# Patient Record
Sex: Female | Born: 1935 | Race: White | Hispanic: No | Smoking: Never smoker
Health system: Southern US, Community
[De-identification: ages and names within clinical notes are randomized; demographics above are authoritative.]

## PROBLEM LIST (undated history)

## (undated) DIAGNOSIS — K219 Gastro-esophageal reflux disease without esophagitis: Secondary | ICD-10-CM

## (undated) DIAGNOSIS — E785 Hyperlipidemia, unspecified: Secondary | ICD-10-CM

## (undated) DIAGNOSIS — J449 Chronic obstructive pulmonary disease, unspecified: Secondary | ICD-10-CM

## (undated) DIAGNOSIS — I4891 Unspecified atrial fibrillation: Secondary | ICD-10-CM

## (undated) DIAGNOSIS — M1990 Unspecified osteoarthritis, unspecified site: Secondary | ICD-10-CM

## (undated) DIAGNOSIS — T8859XA Other complications of anesthesia, initial encounter: Secondary | ICD-10-CM

## (undated) DIAGNOSIS — E119 Type 2 diabetes mellitus without complications: Secondary | ICD-10-CM

## (undated) DIAGNOSIS — H5440 Blindness, one eye, unspecified eye: Secondary | ICD-10-CM

## (undated) DIAGNOSIS — D649 Anemia, unspecified: Secondary | ICD-10-CM

## (undated) DIAGNOSIS — T4145XA Adverse effect of unspecified anesthetic, initial encounter: Secondary | ICD-10-CM

## (undated) DIAGNOSIS — G459 Transient cerebral ischemic attack, unspecified: Secondary | ICD-10-CM

## (undated) DIAGNOSIS — H9190 Unspecified hearing loss, unspecified ear: Secondary | ICD-10-CM

## (undated) DIAGNOSIS — S329XXA Fracture of unspecified parts of lumbosacral spine and pelvis, initial encounter for closed fracture: Secondary | ICD-10-CM

## (undated) DIAGNOSIS — N189 Chronic kidney disease, unspecified: Secondary | ICD-10-CM

## (undated) DIAGNOSIS — M4800 Spinal stenosis, site unspecified: Secondary | ICD-10-CM

## (undated) DIAGNOSIS — Z9981 Dependence on supplemental oxygen: Secondary | ICD-10-CM

## (undated) DIAGNOSIS — J189 Pneumonia, unspecified organism: Secondary | ICD-10-CM

## (undated) DIAGNOSIS — R1310 Dysphagia, unspecified: Secondary | ICD-10-CM

## (undated) HISTORY — PX: TONSILLECTOMY: SUR1361

## (undated) HISTORY — PX: OTHER SURGICAL HISTORY: SHX169

## (undated) HISTORY — PX: ABDOMINAL HYSTERECTOMY: SHX81

---

## 2013-02-05 DIAGNOSIS — H179 Unspecified corneal scar and opacity: Secondary | ICD-10-CM | POA: Insufficient documentation

## 2013-02-05 DIAGNOSIS — H18459 Nodular corneal degeneration, unspecified eye: Secondary | ICD-10-CM | POA: Insufficient documentation

## 2013-02-05 DIAGNOSIS — M316 Other giant cell arteritis: Secondary | ICD-10-CM | POA: Insufficient documentation

## 2013-05-01 DIAGNOSIS — M47817 Spondylosis without myelopathy or radiculopathy, lumbosacral region: Secondary | ICD-10-CM | POA: Insufficient documentation

## 2013-05-28 DIAGNOSIS — M810 Age-related osteoporosis without current pathological fracture: Secondary | ICD-10-CM | POA: Insufficient documentation

## 2013-09-12 ENCOUNTER — Other Ambulatory Visit (HOSPITAL_COMMUNITY): Payer: Self-pay | Admitting: Family Medicine

## 2013-09-12 DIAGNOSIS — R1310 Dysphagia, unspecified: Secondary | ICD-10-CM

## 2013-09-18 ENCOUNTER — Ambulatory Visit (HOSPITAL_COMMUNITY)
Admission: RE | Admit: 2013-09-18 | Discharge: 2013-09-18 | Disposition: A | Discharge status: Home / Self Care | Payer: Medicare Other | Source: Ambulatory Visit | Attending: Family Medicine | Admitting: Family Medicine

## 2013-09-18 DIAGNOSIS — R1312 Dysphagia, oropharyngeal phase: Secondary | ICD-10-CM | POA: Diagnosis not present

## 2013-09-18 DIAGNOSIS — R1313 Dysphagia, pharyngeal phase: Secondary | ICD-10-CM | POA: Diagnosis not present

## 2013-09-18 DIAGNOSIS — R1311 Dysphagia, oral phase: Secondary | ICD-10-CM | POA: Diagnosis not present

## 2013-09-18 DIAGNOSIS — R1310 Dysphagia, unspecified: Secondary | ICD-10-CM

## 2013-09-18 NOTE — Procedures (Signed)
Objective Swallowing Evaluation: Modified Barium Swallowing Study  Patient Details  Name: John Vasconcelos MRN: [id number] Date of Birth: 31-Mar-1925  Today's Date: 09/18/2013 Time: [id number] SLP Time Calculation (min): 24 min  Past Medical History: No past medical history on file. Past Surgical History: No past surgical history on file. HPI:  80 yo female with PMH significant for TIA, PNA, ventilator dependency (now overnight). She has been working on her oropharyngeal swallow with SLP at her ALF, and comes today hoping that she can upgrade her solids.     Assessment / Plan / Recommendation Clinical Impression  Dysphagia Diagnosis: Mild oral phase dysphagia;Mild pharyngeal phase dysphagia (suspect esophageal component) Clinical impression: Pt presents with a mild sensorimotor based oropharyngeal dysphagia with impaired mastication and reduced posterior transit with more solid PO including barium tablet. Pt demonstrated a mild delay in swallow initiation that primarily triggered at the valleculae except with thin liquids via straw, which spilled to the pyriform sinuses and was silently penetrated. Silent aspiration of thin liquids occurred during mixed consistency containing barium tablet and pill, with cough ineffective at clearing small amount of aspirates. Pt appeared to have slow transit through the esophagus, although MD was not present to confirm. Recommend to continue Dys 3 textures and thin liquids without straws, with meds crushed in puree. Pt and son verbalized their concern re: shredded meats at the ALF being too difficult to masticate; pt will likely require continued f/u with treating SLP to facilitate appropriate textures with the kitchen. Recommendations include diced or chopped meat instead of shredded and utilization of sauces/gravies to soften foods.    Treatment Recommendation       Diet Recommendation Dysphagia 3 (Mechanical Soft);Thin liquid   Liquid Administration via: Cup;No  straw Medication Administration: Crushed with puree Supervision: Patient able to self feed;Intermittent supervision to cue for compensatory strategies Compensations: Slow rate;Small sips/bites;Follow solids with liquid Postural Changes and/or Swallow Maneuvers: Seated upright 90 degrees;Upright 30-60 min after meal    Other  Recommendations Oral Care Recommendations: Oral care BID   Follow Up Recommendations  Other (comment) (continued f/u with current SLP)    Frequency and Duration        Pertinent Vitals/Pain n/a    SLP Swallow Goals     General Date of Onset:  (since end of April, beginning of May) HPI: 80 yo female with PMH significant for TIA, PNA, ventilator dependency (now overnight). She has been working on her oropharyngeal swallow with SLP at her ALF, and comes today hoping that she can upgrade her solids. Type of Study: Modified Barium Swallowing Study Reason for Referral: Objectively evaluate swallowing function Previous Swallow Assessment: Pt/son report FEES at [facility] ~6 weeks ago, and they do report that she had previously been on pureed food there Diet Prior to this Study: Dysphagia 3 (soft);Thin liquids;Other (Comment) (with ground or shredded meats) Respiratory Status: Room air History of Recent Intubation:  (intubated beginning of May with PNA) Behavior/Cognition: Alert;Cooperative;Pleasant mood;Requires cueing Oral Cavity - Dentition: Adequate natural dentition Oral Motor / Sensory Function: Impaired motor Oral impairment: Other (Comment) (difficulty with mastication) Self-Feeding Abilities: Able to feed self Patient Positioning: Upright in chair Baseline Vocal Quality: Clear Volitional Cough: Strong Volitional Swallow: Able to elicit Anatomy: Within functional limits Pharyngeal Secretions: Not observed secondary MBS    Reason for Referral Objectively evaluate swallowing function   Oral Phase Oral Preparation/Oral Phase Oral Phase: Impaired Oral -  Thin Oral - Thin Cup: Within functional limits Oral - Thin Straw: Within functional limits Oral -  Solids Oral - Puree: Within functional limits Oral - Mechanical Soft: Impaired mastication;Reduced posterior propulsion;Delayed oral transit Oral - Pill: Reduced posterior propulsion;Delayed oral transit   Pharyngeal Phase Pharyngeal Phase Pharyngeal Phase: Impaired Pharyngeal - Thin Pharyngeal - Thin Cup: Delayed swallow initiation;Premature spillage to valleculae;Reduced tongue base retraction;Penetration/Aspiration during swallow;Pharyngeal residue - valleculae Penetration/Aspiration details (thin cup): Material enters airway, remains ABOVE vocal cords then ejected out Pharyngeal - Thin Straw: Delayed swallow initiation;Premature spillage to pyriform sinuses;Reduced tongue base retraction;Penetration/Aspiration during swallow;Pharyngeal residue - valleculae Penetration/Aspiration details (thin straw): Material enters airway, passes BELOW cords without attempt by patient to eject out (silent aspiration) Pharyngeal - Solids Pharyngeal - Puree: Delayed swallow initiation;Reduced tongue base retraction;Pharyngeal residue - valleculae;Compensatory strategies attempted (Comment) (chin tuck not effective at reducing residuals) Pharyngeal - Mechanical Soft: Delayed swallow initiation;Reduced tongue base retraction;Pharyngeal residue - valleculae Pharyngeal - Pill: Delayed swallow initiation;Reduced tongue base retraction;Pharyngeal residue - valleculae;Other (Comment) (needed puree to clear pill from valleculae)  Cervical Esophageal Phase    GO    Cervical Esophageal Phase Cervical Esophageal Phase:  (appeared to have slow transit in esophagus (MD not present))    Functional Assessment Tool Used: skilled clinical judgment Functional Limitations: Swallowing Swallow Current Status ([id number]): At least 20 percent but less than 40 percent impaired, limited or restricted Swallow Goal Status [phone]):  At least 20 percent but less than 40 percent impaired, limited or restricted Swallow Discharge Status [phone]([id number]): At least 20 percent but less than 40 percent impaired, limited or restricted      Maxcine HamLaura Paiewonsky, M.A. CCC-SLP [phone]  Maxcine Hamaiewonsky, Yuridia Couts 09/18/2013, 12:32 PM

## 2013-11-21 DIAGNOSIS — J189 Pneumonia, unspecified organism: Secondary | ICD-10-CM

## 2013-11-21 HISTORY — DX: Pneumonia, unspecified organism: J18.9

## 2013-12-26 DIAGNOSIS — J690 Pneumonitis due to inhalation of food and vomit: Secondary | ICD-10-CM | POA: Insufficient documentation

## 2014-04-12 ENCOUNTER — Emergency Department (HOSPITAL_BASED_OUTPATIENT_CLINIC_OR_DEPARTMENT_OTHER)
Admission: EM | Admit: 2014-04-12 | Discharge: 2014-04-13 | Disposition: A | Discharge status: Other Acute Inpatient Hospital | Payer: Medicare Other | Attending: Emergency Medicine | Admitting: Emergency Medicine

## 2014-04-12 ENCOUNTER — Encounter (HOSPITAL_BASED_OUTPATIENT_CLINIC_OR_DEPARTMENT_OTHER): Payer: Self-pay | Admitting: Emergency Medicine

## 2014-04-12 ENCOUNTER — Emergency Department (HOSPITAL_BASED_OUTPATIENT_CLINIC_OR_DEPARTMENT_OTHER): Payer: Medicare Other

## 2014-04-12 DIAGNOSIS — A419 Sepsis, unspecified organism: Secondary | ICD-10-CM

## 2014-04-12 DIAGNOSIS — N390 Urinary tract infection, site not specified: Secondary | ICD-10-CM

## 2014-04-12 DIAGNOSIS — R531 Weakness: Secondary | ICD-10-CM | POA: Diagnosis present

## 2014-04-12 DIAGNOSIS — H9190 Unspecified hearing loss, unspecified ear: Secondary | ICD-10-CM | POA: Diagnosis not present

## 2014-04-12 DIAGNOSIS — Z8701 Personal history of pneumonia (recurrent): Secondary | ICD-10-CM | POA: Insufficient documentation

## 2014-04-12 DIAGNOSIS — A499 Bacterial infection, unspecified: Secondary | ICD-10-CM

## 2014-04-12 HISTORY — DX: Unspecified hearing loss, unspecified ear: H91.90

## 2014-04-12 HISTORY — DX: Pneumonia, unspecified organism: J18.9

## 2014-04-12 LAB — BASIC METABOLIC PANEL
ANION GAP: 6 (ref 5–15)
BUN: 32 mg/dL — ABNORMAL HIGH (ref 6–23)
CO2: 23 mmol/L (ref 19–32)
Calcium: 8.3 mg/dL — ABNORMAL LOW (ref 8.4–10.5)
Chloride: 102 mmol/L (ref 96–112)
Creatinine, Ser: 1.3 mg/dL — ABNORMAL HIGH (ref 0.50–1.10)
GFR calc non Af Amer: 36 mL/min — ABNORMAL LOW (ref >90)
GFR, EST AFRICAN AMERICAN: 42 mL/min — AB (ref >90)
GLUCOSE: 116 mg/dL — AB (ref 70–99)
Potassium: 3.6 mmol/L (ref 3.5–5.1)
Sodium: 131 mmol/L — ABNORMAL LOW (ref 135–145)

## 2014-04-12 LAB — URINALYSIS, ROUTINE W REFLEX MICROSCOPIC
Glucose, UA: NEGATIVE mg/dL
KETONES UR: 15 mg/dL — AB
Nitrite: POSITIVE — AB
Protein, ur: >300 mg/dL — AB
Specific Gravity, Urine: 1.023 (ref 1.005–1.030)
UROBILINOGEN UA: 1 mg/dL (ref 0.0–1.0)
pH: 7 (ref 5.0–8.0)

## 2014-04-12 LAB — CBC WITH DIFFERENTIAL/PLATELET
Band Neutrophils: 3 % (ref 0–10)
Basophils Absolute: 0 10*3/uL (ref 0.0–0.1)
Basophils Relative: 0 % (ref 0–1)
Blasts: 0 %
Eosinophils Absolute: 0 10*3/uL (ref 0.0–0.7)
Eosinophils Relative: 0 % (ref 0–5)
HCT: 36.2 % (ref 36.0–46.0)
Hemoglobin: 11.7 g/dL — ABNORMAL LOW (ref 12.0–15.0)
LYMPHS ABS: 1.9 10*3/uL (ref 0.7–4.0)
Lymphocytes Relative: 11 % — ABNORMAL LOW (ref 12–46)
MCH: 31 pg (ref 26.0–34.0)
MCHC: 32.3 g/dL (ref 30.0–36.0)
MCV: 96 fL (ref 78.0–100.0)
MONOS PCT: 12 % (ref 3–12)
Metamyelocytes Relative: 0 %
Monocytes Absolute: 2.1 10*3/uL — ABNORMAL HIGH (ref 0.1–1.0)
Myelocytes: 0 %
Neutro Abs: 13.4 10*3/uL — ABNORMAL HIGH (ref 1.7–7.7)
Neutrophils Relative %: 74 % (ref 43–77)
PLATELETS: 107 10*3/uL — AB (ref 150–400)
Promyelocytes Absolute: 0 %
RBC: 3.77 MIL/uL — ABNORMAL LOW (ref 3.87–5.11)
RDW: 16.8 % — AB (ref 11.5–15.5)
WBC: 17.4 10*3/uL — AB (ref 4.0–10.5)
nRBC: 0 /100 WBC

## 2014-04-12 LAB — URINE MICROSCOPIC-ADD ON

## 2014-04-12 MED ORDER — DEXTROSE 5 % IV SOLN
1.0000 g | Freq: Once | INTRAVENOUS | Status: AC
  Administered 2014-04-13: 1 g via INTRAVENOUS

## 2014-04-12 MED ORDER — ACETAMINOPHEN 160 MG/5ML PO SOLN
650.0000 mg | Freq: Once | ORAL | Status: AC
  Administered 2014-04-12: 650 mg via ORAL
  Filled 2014-04-12: qty 20.3

## 2014-04-12 MED ORDER — SODIUM CHLORIDE 0.9 % IV SOLN
Freq: Once | INTRAVENOUS | Status: AC
  Administered 2014-04-12: via INTRAVENOUS

## 2014-04-12 NOTE — ED Notes (Signed)
Patient's son states she is on o2 at night on 2 ltrs., and as needed throughout day.

## 2014-04-12 NOTE — ED Notes (Signed)
Back from xray

## 2014-04-12 NOTE — ED Notes (Signed)
I placed cannula on patient and tape on O2 pulse ox. Probe. I get best reading of 91% O2 so I raised to 3 ltrs. Oxygen by cannula. Patient's son stated they facility where patient lives took temp and hour ago, and was getting 104.0 axillary.

## 2014-04-12 NOTE — ED Notes (Signed)
D/c'd from hospital on 2/9 after pneumonia. Seen by PCP this week and was informed Tuesday that "blood work was OK". Has appointment with pulmonologist on Monday. "Here tonight for general weakness. Concern for UTI". Pt is in [facility] and has been working with rehab and has been improving daily until today. Was using her walker to go to the b/r, became weak and fell back onto bottom. Hit R ankle on her her walker. R medial ankle bruise noted. C/o "usual chronic tailbone pain".  Alert, NAD, calm, interactive, resps e/u, shallow. (denies: cough, fever, nvd or dizziness).

## 2014-04-12 NOTE — ED Notes (Signed)
Son reports mother had fallen about 45 min ago pt denies pain from fall but reports chronic pain in buttocks unchanged. [facility] is [facility]

## 2014-04-12 NOTE — ED Provider Notes (Addendum)
CSN: [id number]     Arrival date & time 04/12/14  2106 History  This chart was scribed for Hanley Seamen, MD by Jarvis Morgan, ED Scribe. This patient was seen in room MH03/MH03 and the patient's care was started at 11:58 PM.     Chief Complaint  Patient presents with  . Weakness    The history is provided by the patient and a relative. No language interpreter was used.    HPI Comments: Julie Richardson is a 81 y.o. female who presents to the Emergency Department complaining of generalized weakness which causes a fall earlier today. Son state she was at her assisted living facility and got up to go the bathroom and became weak fell. Son has a concern UTI as she has been having urinary urgency, voiding small amounts of urine, and low grade fever. Pt was hospitalized on 04/01/14 for pneumonia and it currently taking azithromycin per son. She has also been having a cough and occasional vomiting. She has appt with pulmonologist on Monday. She denies any lightheadeness   Past Medical History  Diagnosis Date  . Pneumonia   . HOH (hard of hearing)    History reviewed. No pertinent past surgical history. History reviewed. No pertinent family history. History  Substance Use Topics  . Smoking status: Never Smoker   . Smokeless tobacco: Never Used  . Alcohol Use: No   OB History    No data available     Review of Systems  All other systems reviewed and are negative.     Allergies  Coumadin and Sulfa antibiotics  Home Medications   Prior to Admission medications   Not on File   Triage Vitals: BP 107/68 mmHg  Pulse 106  Temp(Src) 98.7 F (37.1 C) (Oral)  Resp 20  Ht  (1.499 m)  Wt 97 lb (43.999 kg)  BMI 19.58 kg/m2  SpO2 97%   Physical Exam  Nursing note and vitals reviewed. General: Well-developed, well-nourished female in no acute distress; appearance consistent with age of record HENT: normocephalic; resolving hermatoma or left supraorbital region. Right pupil PERRL.  Left pupil fixed. Right lens inplant. Extraocular muscles appear intact Eyes: pupils equal, round and reactive to light; extraocular muscles intact Neck: supple Heart: regular rate and rhythm Lungs: clear to auscultation bilaterally. Basilar rales Abdomen: soft; nondistended; nontender; no masses or hepatosplenomegaly; bowel sounds present Extremities: No deformity; full range of motion; pulses weak Neurologic: Awake, alert and oriented; motor function intact in all extremities and symmetric; no facial droop Skin: Warm and dry. Chronic appearing hyperpigmentation and hyperkeratosis of lower legs Psychiatric: Normal mood and affect  ED Course  Procedures (including critical care time)  DIAGNOSTIC STUDIES: Oxygen Saturation is 97% on RA, normal by my interpretation.    COORDINATION OF CARE: 12:07 AM- Will order CXR and diagnostic lab work. Pt advised of plan for treatment and pt agrees.   CRITICAL CARE Performed by: Paula Libra L Total critical care time: 40 minutes Critical care time was exclusive of separately billable procedures and treating other patients. Critical care was necessary to treat or prevent imminent or life-threatening deterioration. Critical care was time spent personally by me on the following activities: development of treatment plan with patient and/or surrogate as well as nursing, discussions with consultants, evaluation of patient's response to treatment, examination of patient, obtaining history from patient or surrogate, ordering and performing treatments and interventions, ordering and review of laboratory studies, ordering and review of radiographic studies, pulse oximetry and re-evaluation of patient's  condition.    MDM   Nursing notes and vitals signs, including pulse oximetry, reviewed.  Summary of this visit's results, reviewed by myself:  Labs:  Results for orders placed or performed during the hospital encounter of 04/12/14 (from the past 24  hour(s))  CBC with Differential     Status: Abnormal   Collection Time: 04/12/14 10:39 PM  Result Value Ref Range   WBC 17.4 (H) 4.0 - 10.5 K/uL   RBC 3.77 (L) 3.87 - 5.11 MIL/uL   Hemoglobin 11.7 (L) 12.0 - 15.0 g/dL   HCT 82.936.2 56.236.0 - 13.046.0 %   MCV 96.0 78.0 - 100.0 fL   MCH 31.0 26.0 - 34.0 pg   MCHC 32.3 30.0 - 36.0 g/dL   RDW 86.516.8 (H) 78.411.5 - 69.615.5 %   Platelets 107 (L) 150 - 400 K/uL   Neutrophils Relative % 74 43 - 77 %   Lymphocytes Relative 11 (L) 12 - 46 %   Monocytes Relative 12 3 - 12 %   Eosinophils Relative 0 0 - 5 %   Basophils Relative 0 0 - 1 %   Band Neutrophils 3 0 - 10 %   Metamyelocytes Relative 0 %   Myelocytes 0 %   Promyelocytes Absolute 0 %   Blasts 0 %   nRBC 0 0 /100 WBC   Neutro Abs 13.4 (H) 1.7 - 7.7 K/uL   Lymphs Abs 1.9 0.7 - 4.0 K/uL   Monocytes Absolute 2.1 (H) 0.1 - 1.0 K/uL   Eosinophils Absolute 0.0 0.0 - 0.7 K/uL   Basophils Absolute 0.0 0.0 - 0.1 K/uL   WBC Morphology TOXIC GRANULATION    Smear Review LARGE PLATELETS PRESENT   Basic metabolic panel     Status: Abnormal   Collection Time: 04/12/14 10:39 PM  Result Value Ref Range   Sodium 131 (L) 135 - 145 mmol/L   Potassium 3.6 3.5 - 5.1 mmol/L   Chloride 102 96 - 112 mmol/L   CO2 23 19 - 32 mmol/L   Glucose, Bld 116 (H) 70 - 99 mg/dL   BUN 32 (H) 6 - 23 mg/dL   Creatinine, Ser 2.951.30 (H) 0.50 - 1.10 mg/dL   Calcium 8.3 (L) 8.4 - 10.5 mg/dL   GFR calc non Af Amer 36 (L) >90 mL/min   GFR calc Af Amer 42 (L) >90 mL/min   Anion gap 6 5 - 15  Urinalysis, Routine w reflex microscopic     Status: Abnormal   Collection Time: 04/12/14 10:40 PM  Result Value Ref Range   Color, Urine RED (A) YELLOW   APPearance TURBID (A) CLEAR   Specific Gravity, Urine 1.023 1.005 - 1.030   pH 7.0 5.0 - 8.0   Glucose, UA NEGATIVE NEGATIVE mg/dL   Hgb urine dipstick LARGE (A) NEGATIVE   Bilirubin Urine LARGE (A) NEGATIVE   Ketones, ur 15 (A) NEGATIVE mg/dL   Protein, ur >284>300 (A) NEGATIVE mg/dL    Urobilinogen, UA 1.0 0.0 - 1.0 mg/dL   Nitrite POSITIVE (A) NEGATIVE   Leukocytes, UA LARGE (A) NEGATIVE  Urine microscopic-add on     Status: Abnormal   Collection Time: 04/12/14 10:40 PM  Result Value Ref Range   WBC, UA TOO NUMEROUS TO COUNT <3 WBC/hpf   RBC / HPF TOO NUMEROUS TO COUNT <3 RBC/hpf   Bacteria, UA MANY (A) RARE  I-Stat CG4 Lactic Acid, ED     Status: Abnormal   Collection Time: 04/13/14 12:35 AM  Result Value  Ref Range   Lactic Acid, Venous 3.23 (HH) 0.5 - 2.0 mmol/L   Comment NOTIFIED PHYSICIAN   I-Stat CG4 Lactic Acid, ED     Status: None   Collection Time: 04/13/14  3:19 AM  Result Value Ref Range   Lactic Acid, Venous 1.32 0.5 - 2.0 mmol/L    Imaging Studies: Dg Chest 2 View  04/12/2014   CLINICAL DATA:  D/c'd on 2/9 from hospital after pneumonia. General weakness. Fell back onto bottom today due to weakness. Hx: Pneumonia  EXAM: CHEST  2 VIEW  COMPARISON:  03/28/2014  FINDINGS: Areas of opacity in bronchial wall thickening in the medial lung bases are improved from the prior exam.  There are no areas of lung consolidation. Mild bilateral interstitial thickening and areas of mild reticular scarring are stable. No pulmonary edema.  Cardiac silhouette is mildly enlarged. The aorta is uncoiled. No mediastinal or hilar masses.  No pleural effusion or pneumothorax.  Mild compression fractures noted of a mid and a lower thoracic vertebrae stable from the recent prior chest CT.  IMPRESSION: No acute findings. Lung base infiltrates noted previously appear improved. Residual opacity is likely combination of scarring, atelectasis and chronic bronchitic change. No new abnormalities.   Electronically Signed   By: Amie Portland M.D.   On: 04/12/2014 23:48   3:31 AM Pressure improved and lactate has normalized after IV fluid hydration. Rocephin given for urinary tract infection. Dr. Lucretia Roers accepts for admission to [facility]. Suspect early urosepsis.  I personally  performed the services described in this documentation, which was scribed in my presence. The recorded information has been reviewed and is accurate.   Hanley Seamen, MD 04/13/14 1610  Hanley Seamen, MD 04/13/14 9604  Hanley Seamen, MD 04/13/14 [phone]

## 2014-04-13 DIAGNOSIS — R531 Weakness: Secondary | ICD-10-CM | POA: Diagnosis present

## 2014-04-13 DIAGNOSIS — H9190 Unspecified hearing loss, unspecified ear: Secondary | ICD-10-CM | POA: Diagnosis not present

## 2014-04-13 DIAGNOSIS — A419 Sepsis, unspecified organism: Secondary | ICD-10-CM | POA: Diagnosis not present

## 2014-04-13 DIAGNOSIS — N390 Urinary tract infection, site not specified: Secondary | ICD-10-CM | POA: Diagnosis not present

## 2014-04-13 DIAGNOSIS — Z8701 Personal history of pneumonia (recurrent): Secondary | ICD-10-CM | POA: Diagnosis not present

## 2014-04-13 LAB — I-STAT CG4 LACTIC ACID, ED
LACTIC ACID, VENOUS: 3.23 mmol/L — AB (ref 0.5–2.0)
Lactic Acid, Venous: 1.32 mmol/L (ref 0.5–2.0)

## 2014-04-13 MED ORDER — SODIUM CHLORIDE 0.9 % IV BOLUS (SEPSIS)
1000.0000 mL | Freq: Once | INTRAVENOUS | Status: AC
  Administered 2014-04-13: 500 mL via INTRAVENOUS

## 2014-04-13 MED ORDER — CEFTRIAXONE SODIUM 1 G IJ SOLR
INTRAMUSCULAR | Status: AC
  Filled 2014-04-13: qty 10

## 2014-04-13 NOTE — ED Notes (Signed)
No changes, [facility] here for transport, son present.

## 2014-04-15 LAB — URINE CULTURE

## 2014-04-16 ENCOUNTER — Telehealth (HOSPITAL_BASED_OUTPATIENT_CLINIC_OR_DEPARTMENT_OTHER): Payer: Self-pay | Admitting: Emergency Medicine

## 2014-04-16 NOTE — Telephone Encounter (Addendum)
Post ED Visit - Positive Culture Follow-up  Culture report reviewed by antimicrobial stewardship pharmacist: []  Wes Dulaney, Pharm.D., BCPS [x]  Celedonio MiyamotoJeremy Frens, Pharm.D., BCPS []  Georgina PillionElizabeth Martin, Pharm.D., BCPS []  KyleMinh Pham, Vermont[place]arm.D., BCPS, AAHIVP []  Estella HuskMichelle Turner, Pharm.D., BCPS, AAHIVP []  Elder CyphersLorie Poole, [address]Ph[address]D., BCPS  Positive urine culture E. coli Treated with none,  Pt [facility] [place] pharmacist aware, no further followup  Berle MullMiller, Seamus Warehime 04/16/2014, 10:14 AM

## 2014-04-26 ENCOUNTER — Encounter (HOSPITAL_BASED_OUTPATIENT_CLINIC_OR_DEPARTMENT_OTHER): Payer: Self-pay | Admitting: *Deleted

## 2014-04-26 ENCOUNTER — Emergency Department (HOSPITAL_BASED_OUTPATIENT_CLINIC_OR_DEPARTMENT_OTHER)
Admission: EM | Admit: 2014-04-26 | Discharge: 2014-04-26 | Disposition: A | Discharge status: Other Acute Inpatient Hospital | Payer: Medicare Other | Attending: Emergency Medicine | Admitting: Emergency Medicine

## 2014-04-26 ENCOUNTER — Emergency Department (HOSPITAL_BASED_OUTPATIENT_CLINIC_OR_DEPARTMENT_OTHER): Payer: Medicare Other

## 2014-04-26 DIAGNOSIS — N10 Acute tubulo-interstitial nephritis: Secondary | ICD-10-CM | POA: Diagnosis not present

## 2014-04-26 DIAGNOSIS — R4182 Altered mental status, unspecified: Secondary | ICD-10-CM | POA: Diagnosis not present

## 2014-04-26 DIAGNOSIS — Z792 Long term (current) use of antibiotics: Secondary | ICD-10-CM | POA: Insufficient documentation

## 2014-04-26 DIAGNOSIS — H9190 Unspecified hearing loss, unspecified ear: Secondary | ICD-10-CM | POA: Insufficient documentation

## 2014-04-26 DIAGNOSIS — W1830XA Fall on same level, unspecified, initial encounter: Secondary | ICD-10-CM | POA: Insufficient documentation

## 2014-04-26 DIAGNOSIS — R000 Tachycardia, unspecified: Secondary | ICD-10-CM | POA: Diagnosis not present

## 2014-04-26 DIAGNOSIS — Y9289 Other specified places as the place of occurrence of the external cause: Secondary | ICD-10-CM | POA: Diagnosis not present

## 2014-04-26 DIAGNOSIS — Z79899 Other long term (current) drug therapy: Secondary | ICD-10-CM | POA: Diagnosis not present

## 2014-04-26 DIAGNOSIS — Y9389 Activity, other specified: Secondary | ICD-10-CM | POA: Diagnosis not present

## 2014-04-26 DIAGNOSIS — Z8701 Personal history of pneumonia (recurrent): Secondary | ICD-10-CM | POA: Diagnosis not present

## 2014-04-26 DIAGNOSIS — A419 Sepsis, unspecified organism: Secondary | ICD-10-CM | POA: Diagnosis not present

## 2014-04-26 DIAGNOSIS — Y998 Other external cause status: Secondary | ICD-10-CM | POA: Diagnosis not present

## 2014-04-26 DIAGNOSIS — Z7952 Long term (current) use of systemic steroids: Secondary | ICD-10-CM | POA: Insufficient documentation

## 2014-04-26 DIAGNOSIS — Z043 Encounter for examination and observation following other accident: Secondary | ICD-10-CM | POA: Diagnosis present

## 2014-04-26 LAB — CBC WITH DIFFERENTIAL/PLATELET
BASOS ABS: 0.1 10*3/uL (ref 0.0–0.1)
BLASTS: 0 %
Band Neutrophils: 0 % (ref 0–10)
Basophils Relative: 1 % (ref 0–1)
Eosinophils Absolute: 0.1 10*3/uL (ref 0.0–0.7)
Eosinophils Relative: 1 % (ref 0–5)
HCT: 32.4 % — ABNORMAL LOW (ref 36.0–46.0)
HEMOGLOBIN: 9.7 g/dL — AB (ref 12.0–15.0)
LYMPHS ABS: 1 10*3/uL (ref 0.7–4.0)
LYMPHS PCT: 12 % (ref 12–46)
MCH: 29.1 pg (ref 26.0–34.0)
MCHC: 29.9 g/dL — AB (ref 30.0–36.0)
MCV: 97.3 fL (ref 78.0–100.0)
MONOS PCT: 15 % — AB (ref 3–12)
Metamyelocytes Relative: 0 %
Monocytes Absolute: 1.3 10*3/uL — ABNORMAL HIGH (ref 0.1–1.0)
Myelocytes: 0 %
Neutro Abs: 5.9 10*3/uL (ref 1.7–7.7)
Neutrophils Relative %: 71 % (ref 43–77)
PLATELETS: 193 10*3/uL (ref 150–400)
PROMYELOCYTES ABS: 0 %
RBC: 3.33 MIL/uL — ABNORMAL LOW (ref 3.87–5.11)
RDW: 16.4 % — AB (ref 11.5–15.5)
WBC: 8.4 10*3/uL (ref 4.0–10.5)
nRBC: 0 /100 WBC

## 2014-04-26 LAB — URINALYSIS, ROUTINE W REFLEX MICROSCOPIC
Bilirubin Urine: NEGATIVE
Glucose, UA: NEGATIVE mg/dL
Ketones, ur: 15 mg/dL — AB
Nitrite: POSITIVE — AB
Protein, ur: >300 mg/dL — AB
Specific Gravity, Urine: 1.021 (ref 1.005–1.030)
Urobilinogen, UA: 0.2 mg/dL (ref 0.0–1.0)
pH: 5.5 (ref 5.0–8.0)

## 2014-04-26 LAB — COMPREHENSIVE METABOLIC PANEL
ALBUMIN: 3.4 g/dL — AB (ref 3.5–5.2)
ALT: 27 U/L (ref 0–35)
AST: 41 U/L — ABNORMAL HIGH (ref 0–37)
Alkaline Phosphatase: 117 U/L (ref 39–117)
Anion gap: 6 (ref 5–15)
BUN: 28 mg/dL — ABNORMAL HIGH (ref 6–23)
CALCIUM: 8.6 mg/dL (ref 8.4–10.5)
CHLORIDE: 104 mmol/L (ref 96–112)
CO2: 25 mmol/L (ref 19–32)
Creatinine, Ser: 0.75 mg/dL (ref 0.50–1.10)
GFR calc non Af Amer: 75 mL/min — ABNORMAL LOW (ref >90)
GFR, EST AFRICAN AMERICAN: 86 mL/min — AB (ref >90)
GLUCOSE: 133 mg/dL — AB (ref 70–99)
POTASSIUM: 4.2 mmol/L (ref 3.5–5.1)
Sodium: 135 mmol/L (ref 135–145)
TOTAL PROTEIN: 7.9 g/dL (ref 6.0–8.3)
Total Bilirubin: 1.2 mg/dL (ref 0.3–1.2)

## 2014-04-26 LAB — URINE MICROSCOPIC-ADD ON

## 2014-04-26 LAB — TROPONIN I: TROPONIN I: 0.04 ng/mL — AB (ref <0.031)

## 2014-04-26 LAB — AMMONIA: Ammonia: 23 umol/L (ref 11–32)

## 2014-04-26 LAB — I-STAT CG4 LACTIC ACID, ED: LACTIC ACID, VENOUS: 2.32 mmol/L — AB (ref 0.5–2.0)

## 2014-04-26 LAB — CBG MONITORING, ED: GLUCOSE-CAPILLARY: 124 mg/dL — AB (ref 70–99)

## 2014-04-26 MED ORDER — VANCOMYCIN HCL IN DEXTROSE 1-5 GM/200ML-% IV SOLN
1000.0000 mg | Freq: Once | INTRAVENOUS | Status: AC
  Administered 2014-04-26: 1000 mg via INTRAVENOUS
  Filled 2014-04-26: qty 200

## 2014-04-26 MED ORDER — PIPERACILLIN-TAZOBACTAM 3.375 G IVPB 30 MIN
3.3750 g | Freq: Once | INTRAVENOUS | Status: AC
  Administered 2014-04-26: 3.375 g via INTRAVENOUS
  Filled 2014-04-26 (×2): qty 50

## 2014-04-26 MED ORDER — SODIUM CHLORIDE 0.9 % IV BOLUS (SEPSIS)
1000.0000 mL | Freq: Once | INTRAVENOUS | Status: AC
  Administered 2014-04-26: 1000 mL via INTRAVENOUS

## 2014-04-26 MED ORDER — ACETAMINOPHEN 325 MG PO TABS
650.0000 mg | ORAL_TABLET | Freq: Once | ORAL | Status: AC
  Administered 2014-04-26: 650 mg via ORAL
  Filled 2014-04-26: qty 2

## 2014-04-26 NOTE — ED Notes (Signed)
Pt had unwitness fall this am  Pt denies inj or pain ( Hematoma over left eye is old)  No loc,  Also has had n/v/d x 2 days per pt   Per nursing home staff pt is at baseline

## 2014-04-26 NOTE — ED Notes (Signed)
Arrived ems w c/o unwitnessed fall,  Denies loc, denies pain  Has had n/v/d x 2 days

## 2014-04-26 NOTE — ED Provider Notes (Signed)
CSN: [id number]     Arrival date & time 04/26/14  5784 History   First MD Initiated Contact with Patient 04/26/14 [phone]     Chief Complaint  Patient presents with  . Fall     (Consider location/radiation/quality/duration/timing/severity/associated sxs/prior Treatment) Patient is a 81 y.o. female presenting with fall.  Fall This is a new problem. The current episode started 1 to 2 hours ago. The problem occurs constantly. The problem has not changed since onset.Pertinent negatives include no chest pain, no abdominal pain, no headaches and no shortness of breath. Nothing aggravates the symptoms. Nothing relieves the symptoms. She has tried nothing for the symptoms.    Past Medical History  Diagnosis Date  . Pneumonia   . HOH (hard of hearing)    History reviewed. No pertinent past surgical history. No family history on file. History  Substance Use Topics  . Smoking status: Never Smoker   . Smokeless tobacco: Never Used  . Alcohol Use: No   OB History    No data available     Review of Systems  Respiratory: Negative for shortness of breath.   Cardiovascular: Negative for chest pain.  Gastrointestinal: Negative for abdominal pain.  Neurological: Negative for headaches.  All other systems reviewed and are negative.     Allergies  Coumadin and Sulfa antibiotics  Home Medications   Prior to Admission medications   Medication Sig Start Date End Date Taking? Authorizing Provider  acetaminophen (TYLENOL) 325 MG tablet Take 650 mg by mouth every 6 (six) hours as needed.   Yes Historical Provider, MD  azithromycin (ZITHROMAX) 250 MG tablet Take by mouth daily.   Yes Historical Provider, MD  erythromycin ophthalmic ointment 1 application at bedtime.   Yes Historical Provider, MD  HYDROcodone-acetaminophen (NORCO/VICODIN) 5-325 MG per tablet Take 1 tablet by mouth every 6 (six) hours as needed for moderate pain.   Yes Historical Provider, MD  hydrocortisone (ANUSOL-HC) 2.5 %  rectal cream Place 1 application rectally 2 (two) times daily.   Yes Historical Provider, MD  iron polysaccharides (NIFEREX) 150 MG capsule Take 150 mg by mouth daily.   Yes Historical Provider, MD  pantoprazole (PROTONIX) 40 MG tablet Take 40 mg by mouth daily.   Yes Historical Provider, MD  predniSONE (DELTASONE) 5 MG tablet Take 5 mg by mouth daily with breakfast.   Yes Historical Provider, MD   BP 89/46 mmHg  Pulse 94  Temp(Src) 101.7 F (38.7 C) (Rectal)  Resp 18  Ht 5' (1.524 m)  Wt 100 lb (45.36 kg)  BMI 19.53 kg/m2  SpO2 94% Physical Exam  Constitutional: She appears well-developed and well-nourished.  HENT:  Head: Normocephalic and atraumatic.  Right Ear: External ear normal.  Left Ear: External ear normal.  Eyes: Conjunctivae and EOM are normal. Pupils are equal, round, and reactive to light.  Neck: Normal range of motion. Neck supple.  Cardiovascular: Regular rhythm, normal heart sounds and intact distal pulses.  Tachycardia present.   Pulmonary/Chest: Effort normal and breath sounds normal.  Abdominal: Soft. Bowel sounds are normal. There is no tenderness.  Musculoskeletal: Normal range of motion.  Neurological: She is alert.  No deficits in context of limited exam  Skin: Skin is warm and dry.  Vitals reviewed.   ED Course  Procedures (including critical care time) Labs Review Labs Reviewed  COMPREHENSIVE METABOLIC PANEL - Abnormal; Notable for the following:    Glucose, Bld 133 (*)    BUN 28 (*)    Albumin 3.4 (*)  AST 41 (*)    GFR calc non Af Amer 75 (*)    GFR calc Af Amer 86 (*)    All other components within normal limits  URINALYSIS, ROUTINE W REFLEX MICROSCOPIC - Abnormal; Notable for the following:    Color, Urine AMBER (*)    APPearance CLOUDY (*)    Hgb urine dipstick LARGE (*)    Ketones, ur 15 (*)    Protein, ur >300 (*)    Nitrite POSITIVE (*)    Leukocytes, UA MODERATE (*)    All other components within normal limits  TROPONIN I -  Abnormal; Notable for the following:    Troponin I 0.04 (*)    All other components within normal limits  URINE MICROSCOPIC-ADD ON - Abnormal; Notable for the following:    Bacteria, UA MANY (*)    All other components within normal limits  CBC WITH DIFFERENTIAL/PLATELET - Abnormal; Notable for the following:    RBC 3.33 (*)    Hemoglobin 9.7 (*)    HCT 32.4 (*)    MCHC 29.9 (*)    RDW 16.4 (*)    Monocytes Relative 15 (*)    Monocytes Absolute 1.3 (*)    All other components within normal limits  CBG MONITORING, ED - Abnormal; Notable for the following:    Glucose-Capillary 124 (*)    All other components within normal limits  I-STAT CG4 LACTIC ACID, ED - Abnormal; Notable for the following:    Lactic Acid, Venous 2.32 (*)    All other components within normal limits  URINE CULTURE  CULTURE, BLOOD (ROUTINE X 2)  CULTURE, BLOOD (ROUTINE X 2)  AMMONIA  CBC WITH DIFFERENTIAL/PLATELET  GI PATHOGEN PANEL BY PCR, STOOL    Imaging Review Dg Chest 2 View  04/26/2014   CLINICAL DATA:  Patient status post fall.  No known injury.  EXAM: CHEST  2 VIEW  COMPARISON:  Chest radiograph 04/12/2014  FINDINGS: Stable cardiac and mediastinal contours with tortuosity of the thoracic aorta. Unchanged right mid lung opacity. Persistent blunting of the right costophrenic angle. No definite pleural effusion or pneumothorax. Unchanged wedge compression deformity of multiple mid thoracic vertebral bodies.  IMPRESSION: Persistent right midlung opacity may represent resolving infiltrate or potentially scarring.  Re- demonstrated compression deformities of multiple mid and lower thoracic vertebral bodies.  Probable small right pleural effusion.   Electronically Signed   By: Annia Belt M.D.   On: 04/26/2014 09:39   Ct Head Wo Contrast  04/26/2014   CLINICAL DATA:  Fall today. Head injury. Confusion. Initial encounter.  EXAM: CT HEAD WITHOUT CONTRAST  TECHNIQUE: Contiguous axial images were obtained from the base  of the skull through the vertex without intravenous contrast.  COMPARISON:  03/28/2014.  11/08/2013.  FINDINGS: No mass lesion, mass effect, midline shift, hydrocephalus, hemorrhage. No acute territorial cortical ischemia/infarct. Atrophy and chronic ischemic white matter disease is present. Old lacunar infarct in the RIGHT caudate head. Dense benign basal ganglia calcifications. Dense atherosclerosis. Chronic paranasal sinus disease with complete opacification of the maxillary sinuses. Calcifications are present along with high density material, suggesting allergic fungal sinusitis.  Soft tissue swelling is present over the LEFT supraorbital region, possibly representing contusion. This could also represent scar tissue from prior injury.  IMPRESSION: 1. Atrophy and chronic ischemic white matter disease without acute intracranial abnormality. 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 2. Chronic paranasal sinus disease.   Electronically Signed   By: Andreas Newport M.D.   On: 04/26/2014 09:28  EKG Interpretation   Date/Time:  Saturday April 26 2014 07:46:01 EST Ventricular Rate:  120 PR Interval:  128 QRS Duration: 100 QT Interval:  354 QTC Calculation: 500 R Axis:   70 Text Interpretation:  Sinus tachycardia Incomplete right bundle branch  block ST \\T \ T wave abnormality, consider anterior ischemia Abnormal ECG  No old tracing to compare Confirmed by Mirian MoGentry, Matthew [phone]([id number]) on 04/26/2014  7:49:42 AM     CRITICAL CARE Performed by: Mirian MoGentry, Matthew   Total critical care time: 78 min  Critical care time was exclusive of separately billable procedures and treating other patients.  Critical care was necessary to treat or prevent imminent or life-threatening deterioration.  Critical care was time spent personally by me on the following activities: development of treatment plan with patient and/or surrogate as well as nursing, discussions with consultants, evaluation of patient's response to treatment,  examination of patient, obtaining history from patient or surrogate, ordering and performing treatments and interventions, ordering and review of laboratory studies, ordering and review of radiographic studies, pulse oximetry and re-evaluation of patient's condition.  MDM   Final diagnoses:  Sepsis, due to unspecified organism  Acute pyelonephritis    81 y.o. female with pertinent PMH of temporal arteritis, prior urosepsis admission presents with altered mental status from [facility].  She reportedly told staff she fell.  She has a baseline ho some altered mental status.  On arrival today pt is only oriented to name and year, cannot give a reason for being here.  No trauma on exam.   Wu with urosepsis, vanc/cefepime given.  Pt febrile, NS bolus given, HR improved.  Called for transfer to [facility]. I have reviewed all laboratory and imaging studies if ordered as above  1. Sepsis, due to unspecified organism   2. Acute pyelonephritis         Mirian MoMatthew Gentry, MD 04/27/14 [phone]

## 2014-04-26 NOTE — ED Notes (Signed)
Unable to obtain second blood culture, started antibiotice

## 2014-04-29 LAB — GI PATHOGEN PANEL BY PCR, STOOL
CAMPYLOBACTER BY PCR: NOT DETECTED
Cryptosporidium by PCR: NOT DETECTED
E COLI (STEC): NOT DETECTED
E COLI 0157 BY PCR: NOT DETECTED
E coli (ETEC) LT/ST: NOT DETECTED
G lamblia by PCR: NOT DETECTED
Rotavirus A by PCR: NOT DETECTED
Salmonella by PCR: NOT DETECTED
Shigella by PCR: NOT DETECTED

## 2014-04-29 LAB — URINE CULTURE: Colony Count: >=100000

## 2014-04-30 ENCOUNTER — Telehealth (HOSPITAL_BASED_OUTPATIENT_CLINIC_OR_DEPARTMENT_OTHER): Payer: Self-pay | Admitting: Emergency Medicine

## 2014-04-30 NOTE — Telephone Encounter (Signed)
Urine culture report 04/26/14 faxed to [facility] per ID pharmacist, pt is inpatient there, faxed to [phone] atten Irving BurtonEmily

## 2014-05-02 LAB — CULTURE, BLOOD (ROUTINE X 2): CULTURE: NO GROWTH

## 2014-08-27 ENCOUNTER — Other Ambulatory Visit: Payer: Self-pay | Admitting: Urology

## 2014-08-27 DIAGNOSIS — N824 Other female intestinal-genital tract fistulae: Secondary | ICD-10-CM

## 2014-09-15 ENCOUNTER — Encounter (HOSPITAL_COMMUNITY): Payer: Self-pay

## 2014-09-15 ENCOUNTER — Ambulatory Visit (HOSPITAL_COMMUNITY)
Admission: RE | Admit: 2014-09-15 | Discharge: 2014-09-15 | Disposition: A | Discharge status: Home / Self Care | Payer: Medicare Other | Source: Ambulatory Visit | Attending: Urology | Admitting: Urology

## 2014-09-15 DIAGNOSIS — N823 Fistula of vagina to large intestine: Secondary | ICD-10-CM | POA: Diagnosis not present

## 2014-09-15 DIAGNOSIS — K8020 Calculus of gallbladder without cholecystitis without obstruction: Secondary | ICD-10-CM | POA: Diagnosis not present

## 2014-09-15 DIAGNOSIS — J479 Bronchiectasis, uncomplicated: Secondary | ICD-10-CM | POA: Diagnosis not present

## 2014-09-15 DIAGNOSIS — N824 Other female intestinal-genital tract fistulae: Secondary | ICD-10-CM

## 2014-09-15 MED ORDER — IOHEXOL 300 MG/ML  SOLN
100.0000 mL | Freq: Once | INTRAMUSCULAR | Status: AC | PRN
  Administered 2014-09-15: 75 mL via INTRAVENOUS

## 2014-10-01 ENCOUNTER — Encounter: Payer: Self-pay | Admitting: Gastroenterology

## 2014-10-01 ENCOUNTER — Other Ambulatory Visit: Payer: Self-pay | Admitting: Surgery

## 2014-10-01 ENCOUNTER — Encounter: Payer: Self-pay | Admitting: Surgery

## 2014-10-01 NOTE — H&P (Signed)
Julie Richardson 10/01/2014 10:40 AM Location: [facility] Patient #: [id number] DOB: Apr 02, 1925 Married / Language: Undefined / Race: Undefined Female History of Present Illness Ardeth Sportsman MD; 10/01/2014 1:16 PM) Patient words: vag. fistula.  The patient is a 81 year old female who presents with colovaginal fistula. Patient sent for a surgical consultation requested by Dr. Lorin Picket McDiarmid with [facility] for concern of colovaginal fistula. Pleasant elderly woman. Uses a walker for balance but otherwise active. History of urinary tract infections. At least 3 times this year. First time ever. Patient developed feculent drainage from her vagina earlier this year. Workup concerning for a colovaginal fistula. Discussed with urology. He recommended considering surgical repair the patient interested. Because it is annoying and frustrating for her, she wishes to be aggressive at least consider surgery. She comes today with her son. They're usually from [facility] area. Patient normally has a bowel movement a few times a day. No severe bouts of constipation and diarrhea. However since her fistula development output has been moderate. Changing dressing a few times a day. No history of heart attacks or strokes. She is nonicteric blood thinners. She cannot see out of her left eye but otherwise is rather functional and active. History of fall trauma. No history of Crohn's or colitis or inflammatory bowel disease. No strong family history of rectal cancer that she knows of. She's never had a colonoscopy. Other Problems Fay Records, CMA; 10/01/2014 10:41 AM) Atrial Fibrillation Back Pain Cerebrovascular Accident Diabetes Mellitus Gastric Ulcer Home Oxygen Use Hypercholesterolemia Oophorectomy Bilateral. Transfusion history  Past Surgical History Fay Records, CMA; 10/01/2014 10:41 AM) Cataract Surgery Right. Hysterectomy (not due to cancer) -  Complete Shoulder Surgery Right. Tonsillectomy  Diagnostic Studies History Fay Records, [place]; 10/01/2014 10:41 AM) Colonoscopy never Mammogram >3 years ago Pap Smear 1-5 years ago  Allergies Fay Records, CMA; 10/01/2014 10:42 AM) Coumarin *CHEMICALS* Sulfa 10 *OPHTHALMIC AGENTS*  Medication History Fay Records, CMA; 10/01/2014 10:43 AM) Sotalol HCl (  Tablet, Oral) Active. PredniSONE (  Tablet, Oral) Active. Omeprazole (  Capsule DR, Oral) Active. Mapap (  Tablet, Oral) Active. Ipratropium-Albuterol (0.5-2.5 (3)MG/3ML Solution, Inhalation) Active. Erythromycin ( /GM Ointment, Ophthalmic) Active. Cephalexin (  Capsule, Oral) Active. Atorvastatin Calcium (  Tablet, Oral) Active. Azithromycin (  Tablet, Oral) Active. Pantoprazole Sodium (  Tablet DR, Oral) Active. Medications Reconciled  Social History Fay Records, [place]; 10/01/2014 10:41 AM) Alcohol use Remotely quit alcohol use. Caffeine use Carbonated beverages, Tea. No drug use Tobacco use Never smoker.  Family History Fay Records, [place]; 10/01/2014 10:41 AM) Cancer Brother. Heart Disease Brother, Father, Mother, Son. Heart disease in female family member before age 29 Respiratory Condition Brother. Thyroid problems Mother.  Pregnancy / Birth History Fay Records, CMA; 10/01/2014 10:41 AM) Age at menarche 14 years. Age of menopause <47 Contraceptive History Oral contraceptives. Gravida 1 Maternal age 71-20 Para 1     Review of Systems Fay Records CMA; 10/01/2014 10:41 AM) General Not Present- Appetite Loss, Chills, Fatigue, Fever, Night Sweats, Weight Gain and Weight Loss. Skin Present- Dryness, Non-Healing Wounds and Ulcer. Not Present- Change in Wart/Mole, Hives, Jaundice, New Lesions and Rash. HEENT Present- Hearing Loss, Hoarseness and Wears glasses/contact lenses. Not Present- Earache, Nose Bleed, Oral Ulcers, Ringing in the Ears, Seasonal Allergies,  Sinus Pain, Sore Throat, Visual Disturbances and Yellow Eyes. Respiratory Present- Chronic Cough. Not Present- Bloody sputum, Difficulty Breathing, Snoring and Wheezing. Breast Not Present- Breast Mass, Breast Pain, Nipple Discharge and Skin Changes. Cardiovascular Not Present- Chest Pain, Difficulty Breathing Lying Down, Leg Cramps,  Palpitations, Rapid Heart Rate, Shortness of Breath and Swelling of Extremities. Gastrointestinal Present- Constipation and Difficulty Swallowing. Not Present- Abdominal Pain, Bloating, Bloody Stool, Change in Bowel Habits, Chronic diarrhea, Excessive gas, Gets full quickly at meals, Hemorrhoids, Indigestion, Nausea, Rectal Pain and Vomiting. Female Genitourinary Not Present- Frequency, Nocturia, Painful Urination, Pelvic Pain and Urgency. Musculoskeletal Present- Back Pain. Not Present- Joint Pain, Joint Stiffness, Muscle Pain, Muscle Weakness and Swelling of Extremities. Neurological Not Present- Decreased Memory, Fainting, Headaches, Numbness, Seizures, Tingling, Tremor, Trouble walking and Weakness. Psychiatric Not Present- Anxiety, Bipolar, Change in Sleep Pattern, Depression, Fearful and Frequent crying. Endocrine Not Present- Cold Intolerance, Excessive Hunger, Hair Changes, Heat Intolerance, Hot flashes and New Diabetes. Hematology Present- Easy Bruising, Excessive bleeding and Persistent Infections. Not Present- Gland problems and HIV.  Vitals Fay Records CMA; 10/01/2014 10:43 AM) 10/01/2014 10:43 AM Weight: 92.8 lb Height: 59in Body Surface Area: 1.32 m Body Mass Index: 18.74 kg/m Temp.: 98.56F(Oral)  Pulse: 68 (Regular)  Resp.: 16 (Unlabored)  BP: 124/60 (Sitting, Left Arm, Standard)     Physical Exam Ardeth Sportsman MD; 10/01/2014 11:30 AM)  General Mental Status-Alert. General Appearance-Not in acute distress, Not Sickly. Orientation-Oriented X3. Hydration-Well hydrated. Voice-Normal.  Integumentary Global  Assessment Upon inspection and palpation of skin surfaces of the - Axillae: non-tender, no inflammation or ulceration, no drainage. and Distribution of scalp and body hair is normal. General Characteristics Temperature - normal warmth is noted.  Head and Neck Head-normocephalic, atraumatic with no lesions or palpable masses. Face Global Assessment - atraumatic, no absence of expression. Neck Global Assessment - no abnormal movements, no bruit auscultated on the right, no bruit auscultated on the left, no decreased range of motion, non-tender. Trachea-midline. Thyroid Gland Characteristics - non-tender.  Eye Eyeball - Left-Extraocular movements intact, No Nystagmus. Eyeball - Right-Extraocular movements intact, No Nystagmus. Cornea - Left-No Hazy. Cornea - Right-No Hazy. Sclera/Conjunctiva - Left-No scleral icterus, No Discharge. Sclera/Conjunctiva - Right-No scleral icterus, No Discharge. Pupil - Left-Direct reaction to light normal. Pupil - Right-Direct reaction to light normal. Note: Cannot see out of her left eye.   ENMT Ears Pinna - Left - no drainage observed, no generalized tenderness observed. Right - no drainage observed, no generalized tenderness observed. Nose and Sinuses External Inspection of the Nose - no destructive lesion observed. Inspection of the nares - Left - quiet respiration. Right - quiet respiration. Mouth and Throat Lips - Upper Lip - no fissures observed, no pallor noted. Lower Lip - no fissures observed, no pallor noted. Nasopharynx - no discharge present. Oral Cavity/Oropharynx - Tongue - no dryness observed. Oral Mucosa - no cyanosis observed. Hypopharynx - no evidence of airway distress observed.  Chest and Lung Exam Inspection Movements - Normal and Symmetrical. Accessory muscles - No use of accessory muscles in breathing. Palpation Palpation of the chest reveals - Non-tender. Auscultation Breath sounds - Normal and  Clear.  Cardiovascular Auscultation Rhythm - Regular. Murmurs & Other Heart Sounds - Auscultation of the heart reveals - No Murmurs and No Systolic Clicks.  Abdomen Inspection Inspection of the abdomen reveals - No Visible peristalsis and No Abnormal pulsations. Umbilicus - No Bleeding, No Urine drainage. Palpation/Percussion Palpation and Percussion of the abdomen reveal - Soft, Non Tender, No Rebound tenderness, No Rigidity (guarding) and No Cutaneous hyperesthesia. Note: Thin and atrophic but soft. No diastases. No umbilical hernias.   Female Genitourinary Sexual Maturity Tanner 5 - Adult hair pattern. Note: No vaginal bleeding nor discharge. 5mm defect palpated at apex of vaginal  cuff consistent with fistulous opening. No hard mass or tumor felt.   Rectal Note: Perianal skin clean. A few small anterior anal tags. Mildly weakened but overall normal sphincter tone without defects. Boggy redundant rectosigmoid colon was large volume of soft stool in vault. No prolapse. No mass or tumor felt to 8 cm.   Peripheral Vascular Upper Extremity Inspection - Left - No Cyanotic nailbeds, Not Ischemic. Right - No Cyanotic nailbeds, Not Ischemic.  Neurologic Neurologic evaluation reveals -normal attention span and ability to concentrate, able to name objects and repeat phrases. Appropriate fund of knowledge , normal sensation and normal coordination. Mental Status Affect - not angry, not paranoid. Cranial Nerves-Normal Bilaterally. Gait-Normal.  Neuropsychiatric Mental status exam performed with findings of-able to articulate well with normal speech/language, rate, volume and coherence, thought content normal with ability to perform basic computations and apply abstract reasoning and no evidence of hallucinations, delusions, obsessions or homicidal/suicidal ideation.  Musculoskeletal Global Assessment Spine, Ribs and Pelvis - no instability, subluxation or laxity. Right Upper  Extremity - no instability, subluxation or laxity.  Lymphatic Head & Neck  General Head & Neck Lymphatics: Bilateral - Description - No Localized lymphadenopathy. Axillary  General Axillary Region: Bilateral - Description - No Localized lymphadenopathy. Femoral & Inguinal  Generalized Femoral & Inguinal Lymphatics: Left - Description - No Localized lymphadenopathy. Right - Description - No Localized lymphadenopathy.    Assessment & Plan Ardeth Sportsman MD; 10/01/2014 1:18 PM)  COLOVAGINAL FISTULA (619.1  N82.4) Impression: Obvious colovaginal fistula by physical exam history and CT scan.  Standard of care would be sigmoid colectomy and primary repair of fistula. Obviously given her advanced age her risks of surgery are a little higher. No easy option. She is quite functional and independent. While she needs a walker for balance, she actually can walk quite far & does not have any history of dyspnea on exertion chest pain or any cardiopulmonary issues. The fistula is quite bothersome and affects her quality of life. she seems to have less tolerance for it now. The urinary tract infections are most likely related to this as well as a reflection of inflammation and irritation. That is also irritating for her as well. Hopefully those will go away after surgery since she's never had them before. Possible small colovesical fistula as well. Will evaluate intraop. Low threshold to place an omental patch over the bladder & vagina to avoid recurrence.  I strongly recommended a colonoscopy to make sure that there is no cancer / tumor or other etiology. We will try and coordinate in a way where she can have the bowel prep and colonoscopy with surgery the next day, avoiding repeated preps. Her son is interested that as she sometimes as issues with coughing ?aspiration to some liquids.  Current Plans Schedule for Surgery Written instructions provided Pt Education - Pamphlet Given - Colorectal Surgery:  discussed with patient and provided information. The anatomy & physiology of the digestive tract was discussed. The pathophysiology of colorectal fistula to the vagina was discussed. Natural history risks without surgery was discussed. I worked to give an overview of the disease and the frequent need to have multispecialty involvement.  I feel the risks of no intervention will lead to serious problems that outweigh the operative risks; therefore, I recommended surgery to treat the pathology. Minimally Invasive (robotic/laparoscopic) & open techniques for probable colectomy were discussed. Repair of the vaginal opening with possible need for intervening material to prevent recurrence such as omentum, muscle, mesh, etc  was discussed. Possible fecal diversion by ostomy was discussed. We will work to preserve anal & pelvic floor function without sacrificing cure.  Risks such as bleeding, infection, abscess, leak, recurrence with reoperation, possible ostomy, hernia, heart attack, death, and other risks were discussed. I noted a good likelihood this will help address the problem. Goals of post-operative recovery were discussed as well. We will work to minimize complications. Educational information was given as well. Questions were answered.  The patient expresses understanding & wishes to proceed with surgery. Pt Education - CCS Pelvic Floor Exercises (Kegels) and Dysfunction HCI (Danyella Mcginty) Pt Education - CCS Good Bowel Health (Lovette Merta) Pt Education - CCS Abdominal Surgery (Darolyn Double) Started Neomycin Sulfate 500MG , 2 (two) Tablet SEE NOTE, #6, 10/01/2014, No Refill. Local Order: TAKE TWO TABLETS AT 2 PM, 3 PM, AND 10 PM THE DAY PRIOR TO SURGERY Started Flagyl 500MG , 2 (two) Tablet SEE NOTE, #6, 10/01/2014, No Refill. Local Order: Take at 2pm, 3pm, and 10pm the day prior to your colon operation Pt Education - CCS Colon Bowel Prep 2015 Miralax/Antibiotics  Ardeth Sportsman, M.D., F.A.C.S. Gastrointestinal and  Minimally Invasive Surgery [facility] [address], [address] [place], [place] [address] [phone] Main / Paging

## 2014-11-20 ENCOUNTER — Other Ambulatory Visit (HOSPITAL_COMMUNITY): Payer: Self-pay | Admitting: *Deleted

## 2014-11-24 ENCOUNTER — Encounter (HOSPITAL_COMMUNITY): Payer: Self-pay

## 2014-11-24 ENCOUNTER — Encounter (HOSPITAL_COMMUNITY)
Admission: RE | Admit: 2014-11-24 | Discharge: 2014-11-24 | Disposition: A | Discharge status: Home / Self Care | Payer: Medicare Other | Source: Ambulatory Visit | Attending: Surgery | Admitting: Surgery

## 2014-11-24 ENCOUNTER — Telehealth: Payer: Self-pay | Admitting: General Surgery

## 2014-11-24 DIAGNOSIS — N824 Other female intestinal-genital tract fistulae: Secondary | ICD-10-CM | POA: Diagnosis not present

## 2014-11-24 DIAGNOSIS — Z01818 Encounter for other preprocedural examination: Secondary | ICD-10-CM | POA: Insufficient documentation

## 2014-11-24 HISTORY — DX: Blindness, one eye, unspecified eye: H54.40

## 2014-11-24 HISTORY — DX: Transient cerebral ischemic attack, unspecified: G45.9

## 2014-11-24 HISTORY — DX: Dysphagia, unspecified: R13.10

## 2014-11-24 HISTORY — DX: Chronic kidney disease, unspecified: N18.9

## 2014-11-24 HISTORY — DX: Unspecified osteoarthritis, unspecified site: M19.90

## 2014-11-24 HISTORY — DX: Fracture of unspecified parts of lumbosacral spine and pelvis, initial encounter for closed fracture: S32.9XXA

## 2014-11-24 HISTORY — DX: Other complications of anesthesia, initial encounter: T88.59XA

## 2014-11-24 HISTORY — DX: Type 2 diabetes mellitus without complications: E11.9

## 2014-11-24 HISTORY — DX: Unspecified atrial fibrillation: I48.91

## 2014-11-24 HISTORY — DX: Spinal stenosis, site unspecified: M48.00

## 2014-11-24 HISTORY — DX: Chronic obstructive pulmonary disease, unspecified: J44.9

## 2014-11-24 HISTORY — DX: Gastro-esophageal reflux disease without esophagitis: K21.9

## 2014-11-24 HISTORY — DX: Adverse effect of unspecified anesthetic, initial encounter: T41.45XA

## 2014-11-24 HISTORY — DX: Dependence on supplemental oxygen: Z99.81

## 2014-11-24 HISTORY — DX: Anemia, unspecified: D64.9

## 2014-11-24 HISTORY — DX: Hyperlipidemia, unspecified: E78.5

## 2014-11-24 LAB — BASIC METABOLIC PANEL
Anion gap: 7 (ref 5–15)
BUN: 30 mg/dL — AB (ref 6–20)
CHLORIDE: 102 mmol/L (ref 101–111)
CO2: 32 mmol/L (ref 22–32)
Calcium: 10.4 mg/dL — ABNORMAL HIGH (ref 8.9–10.3)
Creatinine, Ser: 0.9 mg/dL (ref 0.44–1.00)
GFR calc Af Amer: >60 mL/min (ref >60)
GFR calc non Af Amer: 56 mL/min — ABNORMAL LOW (ref >60)
GLUCOSE: 110 mg/dL — AB (ref 65–99)
POTASSIUM: 6.3 mmol/L — AB (ref 3.5–5.1)
Sodium: 141 mmol/L (ref 135–145)

## 2014-11-24 LAB — CBC
HEMATOCRIT: 37.4 % (ref 36.0–46.0)
Hemoglobin: 11.8 g/dL — ABNORMAL LOW (ref 12.0–15.0)
MCH: 30 pg (ref 26.0–34.0)
MCHC: 31.6 g/dL (ref 30.0–36.0)
MCV: 95.2 fL (ref 78.0–100.0)
Platelets: 257 10*3/uL (ref 150–400)
RBC: 3.93 MIL/uL (ref 3.87–5.11)
RDW: 14.1 % (ref 11.5–15.5)
WBC: 7.8 10*3/uL (ref 4.0–10.5)

## 2014-11-24 LAB — ABO/RH: ABO/RH(D): O POS

## 2014-11-24 NOTE — Progress Notes (Signed)
CRITICAL VALUE ALERT  Critical value received potassium  6.3  Date of notification:  11-24-2014  Time of notification:  1244 pm  Critical value read back:yes  Nurse who received alert:  Ozias Dicenzo rn  MD notified (1st page): Reinaldo Meeker RN at [facility] called and made aware   Time of first page:  155 pm  MD notified (2nd page):  Time of second page:  Responding MD:    Time MD responded:

## 2014-11-24 NOTE — Consult Note (Signed)
WOC ostomy consult note Preop stoma site selection visit per Dr. Michaell Cowing' request.  Patient understands that an ostomy is not planned and that in the event of an emergent need for diversion, one may be created.  The patient's abdomen is assessed in the standing and sitting positions. The abdominal rectus muscle is easily palpated and a mark is made in the  RMQ, 5cm to the right of the umbilicus and in line with the same.  There are numerous wrinkles and creases in the entire lower quadrant and the patient beds over slightly when ambulating using her walker making placement above the umbilicus undesirable.  The area selected has the fewest creases and is the best option for a stoma should one be needed intraoperatively. Loraine Leriche is made with a surgical site marking pen and covered with a thin film dressing. PAtient and the gentleman accompanying her decline the provision of ostomy educational materials as at this time all are hopeful that a stoma can be avoided.  Should one be created, educational materials as well as management support and teaching will be provided. WOC nursing team will not follow, but will remain available to this patient, the nursing, surgical and medical teams.  Please re-consult if an ostomy is created intraoperatively on Friday, 11/28/14. Thanks, Ladona Mow, MSN, RN, GNP, [place], CWON-AP [phone])

## 2014-11-24 NOTE — Progress Notes (Signed)
Spoke with dr Acey Lav anesthesia and reviewed medical history ok to use 04-26-14 chest xray epic and 04-26-14 ekg epic for 11-28-14 surgery, pt ok for surgery per dr Acey Lav Echo 07-05-13 dr Learta Codding on chart lov dr Bary Castilla cardiology 09-11-13 on chart Loc dr Su Monks pulmonary 10-20-14 on chart

## 2014-11-24 NOTE — Patient Instructions (Addendum)
Julie Richardson  11/24/2014   Your procedure is scheduled on: November 28, 2015  Report to [facility]  Entrance take [facility]  elevators to 3rd floor to  Short Stay Center at 1000 AM.  Call this number if you have problems the morning of surgery [phone]   Remember: ONLY 1 PERSON MAY GO WITH YOU TO SHORT STAY TO GET  READY MORNING OF YOUR SURGERY.  Do not eat food or drink liquids :After Midnight. FOLLOW UP WITH DR GROSS OFFICE CONCERNING CLEAR LIQUIDS ALL DAY DAY BEFORE SURGERY 11-27-14 PHONE NUMBER [phone]  Please provide copy of daye and time last medications given from [facility]   Take these medicines the morning of surgery with A SIP OF WATER: NO MEDS TO TAKE, RESTASIS EYE DROP AND SYSTANE EYE DROP IF NEEDED DO NOT TAKE ANY DIABETIC MEDICATIONS DAY OF YOUR SURGERY                               You may not have any metal on your body including hair pins and              piercings  Do not wear jewelry, make-up, lotions, powders or perfumes, deodorant             Do not wear nail polish.  Do not shave  48 hours prior to surgery.              Men may shave face and neck.   Do not bring valuables to the hospital. [facility] IS NOT             RESPONSIBLE   FOR VALUABLES.  Contacts, dentures or bridgework may not be worn into surgery.  Leave suitcase in the car. After surgery it may be brought to your room.     Patients discharged the day of surgery will not be allowed to drive home.  Name and phone number of your driver:  Special Instructions: N/A              Please read over the following fact sheets you were given: _____________________________________________________________________             [facility] - Preparing for Surgery Before surgery, you can play an important role.  Because skin is not sterile, your skin needs to be as free of germs as possible.  You can reduce the number of germs on your skin by washing with CHG (chlorahexidine  gluconate) soap before surgery.  CHG is an antiseptic cleaner which kills germs and bonds with the skin to continue killing germs even after washing. Please DO NOT use if you have an allergy to CHG or antibacterial soaps.  If your skin becomes reddened/irritated stop using the CHG and inform your nurse when you arrive at Short Stay. Do not shave (including legs and underarms) for at least 48 hours prior to the first CHG shower.  You may shave your face/neck. Please follow these instructions carefully:  1.  Shower with CHG Soap the night before surgery and the  morning of Surgery.  2.  If you choose to wash your hair, wash your hair first as usual with your  normal  shampoo.  3.  After you shampoo, rinse your hair and body thoroughly to remove the  shampoo.  4.  Use CHG as you would any other liquid soap.  You can apply chg directly  to the skin and wash                       Gently with a scrungie or clean washcloth.  5.  Apply the CHG Soap to your body ONLY FROM THE NECK DOWN.   Do not use on face/ open                           Wound or open sores. Avoid contact with eyes, ears mouth and genitals (private parts).                       Wash face,  Genitals (private parts) with your normal soap.             6.  Wash thoroughly, paying special attention to the area where your surgery  will be performed.  7.  Thoroughly rinse your body with warm water from the neck down.  8.  DO NOT shower/wash with your normal soap after using and rinsing off  the CHG Soap.                9.  Pat yourself dry with a clean towel.            10.  Wear clean pajamas.            11.  Place clean sheets on your bed the night of your first shower and do not  sleep with pets. Day of Surgery : Do not apply any lotions/deodorants the morning of surgery.  Please wear clean clothes to the hospital/surgery center.  FAILURE TO FOLLOW THESE INSTRUCTIONS MAY RESULT IN THE CANCELLATION OF YOUR  SURGERY PATIENT SIGNATURE_________________________________  NURSE SIGNATURE__________________________________  ________________________________________________________________________

## 2014-11-25 ENCOUNTER — Other Ambulatory Visit (HOSPITAL_COMMUNITY): Payer: Self-pay | Admitting: *Deleted

## 2014-11-25 LAB — HEMOGLOBIN A1C
Hgb A1c MFr Bld: 5.7 % — ABNORMAL HIGH (ref 4.8–5.6)
MEAN PLASMA GLUCOSE: 117 mg/dL

## 2014-11-25 NOTE — Progress Notes (Signed)
Spoke with alicia spillers at dr gross office, dr gross aware of potassium 6.3 on 11-24-14 and pre op labs were faxed to [facility] on 11-25-14 for dr Lowell Guitar or nurse practictioner at [facility]  to assess patient prior to surgery. Dr gross wants repeat bmet day of surgery. Helmut Muster spoke with nocole nurse at [facility] and will fax information from [facility] to [facility] when available.

## 2014-11-26 NOTE — Progress Notes (Signed)
Spoke with Julie Richardson at dr gross office, pt received   2 doses kayexelate and potassium recheck done 11-26-14, potassium was 4.0, [facility]ed to [facility] and bmet 11-26-14 [facility] placed on chart. Dr gross wants bmet rechecked am of surgery 11-28-14.

## 2014-11-27 ENCOUNTER — Ambulatory Visit: Payer: Medicare Other | Admitting: Gastroenterology

## 2014-11-27 MED ORDER — GENTAMICIN SULFATE 40 MG/ML IJ SOLN
INTRAMUSCULAR | Status: DC
  Filled 2014-11-27: qty 6

## 2014-11-28 ENCOUNTER — Encounter (HOSPITAL_COMMUNITY): Payer: Self-pay | Admitting: *Deleted

## 2014-11-28 ENCOUNTER — Inpatient Hospital Stay (HOSPITAL_COMMUNITY): Payer: Medicare Other | Admitting: Anesthesiology

## 2014-11-28 ENCOUNTER — Encounter (HOSPITAL_COMMUNITY)
Admission: RE | Disposition: A | Discharge status: Skilled Nursing Facility | Payer: Self-pay | Source: Ambulatory Visit | Attending: Surgery

## 2014-11-28 ENCOUNTER — Inpatient Hospital Stay (HOSPITAL_COMMUNITY)
Admission: RE | Admit: 2014-11-28 | Discharge: 2014-12-10 | DRG: 330 | Disposition: A | Discharge status: Skilled Nursing Facility | Payer: Medicare Other | Source: Ambulatory Visit | Attending: Surgery | Admitting: Surgery

## 2014-11-28 DIAGNOSIS — Z66 Do not resuscitate: Secondary | ICD-10-CM | POA: Diagnosis present

## 2014-11-28 DIAGNOSIS — Z792 Long term (current) use of antibiotics: Secondary | ICD-10-CM

## 2014-11-28 DIAGNOSIS — Z8673 Personal history of transient ischemic attack (TIA), and cerebral infarction without residual deficits: Secondary | ICD-10-CM | POA: Diagnosis not present

## 2014-11-28 DIAGNOSIS — K567 Ileus, unspecified: Secondary | ICD-10-CM | POA: Diagnosis not present

## 2014-11-28 DIAGNOSIS — K117 Disturbances of salivary secretion: Secondary | ICD-10-CM | POA: Diagnosis present

## 2014-11-28 DIAGNOSIS — I480 Paroxysmal atrial fibrillation: Secondary | ICD-10-CM | POA: Diagnosis not present

## 2014-11-28 DIAGNOSIS — K219 Gastro-esophageal reflux disease without esophagitis: Secondary | ICD-10-CM | POA: Diagnosis present

## 2014-11-28 DIAGNOSIS — N183 Chronic kidney disease, stage 3 (moderate): Secondary | ICD-10-CM | POA: Diagnosis present

## 2014-11-28 DIAGNOSIS — N823 Fistula of vagina to large intestine: Secondary | ICD-10-CM | POA: Diagnosis present

## 2014-11-28 DIAGNOSIS — I129 Hypertensive chronic kidney disease with stage 1 through stage 4 chronic kidney disease, or unspecified chronic kidney disease: Secondary | ICD-10-CM | POA: Diagnosis not present

## 2014-11-28 DIAGNOSIS — I481 Persistent atrial fibrillation: Secondary | ICD-10-CM | POA: Diagnosis not present

## 2014-11-28 DIAGNOSIS — Z7952 Long term (current) use of systemic steroids: Secondary | ICD-10-CM

## 2014-11-28 DIAGNOSIS — I4891 Unspecified atrial fibrillation: Secondary | ICD-10-CM | POA: Diagnosis not present

## 2014-11-28 DIAGNOSIS — R1312 Dysphagia, oropharyngeal phase: Secondary | ICD-10-CM | POA: Diagnosis not present

## 2014-11-28 DIAGNOSIS — D62 Acute posthemorrhagic anemia: Secondary | ICD-10-CM | POA: Diagnosis not present

## 2014-11-28 DIAGNOSIS — J479 Bronchiectasis, uncomplicated: Secondary | ICD-10-CM

## 2014-11-28 DIAGNOSIS — R059 Cough, unspecified: Secondary | ICD-10-CM

## 2014-11-28 DIAGNOSIS — Z87440 Personal history of urinary (tract) infections: Secondary | ICD-10-CM

## 2014-11-28 DIAGNOSIS — N824 Other female intestinal-genital tract fistulae: Secondary | ICD-10-CM | POA: Diagnosis present

## 2014-11-28 DIAGNOSIS — E1122 Type 2 diabetes mellitus with diabetic chronic kidney disease: Secondary | ICD-10-CM | POA: Diagnosis present

## 2014-11-28 DIAGNOSIS — E440 Moderate protein-calorie malnutrition: Secondary | ICD-10-CM

## 2014-11-28 DIAGNOSIS — R05 Cough: Secondary | ICD-10-CM

## 2014-11-28 DIAGNOSIS — Z8249 Family history of ischemic heart disease and other diseases of the circulatory system: Secondary | ICD-10-CM

## 2014-11-28 DIAGNOSIS — E7800 Pure hypercholesterolemia, unspecified: Secondary | ICD-10-CM | POA: Diagnosis not present

## 2014-11-28 DIAGNOSIS — Z9981 Dependence on supplemental oxygen: Secondary | ICD-10-CM | POA: Diagnosis not present

## 2014-11-28 DIAGNOSIS — Z681 Body mass index (BMI) 19 or less, adult: Secondary | ICD-10-CM

## 2014-11-28 DIAGNOSIS — H9190 Unspecified hearing loss, unspecified ear: Secondary | ICD-10-CM | POA: Diagnosis not present

## 2014-11-28 DIAGNOSIS — H5442 Blindness, left eye, normal vision right eye: Secondary | ICD-10-CM | POA: Diagnosis present

## 2014-11-28 DIAGNOSIS — J438 Other emphysema: Secondary | ICD-10-CM | POA: Diagnosis not present

## 2014-11-28 DIAGNOSIS — J449 Chronic obstructive pulmonary disease, unspecified: Secondary | ICD-10-CM | POA: Diagnosis not present

## 2014-11-28 DIAGNOSIS — I639 Cerebral infarction, unspecified: Secondary | ICD-10-CM

## 2014-11-28 DIAGNOSIS — E785 Hyperlipidemia, unspecified: Secondary | ICD-10-CM | POA: Diagnosis not present

## 2014-11-28 DIAGNOSIS — R4781 Slurred speech: Secondary | ICD-10-CM | POA: Diagnosis present

## 2014-11-28 DIAGNOSIS — I471 Supraventricular tachycardia: Secondary | ICD-10-CM | POA: Diagnosis not present

## 2014-11-28 DIAGNOSIS — K5730 Diverticulosis of large intestine without perforation or abscess without bleeding: Secondary | ICD-10-CM | POA: Diagnosis not present

## 2014-11-28 DIAGNOSIS — R1310 Dysphagia, unspecified: Secondary | ICD-10-CM | POA: Insufficient documentation

## 2014-11-28 DIAGNOSIS — E876 Hypokalemia: Secondary | ICD-10-CM | POA: Diagnosis not present

## 2014-11-28 DIAGNOSIS — H9193 Unspecified hearing loss, bilateral: Secondary | ICD-10-CM | POA: Diagnosis not present

## 2014-11-28 LAB — GLUCOSE, CAPILLARY: GLUCOSE-CAPILLARY: 67 mg/dL (ref 65–99)

## 2014-11-28 LAB — BASIC METABOLIC PANEL
ANION GAP: 11 (ref 5–15)
BUN: 33 mg/dL — ABNORMAL HIGH (ref 6–20)
CALCIUM: 9.5 mg/dL (ref 8.9–10.3)
CO2: 21 mmol/L — ABNORMAL LOW (ref 22–32)
Chloride: 104 mmol/L (ref 101–111)
Creatinine, Ser: 1.18 mg/dL — ABNORMAL HIGH (ref 0.44–1.00)
GFR calc Af Amer: 47 mL/min — ABNORMAL LOW (ref >60)
GFR, EST NON AFRICAN AMERICAN: 41 mL/min — AB (ref >60)
Glucose, Bld: 69 mg/dL (ref 65–99)
POTASSIUM: 3.7 mmol/L (ref 3.5–5.1)
SODIUM: 136 mmol/L (ref 135–145)

## 2014-11-28 LAB — TYPE AND SCREEN
ABO/RH(D): O POS
Antibody Screen: NEGATIVE

## 2014-11-28 SURGERY — COLECTOMY, PARTIAL, ROBOT-ASSISTED, LAPAROSCOPIC
Anesthesia: General | Site: Abdomen

## 2014-11-28 MED ORDER — PROPOFOL 10 MG/ML IV BOLUS
INTRAVENOUS | Status: DC | PRN
  Administered 2014-11-28: 80 mg via INTRAVENOUS

## 2014-11-28 MED ORDER — PHENYLEPHRINE HCL 10 MG/ML IJ SOLN
INTRAMUSCULAR | Status: AC
  Filled 2014-11-28: qty 1

## 2014-11-28 MED ORDER — SODIUM CHLORIDE 0.9 % IJ SOLN
INTRAMUSCULAR | Status: AC
  Filled 2014-11-28: qty 50

## 2014-11-28 MED ORDER — LACTATED RINGERS IV BOLUS (SEPSIS)
1000.0000 mL | Freq: Three times a day (TID) | INTRAVENOUS | Status: AC | PRN
Start: 2014-11-28 — End: 2014-11-30

## 2014-11-28 MED ORDER — PHENOL 1.4 % MT LIQD: 2.0000 | OROMUCOSAL | Status: DC | PRN

## 2014-11-28 MED ORDER — SOTALOL HCL 80 MG PO TABS
80.0000 mg | ORAL_TABLET | Freq: Two times a day (BID) | ORAL | Status: DC
  Administered 2014-11-28 – 2014-11-29 (×2): 80 mg via ORAL
  Filled 2014-11-28 (×3): qty 1

## 2014-11-28 MED ORDER — FENTANYL CITRATE (PF) 100 MCG/2ML IJ SOLN
INTRAMUSCULAR | Status: DC | PRN
Start: 2014-11-28 — End: 2014-11-28
  Administered 2014-11-28: 50 ug via INTRAVENOUS
  Administered 2014-11-28 (×2): 100 ug via INTRAVENOUS

## 2014-11-28 MED ORDER — ATORVASTATIN CALCIUM 20 MG PO TABS
20.0000 mg | ORAL_TABLET | Freq: Every day | ORAL | Status: DC
  Administered 2014-11-28: 20 mg via ORAL
  Filled 2014-11-28 (×2): qty 1

## 2014-11-28 MED ORDER — PHENYLEPHRINE HCL 10 MG/ML IJ SOLN
INTRAMUSCULAR | Status: DC | PRN
  Administered 2014-11-28 (×3): 80 ug via INTRAVENOUS

## 2014-11-28 MED ORDER — ERYTHROMYCIN 5 MG/GM OP OINT
1.0000 "application " | TOPICAL_OINTMENT | Freq: Every day | OPHTHALMIC | Status: DC
  Administered 2014-11-28 – 2014-12-09 (×12): 1 via OPHTHALMIC
  Filled 2014-11-28: qty 3.5

## 2014-11-28 MED ORDER — HYDROMORPHONE HCL 1 MG/ML IJ SOLN
0.5000 mg | INTRAMUSCULAR | Status: DC | PRN
  Administered 2014-11-30 (×2): 0.5 mg via INTRAVENOUS
  Filled 2014-11-28 (×2): qty 1

## 2014-11-28 MED ORDER — ALVIMOPAN 12 MG PO CAPS
12.0000 mg | ORAL_CAPSULE | Freq: Once | ORAL | Status: DC
  Filled 2014-11-28: qty 1

## 2014-11-28 MED ORDER — ONDANSETRON HCL 4 MG/2ML IJ SOLN
INTRAMUSCULAR | Status: DC | PRN
  Administered 2014-11-28: 4 mg via INTRAVENOUS

## 2014-11-28 MED ORDER — ACETAMINOPHEN 500 MG PO TABS
1000.0000 mg | ORAL_TABLET | Freq: Three times a day (TID) | ORAL | Status: DC
  Administered 2014-11-28 – 2014-11-29 (×2): 1000 mg via ORAL
  Filled 2014-11-28 (×4): qty 2

## 2014-11-28 MED ORDER — SUCCINYLCHOLINE CHLORIDE 20 MG/ML IJ SOLN
INTRAMUSCULAR | Status: DC | PRN
  Administered 2014-11-28: 80 mg via INTRAVENOUS

## 2014-11-28 MED ORDER — BISACODYL 5 MG PO TBEC
5.0000 mg | DELAYED_RELEASE_TABLET | Freq: Two times a day (BID) | ORAL | Status: DC
  Administered 2014-11-28 – 2014-11-29 (×2): 5 mg via ORAL
  Filled 2014-11-28 (×2): qty 1

## 2014-11-28 MED ORDER — DIPHENHYDRAMINE HCL 12.5 MG/5ML PO ELIX: 12.5000 mg | ORAL_SOLUTION | Freq: Four times a day (QID) | ORAL | Status: DC | PRN

## 2014-11-28 MED ORDER — PHENYLEPHRINE 40 MCG/ML (10ML) SYRINGE FOR IV PUSH (FOR BLOOD PRESSURE SUPPORT)
PREFILLED_SYRINGE | INTRAVENOUS | Status: AC
  Filled 2014-11-28: qty 10

## 2014-11-28 MED ORDER — LIDOCAINE HCL (PF) 2 % IJ SOLN
INTRAMUSCULAR | Status: DC | PRN
  Administered 2014-11-28: 30 mg via INTRADERMAL

## 2014-11-28 MED ORDER — RESOURCE THICKENUP CLEAR PO POWD
Freq: Three times a day (TID) | ORAL | Status: DC
Start: 2014-11-29 — End: 2014-12-09
  Administered 2014-11-29: 08:00:00 via ORAL
  Filled 2014-11-28: qty 125

## 2014-11-28 MED ORDER — DEXTROSE 5 % IV SOLN
2.0000 g | INTRAVENOUS | Status: AC
  Administered 2014-11-28: 2 g via INTRAVENOUS
  Filled 2014-11-28: qty 2

## 2014-11-28 MED ORDER — BUPIVACAINE-EPINEPHRINE 0.25% -1:200000 IJ SOLN
INTRAMUSCULAR | Status: DC | PRN
  Administered 2014-11-28: 30 mL

## 2014-11-28 MED ORDER — SODIUM CHLORIDE 0.9 % IV SOLN: 250.0000 mL | INTRAVENOUS | Status: DC | PRN

## 2014-11-28 MED ORDER — POLYETHYL GLYCOL-PROPYL GLYCOL 0.4-0.3 % OP SOLN: 1.0000 [drp] | OPHTHALMIC | Status: DC | PRN

## 2014-11-28 MED ORDER — LIP MEDEX EX OINT
1.0000 "application " | TOPICAL_OINTMENT | Freq: Two times a day (BID) | CUTANEOUS | Status: DC
  Administered 2014-11-30 – 2014-12-10 (×20): 1 via TOPICAL
  Filled 2014-11-28: qty 7

## 2014-11-28 MED ORDER — METOPROLOL TARTRATE 1 MG/ML IV SOLN
5.0000 mg | Freq: Four times a day (QID) | INTRAVENOUS | Status: DC | PRN
  Filled 2014-11-28: qty 5

## 2014-11-28 MED ORDER — ENSURE ENLIVE PO LIQD
237.0000 mL | Freq: Two times a day (BID) | ORAL | Status: DC
  Administered 2014-11-29: 237 mL via ORAL

## 2014-11-28 MED ORDER — BUPIVACAINE LIPOSOME 1.3 % IJ SUSP
20.0000 mL | Freq: Once | INTRAMUSCULAR | Status: AC
  Administered 2014-11-28: 20 mL
  Filled 2014-11-28: qty 20

## 2014-11-28 MED ORDER — METOPROLOL TARTRATE 1 MG/ML IV SOLN
INTRAVENOUS | Status: DC | PRN
  Administered 2014-11-28 (×2): 1 mg via INTRAVENOUS

## 2014-11-28 MED ORDER — IPRATROPIUM-ALBUTEROL 0.5-2.5 (3) MG/3ML IN SOLN
3.0000 mL | Freq: Two times a day (BID) | RESPIRATORY_TRACT | Status: DC
  Administered 2014-11-29 – 2014-12-10 (×22): 3 mL via RESPIRATORY_TRACT
  Filled 2014-11-28 (×23): qty 3

## 2014-11-28 MED ORDER — ONDANSETRON HCL 4 MG PO TABS: 4.0000 mg | ORAL_TABLET | Freq: Four times a day (QID) | ORAL | Status: DC | PRN

## 2014-11-28 MED ORDER — BUPIVACAINE-EPINEPHRINE 0.25% -1:200000 IJ SOLN
INTRAMUSCULAR | Status: AC
  Filled 2014-11-28: qty 1

## 2014-11-28 MED ORDER — ONDANSETRON HCL 4 MG/2ML IJ SOLN
INTRAMUSCULAR | Status: AC
  Filled 2014-11-28: qty 2

## 2014-11-28 MED ORDER — 0.9 % SODIUM CHLORIDE (POUR BTL) OPTIME
TOPICAL | Status: DC | PRN
  Administered 2014-11-28: 2000 mL

## 2014-11-28 MED ORDER — ONDANSETRON HCL 4 MG/2ML IJ SOLN: 4.0000 mg | Freq: Four times a day (QID) | INTRAMUSCULAR | Status: DC | PRN

## 2014-11-28 MED ORDER — PANTOPRAZOLE SODIUM 40 MG PO TBEC
80.0000 mg | DELAYED_RELEASE_TABLET | Freq: Every day | ORAL | Status: DC
  Administered 2014-11-28 – 2014-11-29 (×2): 80 mg via ORAL
  Filled 2014-11-28 (×3): qty 2

## 2014-11-28 MED ORDER — DEXAMETHASONE SODIUM PHOSPHATE 10 MG/ML IJ SOLN
INTRAMUSCULAR | Status: AC
  Filled 2014-11-28: qty 1

## 2014-11-28 MED ORDER — CYCLOSPORINE 0.05 % OP EMUL
1.0000 [drp] | Freq: Two times a day (BID) | OPHTHALMIC | Status: DC
  Administered 2014-11-28 – 2014-12-10 (×24): 1 [drp] via OPHTHALMIC
  Filled 2014-11-28 (×29): qty 1

## 2014-11-28 MED ORDER — PROPOFOL 10 MG/ML IV BOLUS
INTRAVENOUS | Status: AC
  Filled 2014-11-28: qty 20

## 2014-11-28 MED ORDER — CEFOTETAN DISODIUM-DEXTROSE 2-2.08 GM-% IV SOLR
INTRAVENOUS | Status: AC
  Filled 2014-11-28: qty 50

## 2014-11-28 MED ORDER — ROCURONIUM BROMIDE 100 MG/10ML IV SOLN
INTRAVENOUS | Status: AC
  Filled 2014-11-28: qty 1

## 2014-11-28 MED ORDER — SODIUM CHLORIDE 0.9 % IV SOLN
INTRAVENOUS | Status: DC | PRN
  Administered 2014-11-28: 16:00:00 via INTRAPERITONEAL

## 2014-11-28 MED ORDER — CHLORHEXIDINE GLUCONATE 4 % EX LIQD: 60.0000 mL | Freq: Once | CUTANEOUS | Status: DC

## 2014-11-28 MED ORDER — ONDANSETRON HCL 4 MG/2ML IJ SOLN: 4.0000 mg | Freq: Once | INTRAMUSCULAR | Status: DC | PRN

## 2014-11-28 MED ORDER — MENTHOL 3 MG MT LOZG
1.0000 | LOZENGE | OROMUCOSAL | Status: DC | PRN
Start: 2014-11-28 — End: 2014-12-10

## 2014-11-28 MED ORDER — IPRATROPIUM-ALBUTEROL 0.5-2.5 (3) MG/3ML IN SOLN
3.0000 mL | Freq: Three times a day (TID) | RESPIRATORY_TRACT | Status: DC
  Administered 2014-11-28: 3 mL via RESPIRATORY_TRACT
  Filled 2014-11-28: qty 3

## 2014-11-28 MED ORDER — SODIUM CHLORIDE 0.9 % IV SOLN
INTRAVENOUS | Status: DC
Start: 2014-11-28 — End: 2014-12-01
  Administered 2014-11-28 – 2014-11-30 (×4): via INTRAVENOUS

## 2014-11-28 MED ORDER — ALUM & MAG HYDROXIDE-SIMETH 200-200-20 MG/5ML PO SUSP: 30.0000 mL | Freq: Four times a day (QID) | ORAL | Status: DC | PRN

## 2014-11-28 MED ORDER — PROMETHAZINE HCL 25 MG/ML IJ SOLN: 6.2500 mg | INTRAMUSCULAR | Status: DC | PRN

## 2014-11-28 MED ORDER — HYDROCORTISONE 2.5 % RE CREA
1.0000 "application " | TOPICAL_CREAM | RECTAL | Status: DC | PRN
  Filled 2014-11-28: qty 28.35

## 2014-11-28 MED ORDER — POLYETHYLENE GLYCOL 3350 17 G PO PACK
17.0000 g | PACK | Freq: Every day | ORAL | Status: DC
  Administered 2014-11-29: 17 g via ORAL
  Filled 2014-11-28: qty 1

## 2014-11-28 MED ORDER — FENTANYL CITRATE (PF) 100 MCG/2ML IJ SOLN: 25.0000 ug | INTRAMUSCULAR | Status: DC | PRN

## 2014-11-28 MED ORDER — POLYVINYL ALCOHOL 1.4 % OP SOLN
1.0000 [drp] | OPHTHALMIC | Status: DC | PRN
  Filled 2014-11-28: qty 15

## 2014-11-28 MED ORDER — VITAMIN D3 25 MCG (1000 UNIT) PO TABS
2000.0000 [IU] | ORAL_TABLET | Freq: Every day | ORAL | Status: DC
  Administered 2014-11-29: 2000 [IU] via ORAL
  Filled 2014-11-28: qty 2

## 2014-11-28 MED ORDER — FENTANYL CITRATE (PF) 250 MCG/5ML IJ SOLN
INTRAMUSCULAR | Status: AC
  Filled 2014-11-28: qty 25

## 2014-11-28 MED ORDER — PREDNISONE 5 MG PO TABS
5.0000 mg | ORAL_TABLET | Freq: Every day | ORAL | Status: DC
  Administered 2014-11-29: 5 mg via ORAL
  Filled 2014-11-28 (×2): qty 1

## 2014-11-28 MED ORDER — VITAMIN C 500 MG PO TABS
1000.0000 mg | ORAL_TABLET | Freq: Two times a day (BID) | ORAL | Status: DC
  Administered 2014-11-28 – 2014-11-29 (×2): 1000 mg via ORAL
  Filled 2014-11-28 (×3): qty 2

## 2014-11-28 MED ORDER — METOPROLOL TARTRATE 1 MG/ML IV SOLN
INTRAVENOUS | Status: AC
  Filled 2014-11-28: qty 5

## 2014-11-28 MED ORDER — ENOXAPARIN SODIUM 30 MG/0.3ML ~~LOC~~ SOLN
30.0000 mg | SUBCUTANEOUS | Status: DC
  Administered 2014-11-29 – 2014-12-05 (×7): 30 mg via SUBCUTANEOUS
  Filled 2014-11-28 (×8): qty 0.3

## 2014-11-28 MED ORDER — SODIUM CHLORIDE 0.9 % IJ SOLN: 3.0000 mL | INTRAMUSCULAR | Status: DC | PRN

## 2014-11-28 MED ORDER — LIDOCAINE HCL (CARDIAC) 20 MG/ML IV SOLN
INTRAVENOUS | Status: AC
  Filled 2014-11-28: qty 5

## 2014-11-28 MED ORDER — ROCURONIUM BROMIDE 100 MG/10ML IV SOLN
INTRAVENOUS | Status: DC | PRN
  Administered 2014-11-28: 30 mg via INTRAVENOUS
  Administered 2014-11-28: 10 mg via INTRAVENOUS

## 2014-11-28 MED ORDER — SACCHAROMYCES BOULARDII 250 MG PO CAPS
250.0000 mg | ORAL_CAPSULE | Freq: Two times a day (BID) | ORAL | Status: DC
  Administered 2014-11-28 – 2014-12-10 (×14): 250 mg via ORAL
  Filled 2014-11-28 (×20): qty 1

## 2014-11-28 MED ORDER — SUGAMMADEX SODIUM 200 MG/2ML IV SOLN
INTRAVENOUS | Status: DC | PRN
  Administered 2014-11-28: 170 mg via INTRAVENOUS

## 2014-11-28 MED ORDER — SODIUM CHLORIDE 0.9 % IJ SOLN
3.0000 mL | Freq: Two times a day (BID) | INTRAMUSCULAR | Status: DC
  Administered 2014-12-02 – 2014-12-09 (×7): 3 mL via INTRAVENOUS

## 2014-11-28 MED ORDER — LACTATED RINGERS IV SOLN
INTRAVENOUS | Status: DC | PRN
  Administered 2014-11-28: 12:00:00 via INTRAVENOUS

## 2014-11-28 MED ORDER — TRAMADOL HCL 50 MG PO TABS: 50.0000 mg | ORAL_TABLET | Freq: Four times a day (QID) | ORAL | Status: AC | PRN

## 2014-11-28 MED ORDER — DEXAMETHASONE SODIUM PHOSPHATE 10 MG/ML IJ SOLN
INTRAMUSCULAR | Status: DC | PRN
  Administered 2014-11-28: 4 mg via INTRAVENOUS

## 2014-11-28 MED ORDER — SUGAMMADEX SODIUM 200 MG/2ML IV SOLN
INTRAVENOUS | Status: AC
  Filled 2014-11-28: qty 2

## 2014-11-28 MED ORDER — ALVIMOPAN 12 MG PO CAPS
12.0000 mg | ORAL_CAPSULE | Freq: Two times a day (BID) | ORAL | Status: DC
  Administered 2014-11-29: 12 mg via ORAL
  Filled 2014-11-28 (×2): qty 1

## 2014-11-28 MED ORDER — DIPHENHYDRAMINE HCL 50 MG/ML IJ SOLN
12.5000 mg | Freq: Four times a day (QID) | INTRAMUSCULAR | Status: DC | PRN
  Administered 2014-12-08: 12.5 mg via INTRAVENOUS
  Filled 2014-11-28: qty 1

## 2014-11-28 MED ORDER — DEXTROSE 5 % IV SOLN
2.0000 g | Freq: Two times a day (BID) | INTRAVENOUS | Status: AC
  Administered 2014-11-28: 2 g via INTRAVENOUS
  Filled 2014-11-28: qty 2

## 2014-11-28 MED ORDER — ZOLPIDEM TARTRATE 5 MG PO TABS: 5.0000 mg | ORAL_TABLET | Freq: Every evening | ORAL | Status: DC | PRN

## 2014-11-28 MED ORDER — SODIUM CHLORIDE 0.9 % IJ SOLN
INTRAMUSCULAR | Status: DC | PRN
  Administered 2014-11-28: 40 mL

## 2014-11-28 SURGICAL SUPPLY — 107 items
APPLIER CLIP 5 13 M/L LIGAMAX5 (MISCELLANEOUS)
APPLIER CLIP ROT 10 11.4 M/L (STAPLE)
BLADE EXTENDED COATED 6.5IN (ELECTRODE) IMPLANT
BLADE SURG SZ11 CARB STEEL (BLADE) ×2 IMPLANT
CABLE HIGH FREQUENCY MONO STRZ (ELECTRODE) IMPLANT
CANNULA REDUC XI 12-8 STAPL (CANNULA) ×1
CANNULA REDUCER 12-8 DVNC XI (CANNULA) ×1 IMPLANT
CATH KIT ON-Q SILVERSOAK 7.5IN (CATHETERS) IMPLANT
CELLS DAT CNTRL 66122 CELL SVR (MISCELLANEOUS) IMPLANT
CHLORAPREP W/TINT 26ML (MISCELLANEOUS) ×2 IMPLANT
CLIP APPLIE 5 13 M/L LIGAMAX5 (MISCELLANEOUS) IMPLANT
CLIP APPLIE ROT 10 11.4 M/L (STAPLE) IMPLANT
CLIP LIGATING HEM O LOK PURPLE (MISCELLANEOUS) IMPLANT
CLIP LIGATING HEMO O LOK GREEN (MISCELLANEOUS) IMPLANT
COUNTER NEEDLE 20 DBL MAG RED (NEEDLE) ×2 IMPLANT
COVER MAYO STAND STRL (DRAPES) ×4 IMPLANT
COVER SURGICAL LIGHT HANDLE (MISCELLANEOUS) ×2 IMPLANT
COVER TIP SHEARS 8 DVNC (MISCELLANEOUS) ×1 IMPLANT
COVER TIP SHEARS 8MM DA VINCI (MISCELLANEOUS) ×1
DECANTER SPIKE VIAL GLASS SM (MISCELLANEOUS) ×4 IMPLANT
DEVICE TROCAR PUNCTURE CLOSURE (ENDOMECHANICALS) IMPLANT
DRAIN CHANNEL 19F RND (DRAIN) IMPLANT
DRAPE ARM DVNC X/XI (DISPOSABLE) ×4 IMPLANT
DRAPE COLUMN DVNC XI (DISPOSABLE) ×1 IMPLANT
DRAPE DA VINCI XI ARM (DISPOSABLE) ×4
DRAPE DA VINCI XI COLUMN (DISPOSABLE) ×1
DRAPE SURG IRRIG POUCH 19X23 (DRAPES) ×2 IMPLANT
DRAPE WARM FLUID 44X44 (DRAPE) IMPLANT
DRSG OPSITE POSTOP 4X10 (GAUZE/BANDAGES/DRESSINGS) IMPLANT
DRSG OPSITE POSTOP 4X6 (GAUZE/BANDAGES/DRESSINGS) IMPLANT
DRSG OPSITE POSTOP 4X8 (GAUZE/BANDAGES/DRESSINGS) IMPLANT
DRSG TEGADERM 2-3/8X2-3/4 SM (GAUZE/BANDAGES/DRESSINGS) ×2 IMPLANT
DRSG TEGADERM 4X4.75 (GAUZE/BANDAGES/DRESSINGS) IMPLANT
ELECT PENCIL ROCKER SW 15FT (MISCELLANEOUS) ×4 IMPLANT
ELECT REM PT RETURN 9FT ADLT (ELECTROSURGICAL) ×2
ELECTRODE REM PT RTRN 9FT ADLT (ELECTROSURGICAL) ×1 IMPLANT
ENDOLOOP SUT PDS II  0 18 (SUTURE)
ENDOLOOP SUT PDS II 0 18 (SUTURE) IMPLANT
EVACUATOR SILICONE 100CC (DRAIN) IMPLANT
GAUZE SPONGE 2X2 8PLY STRL LF (GAUZE/BANDAGES/DRESSINGS) ×1 IMPLANT
GAUZE SPONGE 4X4 12PLY STRL (GAUZE/BANDAGES/DRESSINGS) IMPLANT
GLOVE ECLIPSE 8.0 STRL XLNG CF (GLOVE) ×6 IMPLANT
GLOVE INDICATOR 8.0 STRL GRN (GLOVE) ×6 IMPLANT
GOWN STRL REUS W/TWL XL LVL3 (GOWN DISPOSABLE) ×8 IMPLANT
KIT PROCEDURE DA VINCI SI (MISCELLANEOUS) ×1
KIT PROCEDURE DVNC SI (MISCELLANEOUS) ×1 IMPLANT
LEGGING LITHOTOMY PAIR STRL (DRAPES) ×2 IMPLANT
LUBRICANT JELLY K Y 4OZ (MISCELLANEOUS) IMPLANT
NEEDLE INSUFFLATION 14GA 120MM (NEEDLE) ×2 IMPLANT
PACK CARDIOVASCULAR III (CUSTOM PROCEDURE TRAY) ×2 IMPLANT
PACK COLON (CUSTOM PROCEDURE TRAY) ×2 IMPLANT
PEN SKIN MARKING BROAD (MISCELLANEOUS) ×2 IMPLANT
PORT LAP GEL ALEXIS MED 5-9CM (MISCELLANEOUS) ×2 IMPLANT
RTRCTR WOUND ALEXIS 18CM MED (MISCELLANEOUS)
SCISSORS LAP 5X35 DISP (ENDOMECHANICALS) IMPLANT
SCRUB PCMX 4 OZ (MISCELLANEOUS) ×2 IMPLANT
SEAL CANN UNIV 5-8 DVNC XI (MISCELLANEOUS) ×3 IMPLANT
SEAL XI 5MM-8MM UNIVERSAL (MISCELLANEOUS) ×3
SEALER VESSEL DA VINCI XI (MISCELLANEOUS)
SEALER VESSEL EXT DVNC XI (MISCELLANEOUS) IMPLANT
SET IRRIG TUBING LAPAROSCOPIC (IRRIGATION / IRRIGATOR) ×2 IMPLANT
SLEEVE XCEL OPT CAN 5 100 (ENDOMECHANICALS) IMPLANT
SOLUTION ELECTROLUBE (MISCELLANEOUS) ×2 IMPLANT
SPONGE GAUZE 2X2 STER 10/PKG (GAUZE/BANDAGES/DRESSINGS) ×1
STAPLER 45 BLU RELOAD XI (STAPLE) ×1 IMPLANT
STAPLER 45 BLUE RELOAD XI (STAPLE) ×1
STAPLER 45 GREEN RELOAD XI (STAPLE)
STAPLER 45 GRN RELOAD XI (STAPLE) IMPLANT
STAPLER CANNULA SEAL DVNC XI (STAPLE) ×1 IMPLANT
STAPLER CANNULA SEAL XI (STAPLE) ×1
STAPLER CIRC CVD 29MM 37CM (STAPLE) ×2 IMPLANT
STAPLER CUT CVD 40MM BLUE (STAPLE) ×2 IMPLANT
STAPLER SHEATH (SHEATH)
STAPLER SHEATH ENDOWRIST DVNC (SHEATH) IMPLANT
SUT MNCRL AB 4-0 PS2 18 (SUTURE) ×2 IMPLANT
SUT PDS AB 1 CTX 36 (SUTURE) ×4 IMPLANT
SUT PDS AB 1 TP1 96 (SUTURE) IMPLANT
SUT PDS AB 2-0 CT2 27 (SUTURE) IMPLANT
SUT PROLENE 0 CT 2 (SUTURE) ×2 IMPLANT
SUT PROLENE 2 0 SH DA (SUTURE) IMPLANT
SUT SILK 2 0 (SUTURE) ×1
SUT SILK 2 0 SH CR/8 (SUTURE) ×2 IMPLANT
SUT SILK 2-0 18XBRD TIE 12 (SUTURE) ×1 IMPLANT
SUT SILK 3 0 (SUTURE) ×1
SUT SILK 3 0 SH CR/8 (SUTURE) ×2 IMPLANT
SUT SILK 3-0 18XBRD TIE 12 (SUTURE) ×1 IMPLANT
SUT V-LOC BARB 180 2/0GR6 GS22 (SUTURE)
SUT VIC AB 2-0 SH 27 (SUTURE) ×1
SUT VIC AB 2-0 SH 27X BRD (SUTURE) ×1 IMPLANT
SUT VIC AB 3-0 SH 18 (SUTURE) IMPLANT
SUT VIC AB 3-0 SH 27 (SUTURE)
SUT VIC AB 3-0 SH 27XBRD (SUTURE) IMPLANT
SUT VICRYL 0 UR6 27IN ABS (SUTURE) ×4 IMPLANT
SUT VLOC 180 2-0 9IN GS21 (SUTURE) IMPLANT
SUTURE V-LC BRB 180 2/0GR6GS22 (SUTURE) IMPLANT
SYRINGE 10CC LL (SYRINGE) ×2 IMPLANT
SYS LAPSCP GELPORT 120MM (MISCELLANEOUS)
SYSTEM LAPSCP GELPORT 120MM (MISCELLANEOUS) IMPLANT
TAPE UMBILICAL COTTON 1/8X30 (MISCELLANEOUS) ×2 IMPLANT
TOWEL OR 17X26 10 PK STRL BLUE (TOWEL DISPOSABLE) ×2 IMPLANT
TOWEL OR NON WOVEN STRL DISP B (DISPOSABLE) IMPLANT
TRAY FOLEY W/METER SILVER 14FR (SET/KITS/TRAYS/PACK) ×2 IMPLANT
TRAY FOLEY W/METER SILVER 16FR (SET/KITS/TRAYS/PACK) ×2 IMPLANT
TROCAR BLADELESS OPT 5 100 (ENDOMECHANICALS) ×2 IMPLANT
TUBING CONNECTING 10 (TUBING) IMPLANT
TUBING FILTER THERMOFLATOR (ELECTROSURGICAL) ×2 IMPLANT
TUNNELER SHEATH ON-Q 16GX12 DP (PAIN MANAGEMENT) IMPLANT

## 2014-11-28 NOTE — Anesthesia Postprocedure Evaluation (Signed)
  Anesthesia Post-op Note  Patient: Julie Richardson  Procedure(s) Performed: Procedure(s) (LRB): XI ROBOT ASSISTED SIGMOID COLECTOMY, REPAIR OF COLOVAGINAL FISTULA,RIGID PROCTOSCOPY (N/A)  Patient Location: PACU  Anesthesia Type: General  Level of Consciousness: awake and alert   Airway and Oxygen Therapy: Patient Spontanous Breathing  Post-op Pain: mild  Post-op Assessment: Post-op Vital signs reviewed, Patient's Cardiovascular Status Stable, Respiratory Function Stable, Patent Airway and No signs of Nausea or vomiting  Last Vitals:  Filed Vitals:   11/28/14 1758  BP: 142/70  Pulse: 74  Temp: 36.1 C  Resp: 12    Post-op Vital Signs: stable   Complications: No apparent anesthesia complications

## 2014-11-28 NOTE — Progress Notes (Addendum)
Informed Dr. Gentry Roch patient is unable to tolerate PO medications. Usually crushes and takes with applesauce. Betapace given last night at 2000. AM dose held per nurse at [facility]. Dr. Gentry Roch states they will give in OR. Entereg not given. Dr. Michaell Cowing notified patient is unable to take Entereg due to intolerance of PO meds. Potassium result called to OR.

## 2014-11-28 NOTE — Anesthesia Preprocedure Evaluation (Addendum)
Anesthesia Evaluation  Patient identified by MRN, date of birth, ID band Patient awake    Reviewed: Allergy & Precautions, NPO status , Patient's Chart, lab work & pertinent test results  History of Anesthesia Complications (+) history of anesthetic complications ("slow to emerge")  Airway Mallampati: III  TM Distance: >3 FB Neck ROM: Full  Mouth opening: Limited Mouth Opening  Dental no notable dental hx. (+) Dental Advisory Given   Pulmonary COPD (oxygen at home at night 2L),    Pulmonary exam normal breath sounds clear to auscultation       Cardiovascular + Peripheral Vascular Disease  Normal cardiovascular exam+ dysrhythmias Atrial Fibrillation  Rhythm:Regular Rate:Normal     Neuro/Psych TIAnegative psych ROS   GI/Hepatic Neg liver ROS, GERD  Medicated and Controlled,  Endo/Other  diabetes  Renal/GU negative Renal ROS  negative genitourinary   Musculoskeletal  (+) Arthritis , Osteoarthritis,    Abdominal   Peds negative pediatric ROS (+)  Hematology negative hematology ROS (+)   Anesthesia Other Findings   Reproductive/Obstetrics negative OB ROS                            Anesthesia Physical Anesthesia Plan  ASA: III  Anesthesia Plan: General   Post-op Pain Management:    Induction: Intravenous  Airway Management Planned: Oral ETT and Video Laryngoscope Planned  Additional Equipment:   Intra-op Plan:   Post-operative Plan:   Informed Consent: I have reviewed the patients History and Physical, chart, labs and discussed the procedure including the risks, benefits and alternatives for the proposed anesthesia with the patient or authorized representative who has indicated his/her understanding and acceptance.   Dental advisory given  Plan Discussed with:   Anesthesia Plan Comments:        Anesthesia Quick Evaluation

## 2014-11-28 NOTE — Transfer of Care (Signed)
Immediate Anesthesia Transfer of Care Note  Patient: Julie Richardson  Procedure(s) Performed: Procedure(s): XI ROBOT ASSISTED SIGMOID COLECTOMY, REPAIR OF COLOVAGINAL FISTULA,RIGID PROCTOSCOPY (N/A)  Patient Location: PACU  Anesthesia Type:General  Level of Consciousness:  sedated, patient cooperative and responds to stimulation  Airway & Oxygen Therapy:Patient Spontanous Breathing and Patient connected to face mask oxgen  Post-op Assessment:  Report given to PACU RN and Post -op Vital signs reviewed and stable  Post vital signs:  Reviewed and stable  Last Vitals:  Filed Vitals:   11/28/14 1008  BP: 114/67  Pulse: 88  Temp: 36.6 C  Resp: 15    Complications: No apparent anesthesia complications

## 2014-11-28 NOTE — Anesthesia Procedure Notes (Signed)
Procedure Name: Intubation Date/Time: 11/28/2014 12:39 PM Performed by: Early Osmond E Pre-anesthesia Checklist: Patient identified, Emergency Drugs available, Suction available and Patient being monitored Patient Re-evaluated:Patient Re-evaluated prior to inductionOxygen Delivery Method: Circle System Utilized Preoxygenation: Pre-oxygenation with 100% oxygen Intubation Type: IV induction Ventilation: Mask ventilation without difficulty Laryngoscope Size: Miller, 2 and Glidescope (First look with Miller, anterior a/w, changed to Glidescope with great view) Grade View: Grade I Tube type: Oral Tube size: 6.5 mm Number of attempts: 2 (First look DL with miller with no view, grade 1 view with Glidescope, slight difficulty passing ETT thru cords, Dr. Gentry Roch passed ETT thru cords w/o difficulty or trauma) Airway Equipment and Method: Stylet and Video-laryngoscopy Placement Confirmation: ETT inserted through vocal cords under direct vision,  positive ETCO2 and breath sounds checked- equal and bilateral Secured at: 19 cm Tube secured with: Tape Dental Injury: Teeth and Oropharynx as per pre-operative assessment  Difficulty Due To: Difficulty was anticipated, Difficult Airway- due to anterior larynx and Difficult Airway- due to limited oral opening

## 2014-11-28 NOTE — H&P (Signed)
Julie Richardson 10/01/2014 10:40 AM Location: [facility] Patient #: [id number] DOB: Sep 23, 1925 Married / Language: Undefined / Race: Undefined Female  History of Present Illness Ardeth Sportsman MD; 10/01/2014 1:16 PM) Patient words: vag. fistula.  The patient is a 81 year old female who presents with colovaginal fistula. Patient sent for a surgical consultation requested by Dr. Lorin Picket McDiarmid with [facility] for concern of colovaginal fistula. Pleasant elderly woman. Uses a walker for balance but otherwise active. History of urinary tract infections. At least 3 times this year. First time ever. Patient developed feculent drainage from her vagina earlier this year. Workup concerning for a colovaginal fistula. Discussed with urology. He recommended considering surgical repair the patient interested. Because it is annoying and frustrating for her, she wishes to be aggressive at least consider surgery. She comes today with her son. They're usually from [facility] area. Patient normally has a bowel movement a few times a day. No severe bouts of constipation and diarrhea. However since her fistula development output has been moderate. Changing dressing a few times a day. No history of heart attacks or strokes. She is nonicteric blood thinners. She cannot see out of her left eye but otherwise is rather functional and active. History of fall trauma. No history of Crohn's or colitis or inflammatory bowel disease. No strong family history of rectal cancer that she knows of. She's never had a colonoscopy.   Other Problems Fay Records, CMA; 10/01/2014 10:41 AM) Atrial Fibrillation Back Pain Cerebrovascular Accident Diabetes Mellitus Gastric Ulcer Home Oxygen Use Hypercholesterolemia Oophorectomy Bilateral. Transfusion history  Past Surgical History Fay Records, CMA; 10/01/2014 10:41 AM) Cataract Surgery Right. Hysterectomy (not due to cancer) -  Complete Shoulder Surgery Right. Tonsillectomy  Diagnostic Studies History Fay Records, [place]; 10/01/2014 10:41 AM) Colonoscopy never Mammogram >3 years ago Pap Smear 1-5 years ago  Allergies Fay Records, CMA; 10/01/2014 10:42 AM) Coumarin *CHEMICALS* Sulfa 10 *OPHTHALMIC AGENTS*  Medication History Fay Records, CMA; 10/01/2014 10:43 AM) Sotalol HCl (  Tablet, Oral) Active. PredniSONE (  Tablet, Oral) Active. Omeprazole (  Capsule DR, Oral) Active. Mapap (  Tablet, Oral) Active. Ipratropium-Albuterol (0.5-2.5 (3)MG/3ML Solution, Inhalation) Active. Erythromycin ( /GM Ointment, Ophthalmic) Active. Cephalexin (  Capsule, Oral) Active. Atorvastatin Calcium (  Tablet, Oral) Active. Azithromycin (  Tablet, Oral) Active. Pantoprazole Sodium (  Tablet DR, Oral) Active. Medications Reconciled  Social History Fay Records, [place]; 10/01/2014 10:41 AM) Alcohol use Remotely quit alcohol use. Caffeine use Carbonated beverages, Tea. No drug use Tobacco use Never smoker.  Family History Fay Records, [place]; 10/01/2014 10:41 AM) Cancer Brother. Heart Disease Brother, Father, Mother, Son. Heart disease in female family member before age 74 Respiratory Condition Brother. Thyroid problems Mother.  Pregnancy / Birth History Fay Records, CMA; 10/01/2014 10:41 AM) Age at menarche 14 years. Age of menopause <47 Contraceptive History Oral contraceptives. Gravida 1 Maternal age 84-20 Para 1  Review of Systems Fay Records CMA; 10/01/2014 10:41 AM) General Not Present- Appetite Loss, Chills, Fatigue, Fever, Night Sweats, Weight Gain and Weight Loss. Skin Present- Dryness, Non-Healing Wounds and Ulcer. Not Present- Change in Wart/Mole, Hives, Jaundice, New Lesions and Rash. HEENT Present- Hearing Loss, Hoarseness and Wears glasses/contact lenses. Not Present- Earache, Nose Bleed, Oral Ulcers, Ringing in the Ears, Seasonal Allergies, Sinus  Pain, Sore Throat, Visual Disturbances and Yellow Eyes. Respiratory Present- Chronic Cough. Not Present- Bloody sputum, Difficulty Breathing, Snoring and Wheezing. Breast Not Present- Breast Mass, Breast Pain, Nipple Discharge and Skin Changes. Cardiovascular Not Present- Chest Pain, Difficulty Breathing Lying Down, Leg Cramps,  Palpitations, Rapid Heart Rate, Shortness of Breath and Swelling of Extremities. Gastrointestinal Present- Constipation and Difficulty Swallowing. Not Present- Abdominal Pain, Bloating, Bloody Stool, Change in Bowel Habits, Chronic diarrhea, Excessive gas, Gets full quickly at meals, Hemorrhoids, Indigestion, Nausea, Rectal Pain and Vomiting. Female Genitourinary Not Present- Frequency, Nocturia, Painful Urination, Pelvic Pain and Urgency. Musculoskeletal Present- Back Pain. Not Present- Joint Pain, Joint Stiffness, Muscle Pain, Muscle Weakness and Swelling of Extremities. Neurological Not Present- Decreased Memory, Fainting, Headaches, Numbness, Seizures, Tingling, Tremor, Trouble walking and Weakness. Psychiatric Not Present- Anxiety, Bipolar, Change in Sleep Pattern, Depression, Fearful and Frequent crying. Endocrine Not Present- Cold Intolerance, Excessive Hunger, Hair Changes, Heat Intolerance, Hot flashes and New Diabetes. Hematology Present- Easy Bruising, Excessive bleeding and Persistent Infections. Not Present- Gland problems and HIV.   Vitals Fay Records CMA; 10/01/2014 10:43 AM) 10/01/2014 10:43 AM Weight: 92.8 lb Height: 59in Body Surface Area: 1.32 m Body Mass Index: 18.74 kg/m Temp.: 98.62F(Oral)  Pulse: 68 (Regular)  Resp.: 16 (Unlabored)  BP: 124/60 (Sitting, Left Arm, Standard)    Physical Exam Ardeth Sportsman MD; 10/01/2014 11:30 AM) General Mental Status-Alert. General Appearance-Not in acute distress, Not Sickly. Orientation-Oriented X3. Hydration-Well hydrated. Voice-Normal.  Integumentary Global Assessment Upon  inspection and palpation of skin surfaces of the - Axillae: non-tender, no inflammation or ulceration, no drainage. and Distribution of scalp and body hair is normal. General Characteristics Temperature - normal warmth is noted.  Head and Neck Head-normocephalic, atraumatic with no lesions or palpable masses. Face Global Assessment - atraumatic, no absence of expression. Neck Global Assessment - no abnormal movements, no bruit auscultated on the right, no bruit auscultated on the left, no decreased range of motion, non-tender. Trachea-midline. Thyroid Gland Characteristics - non-tender.  Eye Eyeball - Left-Extraocular movements intact, No Nystagmus. Eyeball - Right-Extraocular movements intact, No Nystagmus. Cornea - Left-No Hazy. Cornea - Right-No Hazy. Sclera/Conjunctiva - Left-No scleral icterus, No Discharge. Sclera/Conjunctiva - Right-No scleral icterus, No Discharge. Pupil - Left-Direct reaction to light normal. Pupil - Right-Direct reaction to light normal. Note: Cannot see out of her left eye.   ENMT Ears Pinna - Left - no drainage observed, no generalized tenderness observed. Right - no drainage observed, no generalized tenderness observed. Nose and Sinuses External Inspection of the Nose - no destructive lesion observed. Inspection of the nares - Left - quiet respiration. Right - quiet respiration. Mouth and Throat Lips - Upper Lip - no fissures observed, no pallor noted. Lower Lip - no fissures observed, no pallor noted. Nasopharynx - no discharge present. Oral Cavity/Oropharynx - Tongue - no dryness observed. Oral Mucosa - no cyanosis observed. Hypopharynx - no evidence of airway distress observed.  Chest and Lung Exam Inspection Movements - Normal and Symmetrical. Accessory muscles - No use of accessory muscles in breathing. Palpation Palpation of the chest reveals - Non-tender. Auscultation Breath sounds - Normal and  Clear.  Cardiovascular Auscultation Rhythm - Regular. Murmurs & Other Heart Sounds - Auscultation of the heart reveals - No Murmurs and No Systolic Clicks.  Abdomen Inspection Inspection of the abdomen reveals - No Visible peristalsis and No Abnormal pulsations. Umbilicus - No Bleeding, No Urine drainage. Palpation/Percussion Palpation and Percussion of the abdomen reveal - Soft, Non Tender, No Rebound tenderness, No Rigidity (guarding) and No Cutaneous hyperesthesia. Note: Thin and atrophic but soft. No diastases. No umbilical hernias.   Female Genitourinary Sexual Maturity Tanner 5 - Adult hair pattern. Note: No vaginal bleeding nor discharge. 5mm defect palpated at apex of vaginal cuff  consistent with fistulous opening. No hard mass or tumor felt.   Rectal Note: Perianal skin clean. A few small anterior anal tags. Mildly weakened but overall normal sphincter tone without defects. Boggy redundant rectosigmoid colon was large volume of soft stool in vault. No prolapse. No mass or tumor felt to 8 cm.   Peripheral Vascular Upper Extremity Inspection - Left - No Cyanotic nailbeds, Not Ischemic. Right - No Cyanotic nailbeds, Not Ischemic.  Neurologic Neurologic evaluation reveals -normal attention span and ability to concentrate, able to name objects and repeat phrases. Appropriate fund of knowledge , normal sensation and normal coordination. Mental Status Affect - not angry, not paranoid. Cranial Nerves-Normal Bilaterally. Gait-Normal.  Neuropsychiatric Mental status exam performed with findings of-able to articulate well with normal speech/language, rate, volume and coherence, thought content normal with ability to perform basic computations and apply abstract reasoning and no evidence of hallucinations, delusions, obsessions or homicidal/suicidal ideation.  Musculoskeletal Global Assessment Spine, Ribs and Pelvis - no instability, subluxation or laxity. Right Upper  Extremity - no instability, subluxation or laxity.  Lymphatic Head & Neck  General Head & Neck Lymphatics: Bilateral - Description - No Localized lymphadenopathy. Axillary  General Axillary Region: Bilateral - Description - No Localized lymphadenopathy. Femoral & Inguinal  Generalized Femoral & Inguinal Lymphatics: Left - Description - No Localized lymphadenopathy. Right - Description - No Localized lymphadenopathy.    Assessment & Plan  COLOVAGINAL FISTULA (619.1  N82.4) Impression: Obvious colovaginal fistula by physical exam history and CT scan.  Standard of care would be sigmoid colectomy and primary repair of fistula. Obviously given her advanced age her risks of surgery are a little higher. No easy option. She is quite functional and independent. While she needs a walker for balance, she actually can walk quite far & does not have any history of dyspnea on exertion chest pain or any cardiopulmonary issues. The fistula is quite bothersome and affects her quality of life. she seems to have less tolerance for it now. The urinary tract infections are most likely related to this as well as a reflection of inflammation and irritation. That is also irritating for her as well. Hopefully those will go away after surgery since she's never had them before. Possible small colovesical fistula as well. Will evaluate intraop. Low threshold to place an omental patch over the bladder & vagina to avoid recurrence.  I strongly recommended a colonoscopy to make sure that there is no cancer / tumor or other etiology. We will try and coordinate in a way where she can have the bowel prep and colonoscopy with surgery the next day, avoiding repeated preps. Her son is interested that as she sometimes as issues with coughing ?aspiration to some liquids. Current Plans  Schedule for Surgery Written instructions provided Pt Education - Pamphlet Given - Colorectal Surgery: discussed with patient and provided  information. The anatomy & physiology of the digestive tract was discussed. The pathophysiology of colorectal fistula to the vagina was discussed. Natural history risks without surgery was discussed. I worked to give an overview of the disease and the frequent need to have multispecialty involvement.  I feel the risks of no intervention will lead to serious problems that outweigh the operative risks; therefore, I recommended surgery to treat the pathology. Minimally Invasive (robotic/laparoscopic) & open techniques for probable colectomy were discussed. Repair of the vaginal opening with possible need for intervening material to prevent recurrence such as omentum, muscle, mesh, etc was discussed. Possible fecal diversion by ostomy was  discussed. We will work to preserve anal & pelvic floor function without sacrificing cure.  Risks such as bleeding, infection, abscess, leak, recurrence with reoperation, possible ostomy, hernia, heart attack, death, and other risks were discussed. I noted a good likelihood this will help address the problem. Goals of post-operative recovery were discussed as well. We will work to minimize complications. Educational information was given as well. Questions were answered.  The patient expresses understanding & wishes to proceed with surgery. Pt Education - [facility] Pelvic Floor Exercises (Kegels) and Dysfunction HCI (Roxann Vierra) Pt Education - [facility] Good Bowel Health (Quavion Boule) Pt Education - [facility] Abdominal Surgery (Abygail Galeno) Started Neomycin Sulfate 500MG , 2 (two) Tablet SEE NOTE, #6, 10/01/2014, No Refill. Local Order: TAKE TWO TABLETS AT 2 PM, 3 PM, AND 10 PM THE DAY PRIOR TO SURGERY Started Flagyl 500MG , 2 (two) Tablet SEE NOTE, #6, 10/01/2014, No Refill. Local Order: Take at 2pm, 3pm, and 10pm the day prior to your colon operation Pt Education - [facility] Colon Bowel Prep 2015 Miralax/Antibiotics  Ardeth Sportsman, M.D., F.A.C.S. [facility]astrointestinal [facility]ally [facility] [address], [address] [place], [place] [address] [phone] Main / Paging

## 2014-11-28 NOTE — Interval H&P Note (Signed)
History and Physical Interval Note:  11/28/2014 11:52 AM  Julie Richardson  has presented today for surgery, with the diagnosis of Colovaginal Fistula  The various methods of treatment have been discussed with the patient and family. K WNL now.  D/w Dr Gentry Roch w anesthesia.  After consideration of risks, benefits and other options for treatment, the patient has consented to  Procedure(s): XI ROBOT ASSISTED SIGMOID COLECTOMY, REPAIR OF COLOVAGINAL FISTULA,RIGID PROCTOSCOPY (N/A) as a surgical intervention .  The patient's history has been reviewed, patient examined, no change in status, stable for surgery.  I have reviewed the patient's chart and labs.  Questions were answered to the patient's satisfaction.     [name] C.

## 2014-11-28 NOTE — Op Note (Signed)
11/28/2014  3:21 PM  PATIENT:  Julie Richardson  81 y.o. female  Patient Care Team: Darlina Guys, MD as PCP - General (Internal Medicine) Alfredo Martinez, MD as Consulting Physician (Urology) Karie Soda, MD as Consulting Physician (General Surgery) Dorena Cookey, MD as Consulting Physician (Gastroenterology)  PRE-OPERATIVE DIAGNOSIS:  Colovaginal Fistula  POST-OPERATIVE DIAGNOSIS:  Colovaginal Fistula most likely due to diverticulitis  PROCEDURE:  Procedure(s): XI ROBOT ASSISTED SIGMOID COLECTOMY REPAIR OF COLOVAGINAL FISTULA RIGID PROCTOSCOPY EXAMINATION UNDER ANESTHESIA  SURGEON:  Surgeon(s): Karie Soda, MD Romie Levee, MD - Assist  ANESTHESIA:   local and general  EBL:  Total I/O In: -  Out: 100 [Urine:100]  Delay start of Pharmacological VTE agent (>24hrs) due to surgical blood loss or risk of bleeding:  no  DRAINS: none   SPECIMEN:    Sigmoid colon.  Open end is proximal. Anastomotic rings.  Blue stitch is in proximal ring.  DISPOSITION OF SPECIMEN:  PATHOLOGY  COUNTS:  YES  PLAN OF CARE: Admit to inpatient   PATIENT DISPOSITION:  PACU - hemodynamically stable.  INDICATION:    Pleasant elderly woman that has had feculent drainage per vagina and felt to have a colovaginal fistula.  Colonoscopy shows no obvious cancer.  Therefore diverticulitis most likely etiology.  I recommended segmental resection:  The anatomy & physiology of the digestive tract was discussed.  The pathophysiology was discussed.  Natural history risks without surgery was discussed.   I worked to give an overview of the disease and the frequent need to have multispecialty involvement.  I feel the risks of no intervention will lead to serious problems that outweigh the operative risks; therefore, I recommended a partial colectomy to remove the pathology.  Laparoscopic & open techniques were discussed.   Risks such as bleeding, infection, abscess, leak, reoperation, possible ostomy, hernia,  heart attack, death, and other risks were discussed.  I noted a good likelihood this will help address the problem.   Goals of post-operative recovery were discussed as well.  We will work to minimize complications.  Educational materials on the pathology had been given in the office.  Questions were answered.    The patient and her son expressed understanding & wished to proceed with surgery.  OR FINDINGS:   Patient had obvious inflammation of the proximal sigmoid colon densely adherent to the vaginal cuff.  Redundant rectosigmoid colon.  There was a 1 cm fistula there.  This correlated with a 1 cm defect at the apex of the vagina on perineal examination  No obvious metastatic disease on visceral parietal peritoneum or liver.  The anastomosis rests 10 cm from the anal verge by rigid proctoscopy.  DESCRIPTION:   Informed consent was confirmed.  The patient underwent general anaesthesia without difficulty.  The patient was positioned appropriately.  VTE prevention in place.  The patient's abdomen was clipped, prepped, & draped in a sterile fashion.  Surgical timeout confirmed our plan.  Did examination of the rectum and peritoneum.  I could feel a 1 cm defect at the apex of the vaginal cuff but I cannot do my finger to pass across it.  Is smooth and fibrotic.  No hard nodular mass associated with it.  On digital exam could not feel any adherent up to 12 cm.   The patient was positioned in reverse Trendelenburg.  Abdominal entry was gained using Varess technique with a trach hook on the anterior abdominal wall fascia in the left upper abdomen.  Entry was clean.  I induced  carbon dioxide insufflation.  Camera inspection revealed no injury.  Extra ports were carefully placed under direct laparoscopic visualization.  I reflected the greater omentum and the upper abdomen the small bowel in the upper abdomen.  The patient was carefully positioned.  The Intuitive daVinci robot was carefully docked with  camera & instruments carefully placed.  Did laboratory lie seizures to free small bowel and colon off the lower abdomen especially the right lower quadrant.  The patient had thickened segment of rectosigmoid colon from the pelvic reflection to the bladder and vaginal cuff.  Did robotic lyse adhesions to mobilize this region a lateral to medial fashion.  The ascending colon and followed more distally on the sigmoid colon.  Reflect the left colon off the retroperitoneum.  Identify the gonadal and left ureter and kept those in position.  Patient had dense inflammation to the anterior vaginal cuff.  I mobilized distally down towards the peritoneal reflection on the left and right sides.  Eventually freed off until the obvious area was here to the vaginal cuff.  Did sharp scissor dissection to free the rectosigmoid colon off the vaginal cuff to reveal a 1 cm opening in the vaginal cuff apex consistent with a colovaginal fistula.  I scored the base of peritoneum of the medial side of the mesentery of the left colon from the ligament of Treitz to the peritoneal reflection of the mid rectum.   I elevated the sigmoid mesentery and entered into the retro-mesenteric plane. We were able to identify the left ureter and gonadal vessels.  We kept those posterior within the retroperitoneum and elevated the left colon mesentery off that.   I did isolate the inferior mesenteric artery (IMA) pedicle but did not ligate it yet.  I continued distally and got into the avascular plane posterior to the mesorectum. This allowed me to help mobilize the rectum as well by freeing the mesorectum off the sacrum.  I mobilized the peritoneal coverings towards the peritoneal reflection on both the right and left sides of the rectum.  I stayed away from the right and left ureters.  I kept the lateral vascular pedicles to the distal rectum intact.  Saw the wishbone nervi ergentes and kept them posterior, transecting adhesions off the mesorectum.   The rectum was quite torturous upon itself especially with a hairpin turn due to adhesions in the left pelvis.  Eventually confirming model structures all the way I transected part of the mesorectum on the left side out straighten out the rectum better.  I skeletonized the lymph nodes off the inferior mesenteric artery pedicle.  I went down to its takeoff from the aorta.  I isolated the inferior mesenteric vein off of the ligament of Treitz just cephalad to that as well.  After confirming the left ureter was out of the way, I went ahead and ligated the inferior mesenteric artery pedicle just near its takeoff from the aorta.  I did isolate the inferior mesenteric vein, but I did not ligate it.  I did not want the colon to be too redundant and floppy in this elderly woman to avoid any prolapse. We ensured hemostasis.  I mobilized the left colon in a lateral to medial fashion off the line of Toldt up towards the splenic flexure to ensure good mobilization of the remaining left colon to reach into the pelvis.  I skeletonized the mesorectum at the junction at the mid rectum distal to the prior kinking and inflammation and fistula. This was distal point  of resection.  I transected at the mid mesorectum and transected at the proximal rectum using a robotic 45 mm stapler.  I chose a region at the descending/sigmoid junction that was soft and easily reached down to the rectal stump.  I transected the mesentery of the colon radially to preserve remaining proximal left colon blood supply.  I opened up the right lower quadrant 12 mm robotic port site to 3.5 cm in a Pfannenstiel fashion.  Placed a wound protector.  I was able to eviscerate the rectosigmoid and descending colon out the wound.   I clamped the colon proximal to this area using a soft bowel clamp. I transected at the descending/sigmoid junction with a scalpel. I got healthy bleeding mucosa.  We sent the rectosigmoid colon specimen off to go to pathology.  We  sized the colon orifice.  I chose a 29 EEA anvil stapler system. I placed the anvil to the open end of the proximal remaining colon and closed around it using a 0 Prolene pursestring.  We did copious irrigation with crystalloid solution.  Hemostasis was good.  The distal end of the remaining colon easily reached down to the rectal stump, therefore, splenic flexure mobilization was not needed.      Dr Maisie Fus scrubbed down and did gentle anal dilation and advanced the EEA stapler up the rectal stump. The spike was brought out at the provimal end of the rectal stump under direct visualization.  I attached the anvil of the proximal colon the spike of the stapler. Anvil was tightened down and held clamped for 60 seconds. The EEA stapler was fired and held clamped for 30 seconds. The stapler was released & removed. We noted 2 excellent anastomotic rings. Blue stitch is in the proximal ring.  Dr Maisie Fus did rigid proctoscopy noted the anastomosis was at 10 cm from the anal verge consistent with the proximal rectum.  We did a final irrigation of antibiotic solution (900 mg clindamycin/240 mg gentamicin in a liter of crystalloid) & held that for the pelvic air leak test .  The rectum was insufflated the rectum while clamping the colon proximal to that anastomosis.  There was a negative air leak test. There was no tension of mesentery or bowel at the anastomosis.   Tissues looked viable.  Ureters & bowel uninjured.  The anastomosis looked healthy.  Endoluminal gas was evacuated.  Ports & wound protector removed.  We changed gloves.  We aspirated the antibiotic irrigation.  Hemostasis was good.  Sterile unused instruments were used from this point out per colon SSI prevention protocol.  I closed the 5mm port sites using Monocryl stitch and sterile dressing.  I closed the Pfannenstiel wound using a 0 Vicryl vertical peritoneal closure and a #1 PDS transverse anterior rectal fascial closure. I closed the skin with some  interrupted Monocryl stitches. I placed antibiotic-soaked wicks into the closure at the corners x2. I placed a sterile dressing.    Patient is being extubated go to recovery room. I had discussed postop care with the patient in detail the office & in the holding area. Instructions are written.  I attempted to discuss intraoperative findings and postoperative recommendations with the patient's son, Aurther Loft.  He was not physically here.  I tried his cell phone at [phone].  Left a voice mail.  We will try again later.   Ardeth Sportsman, M.D., F.A.C.S. Gastrointestinal and Minimally Invasive Surgery [facility] [address] [place], [place] [address] [phone] Main / Paging

## 2014-11-29 LAB — MAGNESIUM: Magnesium: 1.5 mg/dL — ABNORMAL LOW (ref 1.7–2.4)

## 2014-11-29 LAB — BASIC METABOLIC PANEL
Anion gap: 8 (ref 5–15)
BUN: 34 mg/dL — AB (ref 6–20)
CALCIUM: 8.3 mg/dL — AB (ref 8.9–10.3)
CO2: 23 mmol/L (ref 22–32)
CREATININE: 1.17 mg/dL — AB (ref 0.44–1.00)
Chloride: 107 mmol/L (ref 101–111)
GFR calc Af Amer: 47 mL/min — ABNORMAL LOW (ref >60)
GFR, EST NON AFRICAN AMERICAN: 41 mL/min — AB (ref >60)
Glucose, Bld: 100 mg/dL — ABNORMAL HIGH (ref 65–99)
Potassium: 4 mmol/L (ref 3.5–5.1)
SODIUM: 138 mmol/L (ref 135–145)

## 2014-11-29 LAB — CBC
HCT: 27.3 % — ABNORMAL LOW (ref 36.0–46.0)
Hemoglobin: 8.8 g/dL — ABNORMAL LOW (ref 12.0–15.0)
MCH: 29.8 pg (ref 26.0–34.0)
MCHC: 32.2 g/dL (ref 30.0–36.0)
MCV: 92.5 fL (ref 78.0–100.0)
PLATELETS: 229 10*3/uL (ref 150–400)
RBC: 2.95 MIL/uL — ABNORMAL LOW (ref 3.87–5.11)
RDW: 14 % (ref 11.5–15.5)
WBC: 9.4 10*3/uL (ref 4.0–10.5)

## 2014-11-29 MED ORDER — HYDROCORTISONE NA SUCCINATE PF 100 MG IJ SOLR
20.0000 mg | Freq: Every day | INTRAMUSCULAR | Status: DC
  Administered 2014-11-30 – 2014-12-04 (×5): 20 mg via INTRAVENOUS
  Filled 2014-11-29 (×2): qty 0.4
  Filled 2014-11-29 (×4): qty 2

## 2014-11-29 MED ORDER — METOPROLOL TARTRATE 1 MG/ML IV SOLN
5.0000 mg | Freq: Four times a day (QID) | INTRAVENOUS | Status: DC
Start: 2014-11-29 — End: 2014-12-01
  Administered 2014-11-29 – 2014-12-01 (×6): 5 mg via INTRAVENOUS
  Filled 2014-11-29 (×12): qty 5

## 2014-11-29 MED ORDER — PANTOPRAZOLE SODIUM 40 MG IV SOLR
40.0000 mg | Freq: Every day | INTRAVENOUS | Status: DC
  Administered 2014-11-29 – 2014-12-04 (×6): 40 mg via INTRAVENOUS
  Filled 2014-11-29 (×8): qty 40

## 2014-11-29 NOTE — Progress Notes (Signed)
Patient ID: Julie Richardson, female   DOB: Nov 02, 1925, 81 y.o.   MRN: [id number] 1 Day Post-Op  Subjective: Up in chair. Denies any pain or nausea or other complaints. Has had some difficulty swallowing pills even when crushed  Objective: Vital signs in last 24 hours: Temp:  [94.8 F (34.9 C)-98.2 F (36.8 C)] 97.7 F (36.5 C) (10/08 0715) Pulse Rate:  [68-91] 77 (10/08 0715) Resp:  [12-22] 14 (10/08 0715) BP: (94-157)/(50-93) 123/62 mmHg (10/08 0715) SpO2:  [98 %-100 %] 100 % (10/08 0715) Weight:  [43.455 kg (95 lb 12.8 oz)] 43.455 kg (95 lb 12.8 oz) (10/07 1032) Last BM Date: 11/26/14  Intake/Output from previous day: 10/07 0701 - 10/08 0700 In: 1928.8 [P.O.:60; I.V.:1818.8; IV Piggyback:50] Out: 825 [Urine:825] Intake/Output this shift:    General appearance: alert, cooperative and no distress Resp: no wheezing or increased work of breathing GI: ssoft, nondistended, with very mild left lower quadrant tenderness and no guarding Incision/Wound:Dressings clean and dry  Lab Results:   Recent Labs  11/29/14 0505  WBC 9.4  HGB 8.8*  HCT 27.3*  PLT 229   BMET  Recent Labs  11/28/14 1030 11/29/14 0505  NA 136 138  K 3.7 4.0  CL 104 107  CO2 21* 23  GLUCOSE 69 100*  BUN 33* 34*  CREATININE 1.18* 1.17*  CALCIUM 9.5 8.3*     Studies/Results: No results found.  Anti-infectives: Anti-infectives    Start     Dose/Rate Route Frequency Ordered Stop   11/28/14 2300  cefoTEtan (CEFOTAN) 2 g in dextrose 5 % 50 mL IVPB     2 g 100 mL/hr over 30 Minutes Intravenous Every 12 hours 11/28/14 1736 11/28/14 2330   11/28/14 1532  clindamycin (CLEOCIN) 900 mg, gentamicin (GARAMYCIN) 240 mg in sodium chloride 0.9 % 1,000 mL for intraperitoneal lavage  Status:  Discontinued       As needed 11/28/14 1532 11/28/14 1535   11/28/14 1006  cefoTEtan (CEFOTAN) 2 g in dextrose 5 % 50 mL IVPB     2 g 100 mL/hr over 30 Minutes Intravenous On call to O.R. 11/28/14 1006 11/28/14 1305   11/28/14 0600  clindamycin (CLEOCIN) 900 mg, gentamicin (GARAMYCIN) 240 mg in sodium chloride 0.9 % 1,000 mL for intraperitoneal lavage  Status:  Discontinued    Comments:  Pharmacy may adjust dosing strength, schedule, rate of infusion, etc as needed to optimize therapy    Intraperitoneal To Surgery 11/27/14 1323 11/28/14 1643      Assessment/Plan: s/p Procedure(s): XI ROBOT ASSISTED SIGMOID COLECTOMY, REPAIR OF COLOVAGINAL FISTULA,RIGID PROCTOSCOPY Doing remarkably well. Activity and deep breathing encouraged. To start full liquids today. OT/PT and speech pathology consults in place  LOS: 1 day    Julie Richardson T 11/29/2014

## 2014-11-29 NOTE — Evaluation (Signed)
Clinical/Bedside Swallow Evaluation Patient Details  Name: Julie Richardson MRN: [id number] Date of Birth: Jan 10, 1926  Today's Date: 11/29/2014 Time: SLP Start Time (ACUTE ONLY): 1240 SLP Stop Time (ACUTE ONLY): 1315 SLP Time Calculation (min) (ACUTE ONLY): 35 min  Past Medical History:  Past Medical History  Diagnosis Date  . HOH (hard of hearing)   . Anemia   . Diabetes mellitus without complication (HCC)   . History of home oxygen therapy     uses 2 liters at hs and prn during day  . Atrial fibrillation (HCC)     hx of  . Spinal stenosis   . GERD (gastroesophageal reflux disease)   . Chronic kidney disease     stage 3  . Arthritis   . Dysphagia     uses thickened liquids  . Hyperlipidemia   . Pelvis fracture (HCC) 6 years ago  . Complication of anesthesia     slow to wake up  . Blind left eye   . HOH (hard of hearing)   . TIA (transient ischemic attack)     7 YEARS AGO, NONE SINCE  . Pneumonia oct 2015    ASPIRATION PNEUMONIA X 3- 4 TIMES IN PAST  . COPD (chronic obstructive pulmonary disease) (HCC)     pseodonomonus bronchiectasis   Past Surgical History:  Past Surgical History  Procedure Laterality Date  . Abdominal hysterectomy    . Right shoulder surgery  years ago    for fracture  . Tonsillectomy  as child   HPI:  Pt is s/p XI robot assisted SIGMOID COLECTOMY, REPAIR OF COLOVAGINAL FISTULA,RIGID PROCTOSCOPY.   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 (overnight in 08/2013).  Pt reports using nectar thick liquids over the last year or so.  Swallow eval ordered as pt reports difficulty swallowing medicine - even when crushed.     Assessment / Plan / Recommendation Clinical Impression  Pt found with emesis basin full of pink colored Ensure and trash can full of tissues pt has used for expectoration.  She herself reports concerns for getting medications swallowed adequately.  Pt presents  with symptoms of severe pharyngeal (? esophageal component) dysphagia characterized by baseline gurgly voice due to secretion aspiration.  Multiple swallows noted with all intake followed by throat clearing/cough and expectoration.   Pt admits to baseline dysphagia but admits this is currently much worse than before admission.  Aspiration occuring even with secretions at this time.  Do not recommend MBS as will only diagnose severe deficits at this time.  Recommend NPO except ice chips and reevaluation at bedside for readiness for MBS.   Skilled intervention included educating pt to findings, reviewing prior MBS and clinical reasoning for potential plan.  Pt agreeable.      Aspiration Risk  Severe    Diet Recommendation Ice chips PRN after oral care        Other  Recommendations     Follow Up Recommendations       Frequency and Duration min 1 x/week  1 week   Pertinent Vitals/Pain Afebrile, decreased      Swallow Study Prior Functional Status   see HHX     General Date of Onset: 11/29/14 Other Pertinent Information: Pt is s/p XI robot assisted SIGMOID COLECTOMY, REPAIR OF COLOVAGINAL FISTULA,RIGID PROCTOSCOPY.   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 (  overnight in 08/2013).  Pt reports using nectar thick liquids over the last year or so.  Swallow eval ordered as pt reports difficulty swallowing medicine - even when crushed.   Type of Study: Bedside swallow evaluation Previous Swallow Assessment: MBS 08/2013 : + silent aspiration of thin when consuming mixed consistency (tablet with thin) - cough not effective,  mild delayed in swallow reflex to pyriform sinus with thin via straw, vallecular space with others, rec dys3/thin, son reported shredded meat to hard for pt to masticate  Diet Prior to this Study: IV;Nectar-thick liquids (nectar thick) Temperature Spikes Noted: No Respiratory Status: Room  air History of Recent Intubation: No Behavior/Cognition: Alert;Cooperative;Pleasant mood Oral Cavity - Dentition: Adequate natural dentition/normal for age Self-Feeding Abilities: Able to feed self Patient Positioning: Upright in chair/Tumbleform Baseline Vocal Quality: Low vocal intensity;Wet (pt reports phlegm in throat is causing her difficulties) Volitional Cough: Strong Volitional Swallow: Able to elicit    Oral/Motor/Sensory Function Overall Oral Motor/Sensory Function: Appears within functional limits for tasks assessed   Ice Chips Ice chips: Impaired Presentation: Self Fed;Spoon Pharyngeal Phase Impairments: Decreased hyoid-laryngeal movement;Throat Clearing - Immediate (multiple swallows)   Thin Liquid Thin Liquid: Not tested    Nectar Thick Nectar Thick Liquid: Impaired Presentation: Self Fed;Cup;Spoon Pharyngeal Phase Impairments: Decreased hyoid-laryngeal movement;Wet Vocal Quality;Multiple swallows;Throat Clearing - Immediate;Cough - Delayed;Cough - Immediate Other Comments: water that was slightly thinner than nectar thick resulted in immediate coughing and multiple swallows, pt with expectoration of portion of all boluses attempting to consume    Honey Thick Honey Thick Liquid: Not tested   Puree Puree: Not tested   Solid   GO    Solid: Not tested       Donavan Burnet, MS [facility] SLP [phone]

## 2014-11-29 NOTE — Evaluation (Signed)
Physical Therapy Evaluation Patient Details Name: Julie Richardson MRN: [id number] DOB: Dec 26, 1925 Today's Date: 11/29/2014   History of Present Illness  XI ROBOT ASSISTED SIGMOID COLECTOMY, REPAIR OF COLOVAGINAL FISTULA,RIGID PROCTOSCOPY on 11/28/14  Clinical Impression  Patient tolerated ambulating x 250' with RW. Patient will benefit from PT top address problems listed in note below. Patient should be able to return to ALF at DC. Recommend HHPT  Follow Up Recommendations Home health PT;Supervision - Intermittent (at ALF)    Equipment Recommendations  None recommended by PT    Recommendations for Other Services       Precautions / Restrictions Precautions Precautions: Fall      Mobility  Bed Mobility               General bed mobility comments: in recliner  Transfers Overall transfer level: Needs assistance Equipment used: Rolling walker (2 wheeled) Transfers: Sit to/from Stand Sit to Stand: Min guard            Ambulation/Gait Ambulation/Gait assistance: Min assist Ambulation Distance (Feet): 250 Feet Assistive device: Rolling walker (2 wheeled)       General Gait Details: close guarding.  Stairs            Wheelchair Mobility    Modified Rankin (Stroke Patients Only)       Balance                                             Pertinent Vitals/Pain Pain Assessment: No/denies pain    Home Living Family/patient expects to be discharged to:: Assisted living               Home Equipment: Walker - 4 wheels      Prior Function Level of Independence: Independent with assistive device(s);Needs assistance   Gait / Transfers Assistance Needed: ambulates with RW to meals  ADL's / Homemaking Assistance Needed: with bath        Hand Dominance        Extremity/Trunk Assessment   Upper Extremity Assessment: Overall WFL for tasks assessed           Lower Extremity Assessment: Generalized weakness       Cervical / Trunk Assessment: Kyphotic  Communication      Cognition Arousal/Alertness: Awake/alert Behavior During Therapy: WFL for tasks assessed/performed Overall Cognitive Status: Within Functional Limits for tasks assessed                      General Comments      Exercises        Assessment/Plan    PT Assessment Patient needs continued PT services  PT Diagnosis Difficulty walking;Generalized weakness   PT Problem List Decreased strength;Decreased activity tolerance;Decreased mobility;Decreased safety awareness  PT Treatment Interventions DME instruction;Gait training;Functional mobility training;Therapeutic activities;Therapeutic exercise;Patient/family education   PT Goals (Current goals can be found in the Care Plan section) Acute Rehab PT Goals Patient Stated Goal: to return to [facility] PT Goal Formulation: With patient Time For Goal Achievement: 12/13/14 Potential to Achieve Goals: Good    Frequency Min 3X/week   Barriers to discharge        Co-evaluation               End of Session   Activity Tolerance: Patient tolerated treatment well Patient left: in chair;with call bell/phone within reach Nurse Communication: Mobility status  Time: 1100-1118 PT Time Calculation (min) (ACUTE ONLY): 18 min   Charges:   PT Evaluation $Initial PT Evaluation Tier I: 1 Procedure     PT G CodesRada Hay 11/29/2014, 11:32 AM

## 2014-11-29 NOTE — Progress Notes (Signed)
RT placed pt on 2LNC due to PT O2 sats 89% on room air

## 2014-11-29 NOTE — Progress Notes (Signed)
Pharmacy Brief Note - Alvimopan (Entereg)   Patient did not receive pre-op entereg dose.  Per [employer] policy: failure to administer pre-op dose disqualifies the patient from receiving any post-op doses.  Plan: -Post-op Entereg order d/ced.  Dorna Leitz, PharmD, BCPS 11/29/2014 10:26 AM

## 2014-11-30 ENCOUNTER — Inpatient Hospital Stay (HOSPITAL_COMMUNITY): Payer: Medicare Other

## 2014-11-30 DIAGNOSIS — R4781 Slurred speech: Secondary | ICD-10-CM | POA: Insufficient documentation

## 2014-11-30 MED ORDER — ASPIRIN 300 MG RE SUPP
300.0000 mg | Freq: Once | RECTAL | Status: AC
  Administered 2014-11-30: 300 mg via RECTAL
  Filled 2014-11-30 (×2): qty 1

## 2014-11-30 MED ORDER — DILTIAZEM HCL 25 MG/5ML IV SOLN
5.0000 mg | Freq: Once | INTRAVENOUS | Status: DC | PRN
  Administered 2014-11-30: 5 mg via INTRAVENOUS
  Filled 2014-11-30 (×2): qty 5

## 2014-11-30 MED ORDER — SODIUM CHLORIDE 0.9 % IV BOLUS (SEPSIS)
500.0000 mL | Freq: Once | INTRAVENOUS | Status: AC
  Administered 2014-11-30: 500 mL via INTRAVENOUS

## 2014-11-30 MED ORDER — HYDROMORPHONE HCL 1 MG/ML IJ SOLN: 0.5000 mg | INTRAMUSCULAR | Status: DC | PRN

## 2014-11-30 NOTE — Progress Notes (Signed)
Speech Language Pathology Treatment: Dysphagia  Patient Details Name: Julie Richardson MRN: [id number] DOB: 04-25-1925 Today's Date: 11/30/2014 Time: 1455-1530 SLP Time Calculation (min) (ACUTE ONLY): 35 min  Assessment / Plan / Recommendation Clinical Impression  Initially today pt without chronic throat clearing but dysarthric. Upon review of chart, noted concern for possible CVA due to pt's dysarthria.  Pt demonstrated improved vocal quality/strength and speech clarity after oral care provided and ice chip consumed.  Suspect xerostomia contributed to pt's dysarthria symptoms.  SLP set up suction to facilitate pt's oral care.     Note MRI and CT head negative for acute events, MRI showed chronic right basal ganglia CVA.  Also note results of CXR Persistent areas of bronchiectasis and peribronchial thickening, with lower lobe predominance.  Bibasilar atelectasis. Possible small bilateral pleural effusions.  Unfortunately, pt continues to demonstrate symptoms of significant pharyngo-? Cervical esophageal dysphagia with all intake.  Consistent throat clearing and expectoration initiated after pt began consuming ice chips.   SLP encouraged pt to attempt Ensure via cup - single bolus swallowed followed by explosive coughing and expectoration.   Expectorates after Ensure consumption were VISCOUS white tinged secretions - not pink colored as expected with Ensure consumption.  ? If this was due to pt severely aspirating Ensure and/or severe secretion retention in pharynx. Both pt and son Julie Richardson admit that pt's dysphagia is much worse than baseline.  Much better tolerance of single ice chip boluses noted.   Pt does not appear ready for po at this time due to ongoing severe dysphagia symptoms.  Hopeful for swallow ability to return to baseline dysphagic level - ? If source of exacerbation is due to short term intubation with surgery and deconditioning.  Of note, pt's mentation is excellent as she recalled ALL of  her testing she completed today without cues including EKG, CXR, CT and MRI!  Mentation bodes well for pt's ability to follow swallowing compensation strategies for maximal airway protection.   Educated pt and son to importance for pt to continue to consume small SINGLE ice chips to aid secretion management, oral hygiene and swallowing musculature.  Using teach back and written instructions, pt/son verbalized clinical reasoning for ice chip consumption and aspiration precautions.  RN informed after session completed.   Hopeful for pt to have MBS next date if secretion management improved and surgery approves.  MD please order MBS if you agree.  SLP to follow up.  Thanks for allowing our team to help with this pt's care plan.     HPI Other Pertinent Information: Pt is s/p XI robot assisted SIGMOID COLECTOMY, REPAIR OF COLOVAGINAL FISTULA,RIGID PROCTOSCOPY.   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 (overnight in 08/2013).  Pt reports using nectar thick liquids over the last year or so.  Swallow eval ordered as pt reports difficulty swallowing medicine - even when crushed.     Pertinent Vitals Pain Assessment: No/denies pain  SLP Plan  MBS (hopefully next date with surgery approval and improved secretion management)    Recommendations Diet recommendations: NPO (ice chips) Liquids provided via: Teaspoon Medication Administration: Via alternative means Supervision: Patient able to self feed Compensations:  (single ice chips, multiple swallows and expectorate/oral suction as needed) Postural Changes and/or Swallow Maneuvers: Seated upright 90 degrees;Upright 30-60 min after meal              Follow up Recommendations: Other (comment) (TBD, ? HH ? )  Plan: MBS (hopefully next date with surgery approval and improved secretion management)    GO     Donavan Burnet, MS [facility] SLP [phone]

## 2014-11-30 NOTE — Evaluation (Signed)
Occupational Therapy Evaluation and Discharge Patient Details Name: Normagene Harvie MRN: [id number] DOB: 07/17/1925 Today's Date: 11/30/2014    History of Present Illness XI ROBOT ASSISTED SIGMOID COLECTOMY, REPAIR OF COLOVAGINAL FISTULA,RIGID PROCTOSCOPY on 11/28/14. 11/30/14: Pt with new onset of dysarthric speech since yesterday. Pt for MRI today.   Clinical Impression   This 81 yo female admitted with above presents to acute OT at a setup/S level only due to having IV, otherwise would have her at a Mod I level using her RW. Acute OT will sign off.    Follow Up Recommendations  No OT follow up    Equipment Recommendations  None recommended by OT       Precautions / Restrictions Precautions Precautions: Fall Restrictions Weight Bearing Restrictions: No      Mobility Bed Mobility               General bed mobility comments: Pt up in recliner upon arrival  Transfers Overall transfer level: Needs assistance Equipment used: Rolling walker (2 wheeled) Transfers: Sit to/from Stand Sit to Stand: Supervision         General transfer comment: Ambulated 150 feet with RW without issues         ADL Overall ADL's : Needs assistance/impaired                                       General ADL Comments: Pt currently at at setup/S level only due to her having an IV and and she cannot manage her the RW and IV at the same time safely.     Vision Additional Comments: No change from baseline          Pertinent Vitals/Pain Pain Assessment: No/denies pain     Hand Dominance Right   Extremity/Trunk Assessment Upper Extremity Assessment Upper Extremity Assessment: Overall WFL for tasks assessed   Lower Extremity Assessment Lower Extremity Assessment: Defer to PT evaluation       Communication Communication Communication:  (dysarthric speech)   Cognition Arousal/Alertness: Awake/alert Behavior During Therapy: WFL for tasks  assessed/performed Overall Cognitive Status: Within Functional Limits for tasks assessed                                Home Living Family/patient expects to be discharged to:: Assisted living Living Arrangements:  ([facility])                           Home Equipment: Environmental consultant - 4 wheels          Prior Functioning/Environment Level of Independence: Independent with assistive device(s)  Gait / Transfers Assistance Needed: ambulates with RW to meals ADL's / Homemaking Assistance Needed: staff help her with a whirlpool bath 2 days week, they manage her meds, and provide her meals.        OT Diagnosis: Generalized weakness         OT Goals(Current goals can be found in the care plan section) Acute Rehab OT Goals Patient Stated Goal: to return to [facility]  OT Frequency:                End of Session Equipment Utilized During Treatment: Rolling walker  Activity Tolerance: Patient tolerated treatment well Patient left: in chair   Time: 3474-2595 OT Time Calculation (min): 20 min Charges:  OT General Charges $OT Visit: 1 Procedure OT Evaluation $Initial OT Evaluation Tier I: 1 Procedure  Evette Georges [phone] 11/30/2014, 11:48 AM

## 2014-11-30 NOTE — Progress Notes (Signed)
Notified by central telemetry that patient heart rate was in the 160's. Patient denied chest pain or discomfort EKG done. BP 94/52 heart rate 98. Notified Lance Coon, PA-C new orders received.

## 2014-11-30 NOTE — Consult Note (Addendum)
[facility] Medical Consultation  Domique Reardon ZOX:[id number] DOB: May 23, 1925 DOA: 11/28/2014 PCP: Darlina Guys, MD   Requesting physician: Dr.Hoxworth Date of consultation: 10/9 Reason for consultation: change in speech  Impression/Recommendations Active Problems:   1. Slurring/Horseness of speech -improving -suspect related to pharyngeal irritation by ET tube, pain meds, xersotomia etc -but will check MRI to r/o CVA, given advanced age and h/o Afib -this was noticed by son when he called her around 8am, extact time of onset unclear, last heard normal by him last night -rectal ASA now, move to Tele till CVA ruled out -if MRI positive will consult Neurology  2. Colovaginal fistula -per CCS  3. H/o chronic Dysphagia/Recurrent aspiration -dysphagia worsened in setting of being post op, pain meds etc, monitor and repeat Speech assessment in 1-2days -also MRI to r/o CVA -check CXR due to cough and R basilar crackles  4. H/o P.Afib -no longer on anticoagulation, was taken off pradaxa by PCP >1year ago, no major bleeding except bruising  5. H/o Bronchiectasis and prior pseudomonal infection per son -on long term azithromycin, on Home O2  6. 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 Please contact me if I can be of assistance in the meanwhile. Thank you for this consultation.  Chief Complaint: here for surgery  HPI: 88/F with colo-vaginal fistula was admitted and underwent sigmoid colectomy on 10/7, she also has PMH of CVA 7years ago,chronic dysphagia arteritis on chronic prednisone, Remote P.AFib - no longer on anticoagulation, h/o bronchiectasis was doing well yesterday, this morning when her son called her around 8am he noticed that her speech sounded different and he came to the hospital over an hour ago and noticed that  her speech had improved but not back to baseline yet. When he left last night her speech sounded different. Nothing significant was noted overnight per staff. After seen by Dr.Hoxworth this morning a TRH consult was requested. Patient also received IV narcotic around 9am and last pm, she also has a tickle in her throat and is trying to clear it and has mild phlegm  Review of Systems:  12 system review negative execpt per HPI  Past Medical History  Diagnosis Date  . HOH (hard of hearing)   . Anemia   . Diabetes mellitus without complication (HCC)   . History of home oxygen therapy     uses 2 liters at hs and prn during day  . Atrial fibrillation (HCC)     hx of  . Spinal stenosis   . GERD (gastroesophageal reflux disease)   . Chronic kidney disease     stage 3  . Arthritis   . Dysphagia     uses thickened liquids  . Hyperlipidemia   . Pelvis fracture (HCC) 6 years ago  . Complication of anesthesia     slow to wake up  . Blind left eye   . HOH (hard of hearing)   . TIA (transient ischemic attack)     7 YEARS AGO, NONE SINCE  . Pneumonia oct 2015    ASPIRATION PNEUMONIA X 3- 4 TIMES IN PAST  . COPD (chronic obstructive pulmonary disease) (HCC)     pseodonomonus bronchiectasis   Past Surgical History  Procedure Laterality Date  . Abdominal hysterectomy    . Right shoulder surgery  years ago  for fracture  . Tonsillectomy  as child   Social History:  reports that she has never smoked. She has never used smokeless tobacco. She reports that she does not drink alcohol or use illicit drugs.  Allergies  Allergen Reactions  . Coumadin [Warfarin Sodium] Other (See Comments)    Rectal bleeding   . Sulfa Antibiotics Rash   History reviewed. No pertinent family history.  Prior to Admission medications   Medication Sig Start Date End Date Taking? Authorizing Provider  acetaminophen (TYLENOL) 325 MG tablet Take 650 mg by mouth 2 (two) times daily.    Yes Historical Provider,  MD  Ascorbic Acid (VITAMIN C) 1000 MG tablet Take 1,000 mg by mouth 2 (two) times daily.   Yes Historical Provider, MD  atorvastatin (LIPITOR) 20 MG tablet Take 20 mg by mouth at bedtime.    Yes Historical Provider, MD  azithromycin (ZITHROMAX) 250 MG tablet Take 250 mg by mouth daily. For chronic obstructive pulmonary disease   Yes Historical Provider, MD  bisacodyl (DULCOLAX) 5 MG EC tablet Take 5 mg by mouth 2 (two) times daily.   Yes Historical Provider, MD  cholecalciferol (VITAMIN D) 1000 UNITS tablet Take 2,000 Units by mouth daily.   Yes Historical Provider, MD  cycloSPORINE (RESTASIS) 0.05 % ophthalmic emulsion Place 1 drop into both eyes 2 (two) times daily.   Yes Historical Provider, MD  erythromycin ophthalmic ointment Place 1 application into both eyes at bedtime.   Yes Historical Provider, MD  ipratropium-albuterol (DUONEB) 0.5-2.5 (3) MG/3ML SOLN Take 3 mLs by nebulization 3 (three) times daily.   Yes Historical Provider, MD  omeprazole (PRILOSEC) 20 MG capsule Take 20 mg by mouth daily.   Yes Historical Provider, MD  Polyethyl Glycol-Propyl Glycol (LUBRICANT EYE DROPS) 0.4-0.3 % SOLN Apply 1 drop to eye as needed (for dry eyes).   Yes Historical Provider, MD  polyethylene glycol (MIRALAX / GLYCOLAX) packet Take 17 g by mouth daily.   Yes Historical Provider, MD  predniSONE (DELTASONE) 5 MG tablet Take 5 mg by mouth daily with breakfast.   Yes Historical Provider, MD  sotalol (BETAPACE) 80 MG tablet Take 80 mg by mouth 2 (two) times daily.   Yes Historical Provider, MD  hydrocortisone (ANUSOL-HC) 2.5 % rectal cream Place 1 application rectally as needed for hemorrhoids or itching.    Historical Provider, MD  hydroxypropyl methylcellulose / hypromellose (ISOPTO TEARS / GONIOVISC) 2.5 % ophthalmic solution Place 1 drop into both eyes as needed for dry eyes.    Historical Provider, MD  traMADol (ULTRAM) 50 MG tablet Take 1-2 tablets (50-100 mg total) by mouth every 6 (six) hours as needed  for moderate pain or severe pain. 11/28/14   Karie Soda, MD   Physical Exam: Blood pressure 104/53, pulse 81, temperature 98.8 F (37.1 C), temperature source Oral, resp. rate 14, height 4\' 11"  (1.499 m), weight 42.865 kg (94 lb 8 oz), SpO2 91 %. Filed Vitals:   11/30/14 1046  BP: 104/53  Pulse: 81  Temp: 98.8 F (37.1 C)  Resp:      General:  AAOx3, no distress  HEENT; PERRLA, EOMI  Cardiovascular: S1S2/RRR  Respiratory: crackles at R base  Abdomen: soft, mildly distended, mild tenderness as expected  Skin: no rashes  Musculoskeletal: no edema c/c  Psychiatric: normal affect  Neurologic: mild hoarseness of voice, speech fluent, cranial nerves intact, motor 5/5, sensory light touch intact  DTR 1 plus, plantars withdrawal  Labs on Admission:  Basic Metabolic Panel:  Recent Labs Lab 11/24/14 1200 11/28/14 1030 11/29/14 0505  NA 141 136 138  K 6.3* 3.7 4.0  CL 102 104 107  CO2 32 21* 23  GLUCOSE 110* 69 100*  BUN 30* 33* 34*  CREATININE 0.90 1.18* 1.17*  CALCIUM 10.4* 9.5 8.3*  MG  --   --  1.5*   Liver Function Tests: No results for input(s): AST, ALT, ALKPHOS, BILITOT, PROT, ALBUMIN in the last 168 hours. No results for input(s): LIPASE, AMYLASE in the last 168 hours. No results for input(s): AMMONIA in the last 168 hours. CBC:  Recent Labs Lab 11/24/14 1200 11/29/14 0505  WBC 7.8 9.4  HGB 11.8* 8.8*  HCT 37.4 27.3*  MCV 95.2 92.5  PLT 257 229   Cardiac Enzymes: No results for input(s): CKTOTAL, CKMB, CKMBINDEX, TROPONINI in the last 168 hours. BNP: Invalid input(s): POCBNP CBG:  Recent Labs Lab 11/28/14 1012  GLUCAP 67    Radiological Exams on Admission: No results found.  EKG: pending  Time spent:  [facility] Pager [phone]  If 7PM-7AM, please contact night-coverage [email or web address] Password [email or web address] 11/30/2014, 11:09 AM

## 2014-11-30 NOTE — Progress Notes (Signed)
Patient ID: Julie Richardson, female   DOB: 1925-04-27, 81 y.o.   MRN: [id number] 2 Days Post-Op  Subjective: Patient actually has no complaints today specifically in terms of her abdomen. However she failed her bedside swallowing evaluation yesterday with concerns of aspirating any by mouth intake. Her son is in the room this morning. Her speech was normal yesterday but he noted when he spoke to her on the phone early this morning that her speech was slurred. Speech is still slurred currently although he says it is better than it was earlier this morning. She denies any other complaints, specifically numbness or weakness.  Objective: Vital signs in last 24 hours: Temp:  [97.4 F (36.3 C)-97.7 F (36.5 C)] 97.7 F (36.5 C) (10/09 0200) Pulse Rate:  [76-80] 78 (10/09 0200) Resp:  [12-14] 14 (10/09 0200) BP: (118-149)/(72-117) 148/72 mmHg (10/09 0200) SpO2:  [90 %-100 %] 90 % (10/09 0846) Weight:  [42.865 kg (94 lb 8 oz)] 42.865 kg (94 lb 8 oz) (10/09 0400) Last BM Date: 11/26/14  Intake/Output from previous day: 10/08 0701 - 10/09 0700 In: 2189.6 [I.V.:2189.6] Out: 1900 [Urine:1900] Intake/Output this shift:    General appearance: alert, cooperative and no distress Resp: no wheezing or increased work of breathing GI: normal findings: soft, non-tender and nondistended Neurologic: Alert and oriented X 3, normal strength and tone. No facial weakness. Speech however is significantly slurred Incision/Wound: clean and dry  Lab Results:   Recent Labs  11/29/14 0505  WBC 9.4  HGB 8.8*  HCT 27.3*  PLT 229   BMET  Recent Labs  11/28/14 1030 11/29/14 0505  NA 136 138  K 3.7 4.0  CL 104 107  CO2 21* 23  GLUCOSE 69 100*  BUN 33* 34*  CREATININE 1.18* 1.17*  CALCIUM 9.5 8.3*     Studies/Results: No results found.  Anti-infectives: Anti-infectives    Start     Dose/Rate Route Frequency Ordered Stop   11/28/14 2300  cefoTEtan (CEFOTAN) 2 g in dextrose 5 % 50 mL IVPB     2  g 100 mL/hr over 30 Minutes Intravenous Every 12 hours 11/28/14 1736 11/28/14 2330   11/28/14 1532  clindamycin (CLEOCIN) 900 mg, gentamicin (GARAMYCIN) 240 mg in sodium chloride 0.9 % 1,000 mL for intraperitoneal lavage  Status:  Discontinued       As needed 11/28/14 1532 11/28/14 1535   11/28/14 1006  cefoTEtan (CEFOTAN) 2 g in dextrose 5 % 50 mL IVPB     2 g 100 mL/hr over 30 Minutes Intravenous On call to O.R. 11/28/14 1006 11/28/14 1305   11/28/14 0600  clindamycin (CLEOCIN) 900 mg, gentamicin (GARAMYCIN) 240 mg in sodium chloride 0.9 % 1,000 mL for intraperitoneal lavage  Status:  Discontinued    Comments:  Pharmacy may adjust dosing strength, schedule, rate of infusion, etc as needed to optimize therapy    Intraperitoneal To Surgery 11/27/14 1323 11/28/14 1643      Assessment/Plan: s/p Procedure(s): XI ROBOT ASSISTED SIGMOID COLECTOMY, REPAIR OF COLOVAGINAL FISTULA,RIGID PROCTOSCOPY Abdomen seems fine without any specific abdominal/surgical complications. Patient has history of mild stroke. Has developed worsening problems with chronic dysphagia this hospitalization and now this morning has new onset of slurred speech. I'm concerned about possible CVA. No other findings on my neuro exam. I will last medicine to evaluate. All her medications have been converted to IV and she is nothing by mouth except ice chips    LOS: 2 days    Kolyn Rozario T 11/30/2014

## 2014-11-30 NOTE — Progress Notes (Addendum)
CSW made aware pt is from [facility]- confirmed with facility that pt is in their ALF  fl2 completed and on pt chart  CSW will continue to follow  Merlyn Lot, [facility] Clinical Social Worker [phone]

## 2014-12-01 ENCOUNTER — Inpatient Hospital Stay (HOSPITAL_COMMUNITY): Payer: Medicare Other

## 2014-12-01 DIAGNOSIS — Z792 Long term (current) use of antibiotics: Secondary | ICD-10-CM

## 2014-12-01 DIAGNOSIS — H9190 Unspecified hearing loss, unspecified ear: Secondary | ICD-10-CM | POA: Insufficient documentation

## 2014-12-01 DIAGNOSIS — J479 Bronchiectasis, uncomplicated: Secondary | ICD-10-CM

## 2014-12-01 DIAGNOSIS — J449 Chronic obstructive pulmonary disease, unspecified: Secondary | ICD-10-CM | POA: Diagnosis present

## 2014-12-01 DIAGNOSIS — R4781 Slurred speech: Secondary | ICD-10-CM

## 2014-12-01 DIAGNOSIS — K219 Gastro-esophageal reflux disease without esophagitis: Secondary | ICD-10-CM | POA: Diagnosis present

## 2014-12-01 DIAGNOSIS — G459 Transient cerebral ischemic attack, unspecified: Secondary | ICD-10-CM | POA: Insufficient documentation

## 2014-12-01 LAB — CBC
HCT: 28.5 % — ABNORMAL LOW (ref 36.0–46.0)
HEMOGLOBIN: 8.9 g/dL — AB (ref 12.0–15.0)
MCH: 30.1 pg (ref 26.0–34.0)
MCHC: 31.2 g/dL (ref 30.0–36.0)
MCV: 96.3 fL (ref 78.0–100.0)
PLATELETS: 236 10*3/uL (ref 150–400)
RBC: 2.96 MIL/uL — ABNORMAL LOW (ref 3.87–5.11)
RDW: 14.9 % (ref 11.5–15.5)
WBC: 10.4 10*3/uL (ref 4.0–10.5)

## 2014-12-01 LAB — BASIC METABOLIC PANEL
Anion gap: 13 (ref 5–15)
BUN: 20 mg/dL (ref 6–20)
CALCIUM: 8.4 mg/dL — AB (ref 8.9–10.3)
CO2: 18 mmol/L — ABNORMAL LOW (ref 22–32)
CREATININE: 0.78 mg/dL (ref 0.44–1.00)
Chloride: 109 mmol/L (ref 101–111)
Glucose, Bld: 52 mg/dL — ABNORMAL LOW (ref 65–99)
Potassium: 3.5 mmol/L (ref 3.5–5.1)
SODIUM: 140 mmol/L (ref 135–145)

## 2014-12-01 MED ORDER — BIOTENE DRY MOUTH MT LIQD
15.0000 mL | Freq: Three times a day (TID) | OROMUCOSAL | Status: DC
  Administered 2014-12-01 – 2014-12-10 (×28): 15 mL via OROMUCOSAL

## 2014-12-01 MED ORDER — METOPROLOL TARTRATE 1 MG/ML IV SOLN
5.0000 mg | INTRAVENOUS | Status: DC | PRN
  Administered 2014-12-01 – 2014-12-09 (×5): 5 mg via INTRAVENOUS
  Filled 2014-12-01 (×6): qty 5

## 2014-12-01 MED ORDER — CEFTAZIDIME 1 G IJ SOLR
1.0000 g | Freq: Two times a day (BID) | INTRAMUSCULAR | Status: DC
  Administered 2014-12-01 – 2014-12-05 (×8): 1 g via INTRAVENOUS
  Filled 2014-12-01 (×9): qty 1

## 2014-12-01 MED ORDER — DILTIAZEM HCL 25 MG/5ML IV SOLN
5.0000 mg | Freq: Once | INTRAVENOUS | Status: AC
  Administered 2014-12-01: 5 mg via INTRAVENOUS
  Filled 2014-12-01: qty 5

## 2014-12-01 MED ORDER — FENTANYL CITRATE (PF) 100 MCG/2ML IJ SOLN
25.0000 ug | INTRAMUSCULAR | Status: DC | PRN
  Administered 2014-12-02 – 2014-12-03 (×2): 50 ug via INTRAVENOUS
  Administered 2014-12-03 – 2014-12-04 (×2): 25 ug via INTRAVENOUS
  Administered 2014-12-04: 50 ug via INTRAVENOUS
  Administered 2014-12-05: 25 ug via INTRAVENOUS
  Filled 2014-12-01 (×6): qty 2

## 2014-12-01 MED ORDER — METOPROLOL TARTRATE 1 MG/ML IV SOLN
10.0000 mg | Freq: Four times a day (QID) | INTRAVENOUS | Status: DC
  Administered 2014-12-01 – 2014-12-05 (×15): 10 mg via INTRAVENOUS
  Filled 2014-12-01 (×15): qty 10

## 2014-12-01 MED ORDER — DEXTROSE-NACL 5-0.9 % IV SOLN
INTRAVENOUS | Status: DC
  Administered 2014-12-01: 22:00:00 via INTRAVENOUS
  Administered 2014-12-01 – 2014-12-02 (×2): 75 mL/h via INTRAVENOUS
  Administered 2014-12-03: 05:00:00 via INTRAVENOUS

## 2014-12-01 NOTE — Care Management Important Message (Signed)
Important Message  Patient Details  Name: Danetta Prom MRN: [id number] Date of Birth: 05-30-1925   Medicare Important Message Given:  Yes-second notification given    Haskell Flirt 12/01/2014, 1:44 PMImportant Message  Patient Details  Name: Avielle Imbert MRN: [id number] Date of Birth: 04-29-1925   Medicare Important Message Given:  Yes-second notification given    Haskell Flirt 12/01/2014, 1:44 PM

## 2014-12-01 NOTE — Progress Notes (Addendum)
[facility] PROGRESS NOTE  Julie Richardson ZOX:[id number] DOB: [birth year] DOA: 11/28/2014 PCP: Darlina Guys, MD  Assessment/Plan: 1. Slurring/Horseness of speech -much better, due to Xerostomia/pharyngeal irritation by ET tube, pain medsetc -MRI negative -possibly start diet today  2. Colovaginal fistula -per CCS  3. H/o chronic Dysphagia/Recurrent aspiration -dysphagia worsened in setting of being post op, pain meds etc, monitor and repeat Speech assessment in 1-2days -MBS per Speech, hopeful to start diet today -CXR unchanged  4. H/o P.Afib -no longer on anticoagulation, was taken off pradaxa by PCP >1year ago, no major bleeding except bruising -start ASA when takes PO -having SVT outbreaks since Sotalol on hold, continue IV lopressor change dose to  and add PRN dose  5. H/o Bronchiectasis and prior pseudomonal infection per son -on long term azithromycin, on Home O2  6. 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 -on chronic prednisone, now on IV solumedrol, transition back to Prednisone at home dose when Po resumed  Code Status: DNR Family Communication: none at bedside, left msg for son Disposition Plan: per  Dr.Gross    HPI/Subjective: Feels ok, wants to eat, voice improving  Objective: Filed Vitals:   12/01/14 1100  BP:   Pulse: 74  Temp:   Resp:     Intake/Output Summary (Last 24 hours) at 12/01/14 1122 Last data filed at 12/01/14 1055  Gross per 24 hour  Intake 1148.33 ml  Output   1200 ml  Net -51.67 ml   Filed Weights   11/28/14 1032 11/30/14 0400 12/01/14 0500  Weight: 43.455 kg (95 lb 12.8 oz) 42.865 kg (94 lb 8 oz) 44.18 kg (97 lb 6.4 oz)    Exam:   General: AAOx3  Cardiovascular: S1S2/regular rate and rhythm  Respiratory: ronchi at R base  Abdomen: soft, mildly distended, BS present, surgical scar  Musculoskeletal: no edema c/c   Data Reviewed: Basic Metabolic Panel:  Recent Labs Lab 11/24/14 1200 11/28/14 1030  11/29/14 0505 12/01/14 0545  NA 141 136 138 140  K 6.3* 3.7 4.0 3.5  CL 102 104 107 109  CO2 32 21* 23 18*  GLUCOSE 110* 69 100* 52*  BUN 30* 33* 34* 20  CREATININE 0.90 1.18* 1.17* 0.78  CALCIUM 10.4* 9.5 8.3* 8.4*  MG  --   --  1.5*  --    Liver Function Tests: No results for input(s): AST, ALT, ALKPHOS, BILITOT, PROT, ALBUMIN in the last 168 hours. No results for input(s): LIPASE, AMYLASE in the last 168 hours. No results for input(s): AMMONIA in the last 168 hours. CBC:  Recent Labs Lab 11/24/14 1200 11/29/14 0505 12/01/14 0545  WBC 7.8 9.4 10.4  HGB 11.8* 8.8* 8.9*  HCT 37.4 27.3* 28.5*  MCV 95.2 92.5 96.3  PLT 257 229 236   Cardiac Enzymes: No results for input(s): CKTOTAL, CKMB, CKMBINDEX, TROPONINI in the last 168 hours. BNP (last 3 results) No results for input(s): BNP in the last 8760 hours.  ProBNP (last 3 results) No results for input(s): PROBNP in the last 8760 hours.  CBG:  Recent Labs Lab 11/28/14 1012  GLUCAP 67    No results found for this or any previous visit (from the past 240 hour(s)).   Studies: Dg Chest 2 View  11/30/2014   CLINICAL DATA:  Chronic bronchiectasis and dyspnea.  EXAM: CHEST  2 VIEW  COMPARISON:  04/26/2014  FINDINGS: Cardiomediastinal silhouette is normal. Mediastinal contours appear intact. Aorta is torturous.  There is no evidence of pneumothorax. There is  bibasilar atelectasis. The previously seen areas of bronchiectasis and peribronchial thickening have improved but not completely resolved radiographically. There may be small bilateral pleural effusions.  Osseous structures are without acute abnormality. There is compression deformity of 2 of the mid to lower thoracic vertebral bodies, stable from CT dated 03/29/2014. Soft tissues are grossly normal.  IMPRESSION: Persistent areas of bronchiectasis and peribronchial thickening, with lower lobe predominance.  Bibasilar atelectasis.  Possible small bilateral pleural effusions.    Electronically Signed   By: Ted Mcalpine M.D.   On: 11/30/2014 12:17   Ct Head Wo Contrast  11/30/2014   CLINICAL DATA:  Reason surgery with new onset of slurred speech postoperatively.  EXAM: CT HEAD WITHOUT CONTRAST  TECHNIQUE: Contiguous axial images were obtained from the base of the skull through the vertex without intravenous contrast.  COMPARISON:  MRI same day.  Head CT 04/26/2014  FINDINGS: No sign of acute infarction. There is mild generalized brain atrophy. There are mild chronic small-vessel ischemic changes within the white matter. There are old lacunar infarctions in the right basal ganglia and thalamus. Physiologic calcification is present in the basal ganglia bilaterally. The patient has benign dural calcification. No mass lesion, hemorrhage, hydrocephalus or extra-axial collection. The calvarium is unremarkable. There is extensive opacification of the maxillary sinuses consistent with sinusitis. There is atherosclerotic calcification of the major vessels at the base of the brain.  IMPRESSION: Atrophy and chronic small vessel disease. No acute intracranial finding.  Maxillary sinusitis.   Electronically Signed   By: Paulina Fusi M.D.   On: 11/30/2014 13:55   Mr Brain Wo Contrast  11/30/2014   CLINICAL DATA:  New onset slurred speech.  Recent colectomy.  EXAM: MRI HEAD WITHOUT CONTRAST  TECHNIQUE: Multiplanar, multiecho pulse sequences of the brain and surrounding structures were obtained without intravenous contrast.  COMPARISON:  Head CT 04/26/2014  FINDINGS: There is no evidence of acute infarct, intracranial hemorrhage, mass, midline shift, or extra-axial fluid collection. There is moderate generalized cerebral atrophy. T2 hyperintensities in the subcortical and deep cerebral white matter and pons are nonspecific but compatible with mild chronic small vessel ischemic disease. A chronic right basal ganglia lacunar infarct is again noted.  Prior right cataract extraction is noted.  Chronic bilateral maxillary sinusitis is again noted with likely proteinaceous/inspissated material filling both sinuses. There is also mild left ethmoid air cell mucosal thickening. A trace right mastoid effusion is noted. Major intracranial vascular flow voids are preserved.  IMPRESSION: 1. No acute intracranial abnormality. 2. Mild chronic small vessel ischemic disease. Chronic right basal ganglia lacunar infarct.   Electronically Signed   By: Sebastian Ache M.D.   On: 11/30/2014 13:31    Scheduled Meds: . antiseptic oral rinse  15 mL Mouth Rinse TID  . cycloSPORINE  1 drop Both Eyes BID  . enoxaparin (LOVENOX) injection  30 mg Subcutaneous Q24H  . erythromycin  1 application Both Eyes QHS  . hydrocortisone sod succinate (SOLU-CORTEF) inj  20 mg Intravenous Daily  . ipratropium-albuterol  3 mL Nebulization BID  . lip balm  1 application Topical BID  . metoprolol  10 mg Intravenous 4 times per day  . pantoprazole (PROTONIX) IV  40 mg Intravenous QHS  . RESOURCE THICKENUP CLEAR   Oral TID WC  . saccharomyces boulardii  250 mg Oral BID  . sodium chloride  3 mL Intravenous Q12H   Continuous Infusions: . dextrose 5 % and 0.9% NaCl 75 mL/hr (12/01/14 0818)   Antibiotics Given (last  72 hours)    Date/Time Action Medication Dose Rate   11/28/14 1532 Given   clindamycin (CLEOCIN) 900 mg, gentamicin (GARAMYCIN) 240 mg in sodium chloride 0.9 % 1,000 mL for intraperitoneal lavage     11/28/14 2300 Given   cefoTEtan (CEFOTAN) 2 g in dextrose 5 % 50 mL IVPB 2 g 100 mL/hr      Principal Problem:   Colovaginal fistula s/p robotic colectomy/repair 11/28/2014 Active Problems:   Slurring of speech   HOH (hard of hearing)   COPD (chronic obstructive pulmonary disease) (HCC)   TIA (transient ischemic attack)   GERD (gastroesophageal reflux disease)    Time spent:    [facility]  [facility] Pager [phone]. If 7PM-7AM, please contact night-coverage at [email or web address], password  [email or web address] 12/01/2014, 11:22 AM  LOS: 3 days

## 2014-12-01 NOTE — Progress Notes (Signed)
Speech Language Pathology Treatment: Dysphagia  Patient Details Name: Julie Richardson MRN: [id number] DOB: 1925/06/16 Today's Date: 12/01/2014 Time: 1150-1202 SLP Time Calculation (min) (ACUTE ONLY): 12 min  Assessment / Plan / Recommendation Clinical Impression  Patient continues to present with evidence of a severe pharyngo-esophageal dysphagia with therapeutic po trials at bedside as evidenced by multiple swallows, immediate wet vocal quality, throat clearing and cough with expectoration of each bolus provided despite presentation or consistency. No significant improvement in function noted based on previous SLP notes. At this point, patient is 3 days post-op and intubated for surgery only. Would expect to see an increase in functional recovery. Recommend proceeding with MBS to more completely assess swallowing to determine potential to any pos with use of strategies or possible helpful intervention.    HPI Other Pertinent Information: Pt is s/p XI robot assisted SIGMOID COLECTOMY, REPAIR OF COLOVAGINAL FISTULA,RIGID PROCTOSCOPY.   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 (overnight in 08/2013).  Pt reports using nectar thick liquids over the last year or so.  Swallow eval ordered as pt reports difficulty swallowing medicine - even when crushed.     Pertinent Vitals Pain Assessment: No/denies pain  SLP Plan  MBS    Recommendations Diet recommendations: NPO (except ice chips after oral care) Medication Administration: Via alternative means Supervision: Patient able to self feed Postural Changes and/or Swallow Maneuvers: Seated upright 90 degrees              Oral Care Recommendations: Oral care QID Follow up Recommendations:  (TBD) Plan: MBS    GO   Ferdinand Lango MA, CCC-SLP [phone]   Tonnette Zwiebel Meryl 12/01/2014, 1:13 PM

## 2014-12-01 NOTE — Progress Notes (Signed)
Patient's HR was at SVT, sustaining at 140-160's. Patient was asymptomatic. BP is now better after a bolus of NS. Cardizem  IV given with good result. HR now at 80's NSR.

## 2014-12-01 NOTE — Progress Notes (Signed)
Called by Ferdinand Lango with SLP speech therapy.  Modified barium swallow strongly suspicious for upper esophageal sphincter dysfunction/stricture/obstruction.  Risk for aspiration very high as a result.  Worse than evaluation last year.  Recommended more aggressive evaluation.  Call discussed with Dr. Brynda Peon with [facility].  Like to try and get Swallow eval first if possible.  Called and discussed with radiology Dr Paralee Cancel whom recommended x-ray to rule out retained barium and trial barium swallow.  Go slowly and gently.  I ordered for the morning.  Radiology to hellp coordinate.  May need to have gastroenterology do EGD as well, but hold off for now.  Ardeth Sportsman, M.D., F.A.C.S. Gastrointestinal and Minimally Invasive Surgery [facility] [address] [place], [place] [address] [phone] Main / Paging

## 2014-12-01 NOTE — Progress Notes (Signed)
MBSS complete. Full report located under chart review in imaging section.  Mearl Olver MA, CCC-SLP [phone]   

## 2014-12-01 NOTE — Progress Notes (Signed)
PT Cancellation Note  Patient Details Name: Julie Richardson MRN: [id number] DOB: 07/14/1925   Cancelled Treatment:     MBSS out of room then requested to rest.   Armando Reichert 12/01/2014, 3:28 PM

## 2014-12-01 NOTE — Progress Notes (Addendum)
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 -inc HR - may be w/d from no PO cardiac meds -HTN control - challenge w NPO.   -chronic brochiectasis - give IV Ceftazidime w NPO & cannot take usual Azithromycin & h/o Pseudomonas bronchiectasis with WBC increasing  -IV steroid coberage baseline for now until can take PO -VTE prophylaxis- SCDs, etc -mobilize as tolerated to help recovery  Adin Hector, M.D., F.A.C.S. Gastrointestinal and Minimally Invasive Surgery [facility] [address] [place], [place] [address] [phone] Main / Paging   12/01/2014  Subjective:  Had bad dream & had inc HR Walking more C/o dry mouth Wants to eat  Objective:  Vital signs:  Filed Vitals:   11/30/14 2305 12/01/14 0355 12/01/14 0500 12/01/14 0600  BP: 101/46 118/73  94/70  Pulse: 107 142  73  Temp:    97.5 F (36.4 C)  TempSrc:    Oral  Resp:      Height:      Weight:   44.18 kg (97 lb 6.4 oz)   SpO2:    98%    Last BM Date: 11/26/14  Intake/Output   Yesterday:  10/09 0701 - 10/10  0700 In: 1548.3 [I.V.:1548.3] Out: 700 [Urine:700] This shift:     Bowel function:  Flatus: y  BM: n  Drain: n/a  Physical Exam:  General: Pt awake/alert/oriented x4 in no acute distress Eyes: PERRL, normal EOM.  Sclera clear.  No icterus Neuro: CN II-XII intact w/o focal sensory/motor deficits. Lymph: No head/neck/groin lymphadenopathy Psych:  No delerium/psychosis/paranoia HENT: Normocephalic, Mucus membranes dry.  No thrush.  Remains HOH Neck: Supple, No tracheal deviation Chest: No chest wall pain w good excursion CV:  Pulses intact.  Regular rhythm MS: Normal AROM mjr joints.  No obvious deformity Abdomen: Soft.  Nondistended.  Mildly tender at incisions only.  No evidence of peritonitis.  No incarcerated hernias. Ext:  SCDs BLE.  No mjr edema.  No cyanosis Skin: No petechiae / purpura  Results:   Labs: Results for orders placed or performed during the hospital encounter of 11/28/14 (from the past 48 hour(s))  CBC     Status: Abnormal   Collection Time: 12/01/14  5:45 AM  Result Value Ref Range   WBC 10.4 4.0 - 10.5 K/uL   RBC 2.96 (L) 3.87 - 5.11 MIL/uL   Hemoglobin 8.9 (L) 12.0 - 15.0 g/dL   HCT 28.5 (L) 36.0 - 46.0 %   MCV 96.3 78.0 - 100.0 fL   MCH 30.1 26.0 - 34.0 pg   MCHC 31.2 30.0 - 36.0 g/dL   RDW 14.9 11.5 - 15.5 %   Platelets 236 150 - 400 K/uL  Basic metabolic panel     Status: Abnormal   Collection Time: 12/01/14  5:45 AM  Result Value Ref Range   Sodium 140 135 - 145 mmol/L   Potassium 3.5 3.5 - 5.1 mmol/L   Chloride 109 101 - 111 mmol/L   CO2 18 (L) 22 - 32 mmol/L   Glucose, Bld 52 (L) 65 - 99 mg/dL   BUN 20 6 - 20 mg/dL   Creatinine, Ser 0.78 0.44 - 1.00 mg/dL   Calcium 8.4 (L) 8.9 - 10.3 mg/dL   GFR calc non Af Amer >60 >60 mL/min   GFR calc Af Amer >60 >60 mL/min    Comment: (NOTE) The eGFR has been calculated using the CKD EPI equation. This calculation has not been validated in all clinical situations. eGFR's persistently <60 mL/min  signify possible Chronic Kidney Disease.    Anion gap 13 5 - 15    Imaging / Studies: Dg Chest 2 View  11/30/2014   CLINICAL DATA:  Chronic bronchiectasis and dyspnea.  EXAM: CHEST  2 VIEW  COMPARISON:  04/26/2014  FINDINGS: Cardiomediastinal silhouette is normal. Mediastinal contours appear intact. Aorta is torturous.  There is no evidence of pneumothorax. There is bibasilar atelectasis. The previously seen areas of bronchiectasis and peribronchial thickening have improved but not completely resolved radiographically. There may be small bilateral pleural effusions.  Osseous structures are without acute abnormality. There is compression deformity of 2 of the mid to lower thoracic vertebral bodies, stable from CT dated 03/29/2014. Soft tissues are grossly normal.  IMPRESSION: Persistent areas of bronchiectasis and peribronchial thickening, with lower lobe predominance.  Bibasilar atelectasis.  Possible small bilateral pleural effusions.   Electronically Signed   By: Fidela Salisbury M.D.   On: 11/30/2014 12:17   Ct Head Wo Contrast  11/30/2014   CLINICAL DATA:  Reason surgery with new onset of slurred speech postoperatively.  EXAM: CT HEAD WITHOUT CONTRAST  TECHNIQUE: Contiguous axial images were obtained from the base of the skull through the vertex without intravenous contrast.  COMPARISON:  MRI same day.  Head CT 04/26/2014  FINDINGS: No sign of acute infarction. There is mild generalized brain atrophy. There are mild chronic small-vessel ischemic changes within the white matter. There are old lacunar infarctions in the right basal ganglia and thalamus. Physiologic calcification is present in the basal ganglia bilaterally. The patient has benign dural calcification. No mass lesion, hemorrhage, hydrocephalus or extra-axial collection. The calvarium is unremarkable. There is extensive opacification of the maxillary sinuses consistent with sinusitis. There is atherosclerotic calcification of the major  vessels at the base of the brain.  IMPRESSION: Atrophy and chronic small vessel disease. No acute intracranial finding.  Maxillary sinusitis.   Electronically Signed   By: Nelson Chimes M.D.   On: 11/30/2014 13:55   Mr Brain Wo Contrast  11/30/2014   CLINICAL DATA:  New onset slurred speech.  Recent colectomy.  EXAM: MRI HEAD WITHOUT CONTRAST  TECHNIQUE: Multiplanar, multiecho pulse sequences of the brain and surrounding structures were obtained without intravenous contrast.  COMPARISON:  Head CT 04/26/2014  FINDINGS: There is no evidence of acute infarct, intracranial hemorrhage, mass, midline shift, or extra-axial fluid collection. There is moderate generalized cerebral atrophy. T2 hyperintensities in the subcortical and deep cerebral white matter and pons are nonspecific but compatible with mild chronic small vessel ischemic disease. A chronic right basal ganglia lacunar infarct is again noted.  Prior right cataract extraction is noted. Chronic bilateral maxillary sinusitis is again noted with likely  proteinaceous/inspissated material filling both sinuses. There is also mild left ethmoid air cell mucosal thickening. A trace right mastoid effusion is noted. Major intracranial vascular flow voids are preserved.  IMPRESSION: 1. No acute intracranial abnormality. 2. Mild chronic small vessel ischemic disease. Chronic right basal ganglia lacunar infarct.   Electronically Signed   By: Logan Bores M.D.   On: 11/30/2014 13:31    Medications / Allergies: per chart  Antibiotics: Anti-infectives    Start     Dose/Rate Route Frequency Ordered Stop   11/28/14 2300  cefoTEtan (CEFOTAN) 2 g in dextrose 5 % 50 mL IVPB     2 g 100 mL/hr over 30 Minutes Intravenous Every 12 hours 11/28/14 1736 11/28/14 2330   11/28/14 1532  clindamycin (CLEOCIN) 900 mg, gentamicin (GARAMYCIN) 240 mg in sodium chloride 0.9 % 1,000 mL for intraperitoneal lavage  Status:  Discontinued       As needed 11/28/14 1532 11/28/14 1535    11/28/14 1006  cefoTEtan (CEFOTAN) 2 g in dextrose 5 % 50 mL IVPB     2 g 100 mL/hr over 30 Minutes Intravenous On call to O.R. 11/28/14 1006 11/28/14 1305   11/28/14 0600  clindamycin (CLEOCIN) 900 mg, gentamicin (GARAMYCIN) 240 mg in sodium chloride 0.9 % 1,000 mL for intraperitoneal lavage  Status:  Discontinued    Comments:  Pharmacy may adjust dosing strength, schedule, rate of infusion, etc as needed to optimize therapy    Intraperitoneal To Surgery 11/27/14 1323 11/28/14 1643        Note: Portions of this report may have been transcribed using voice recognition software. Every effort was made to ensure accuracy; however, inadvertent computerized transcription errors may be present.   Any transcriptional errors that result from this process are unintentional.     Adin Hector, M.D., F.A.C.S. Gastrointestinal and Minimally Invasive Surgery [facility] [address], [place], [place] [address] [phone] Main / Paging   12/01/2014  CARE TEAM:  PCP: Willodean Rosenthal, MD  Outpatient Care Team: Patient Care Team: Willodean Rosenthal, MD as PCP - General (Internal Medicine) Bjorn Loser, MD as Consulting Physician (Urology) Michael Boston, MD as Consulting Physician (General Surgery) Teena Irani, MD as Consulting Physician (Gastroenterology)  Inpatient Treatment Team: Treatment Team: Attending Provider: Michael Boston, MD; Registered Nurse: Bailey Mech, RN; Consulting Physician: Ian Bushman, MD; Technician: Larene Beach, NT; Speech Language Pathologist: Eloise Harman, CCC-SLP; Registered Nurse: Joellen Jersey, RN; Technician: Coralie Carpen, NT; Technician: Mertha Baars, NT

## 2014-12-02 ENCOUNTER — Inpatient Hospital Stay (HOSPITAL_COMMUNITY): Payer: Medicare Other

## 2014-12-02 DIAGNOSIS — H9193 Unspecified hearing loss, bilateral: Secondary | ICD-10-CM

## 2014-12-02 DIAGNOSIS — E876 Hypokalemia: Secondary | ICD-10-CM | POA: Diagnosis not present

## 2014-12-02 LAB — CBC
HCT: 30 % — ABNORMAL LOW (ref 36.0–46.0)
HEMOGLOBIN: 9.6 g/dL — AB (ref 12.0–15.0)
MCH: 29.5 pg (ref 26.0–34.0)
MCHC: 32 g/dL (ref 30.0–36.0)
MCV: 92.3 fL (ref 78.0–100.0)
PLATELETS: 235 10*3/uL (ref 150–400)
RBC: 3.25 MIL/uL — AB (ref 3.87–5.11)
RDW: 14.3 % (ref 11.5–15.5)
WBC: 7.4 10*3/uL (ref 4.0–10.5)

## 2014-12-02 LAB — BASIC METABOLIC PANEL
Anion gap: 7 (ref 5–15)
BUN: 11 mg/dL (ref 6–20)
CHLORIDE: 104 mmol/L (ref 101–111)
CO2: 25 mmol/L (ref 22–32)
CREATININE: 0.73 mg/dL (ref 0.44–1.00)
Calcium: 8.2 mg/dL — ABNORMAL LOW (ref 8.9–10.3)
Glucose, Bld: 157 mg/dL — ABNORMAL HIGH (ref 65–99)
POTASSIUM: 3 mmol/L — AB (ref 3.5–5.1)
SODIUM: 136 mmol/L (ref 135–145)

## 2014-12-02 LAB — MAGNESIUM: MAGNESIUM: 1.5 mg/dL — AB (ref 1.7–2.4)

## 2014-12-02 MED ORDER — MAGNESIUM SULFATE 2 GM/50ML IV SOLN
2.0000 g | Freq: Once | INTRAVENOUS | Status: AC
  Administered 2014-12-02: 2 g via INTRAVENOUS
  Filled 2014-12-02: qty 50

## 2014-12-02 MED ORDER — LIDOCAINE HCL 4 % EX SOLN
0.0000 mL | Freq: Once | CUTANEOUS | Status: DC | PRN
  Filled 2014-12-02: qty 50

## 2014-12-02 MED ORDER — INFLUENZA VAC SPLIT QUAD 0.5 ML IM SUSY
0.5000 mL | PREFILLED_SYRINGE | INTRAMUSCULAR | Status: AC
  Administered 2014-12-03: 0.5 mL via INTRAMUSCULAR
  Filled 2014-12-02 (×2): qty 0.5

## 2014-12-02 MED ORDER — POTASSIUM CHLORIDE 10 MEQ/100ML IV SOLN
10.0000 meq | INTRAVENOUS | Status: AC
  Administered 2014-12-02 (×4): 10 meq via INTRAVENOUS
  Filled 2014-12-02 (×3): qty 100

## 2014-12-02 MED ORDER — OXYMETAZOLINE HCL 0.05 % NA SOLN
1.0000 | Freq: Once | NASAL | Status: DC | PRN
  Filled 2014-12-02: qty 15

## 2014-12-02 MED ORDER — DILTIAZEM HCL 25 MG/5ML IV SOLN
10.0000 mg | Freq: Once | INTRAVENOUS | Status: DC
  Filled 2014-12-02: qty 5

## 2014-12-02 MED ORDER — POTASSIUM CHLORIDE CRYS ER 20 MEQ PO TBCR: 40.0000 meq | EXTENDED_RELEASE_TABLET | Freq: Every day | ORAL | Status: DC

## 2014-12-02 MED ORDER — LIDOCAINE HCL 2 % EX GEL
1.0000 "application " | Freq: Once | CUTANEOUS | Status: DC | PRN
  Filled 2014-12-02: qty 5

## 2014-12-02 NOTE — Progress Notes (Addendum)
Speech Language Pathology Treatment: Dysphagia  Patient Details Name: Julie Richardson MRN: [id number] DOB: [birth year] Today's Date: 12/02/2014 Time: 4098-1191 SLP Time Calculation (min) (ACUTE ONLY): 9 min  Assessment / Plan / Recommendation Clinical Impression  Follow up with pt regarding her severe dysphagia, note results of esophagram indicating abnormalities in pharyngeal swallow resulting in difficulty propelling the bolus into the esophagus per barium swallow.   Pt reports severe difficulties swallowing and is aware of nutritional concerns/risks.    Per pt, she was informed (in xray) that she may need feeding tube and she is willing to have feeding tube placed in her stomach- she does not want small-bore nasogastric feeding tube.  SLP phoned hospitalist MD who requested I call Dr Michaell Cowing given she is his primary pt.  Dr Michaell Cowing' office reports he is off this pm and message was relayed to Dr Lovell Sheehan by office staff.  SLP passed information on to RN to relay to MD in hopes to facilitate pt's care plan.    If concern for possible cervical esophageal deficits - ? Poor UES opening, question if pt may benefit from consideration for endoscopy with manometry if ENT/GI agrees.   ? If could be done at the same time as long term tube being placed.   Informed pt to information relayed re: pt willingness to receive feeding tube.  SLP to follow to assist with pt care plan.    HPI Other Pertinent Information: Pt is s/p XI robot assisted SIGMOID COLECTOMY, REPAIR OF COLOVAGINAL FISTULA,RIGID PROCTOSCOPY.   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 (overnight in 08/2013).  Pt reports using nectar thick liquids over the last year or so.  Swallow eval ordered as pt reports difficulty swallowing medicine - even when crushed.     Pertinent Vitals Pain Assessment: No/denies pain  SLP Plan  Continue with current plan of care    Recommendations  Diet recommendations: NPO (ice chips) Liquids provided via: Teaspoon (continue single ice chips ) Compensations: Slow rate;Small sips/bites              Follow up Recommendations: Skilled Nursing facility (after treatment for proximal esophageal dysphagia completed if indicated) Plan: Continue with current plan of care    GO     Donavan Burnet, MS [facility] SLP [phone]

## 2014-12-02 NOTE — Progress Notes (Signed)
[facility]  [address] [place], [place] [address] Phone: [phone] FAX: [place] [phone] 10-06-1925   Problem List:   Principal Problem:   Colovaginal fistula s/p robotic colectomy/repair 11/28/2014 Active Problems:   Slurring of speech   HOH (hard of hearing)   COPD (chronic obstructive pulmonary disease) (HCC)   TIA (transient ischemic attack)   GERD (gastroesophageal reflux disease)   Bronchiectasis with Pseudomonas without acute exacerbation (HCC)   Chronic antibiotic suppression - Azithromycin for chronic bronchiectasis with Pseudomonas   4 Days Post-Op  11/28/2014  Procedure(s): XI ROBOT ASSISTED SIGMOID COLECTOMY, REPAIR OF COLOVAGINAL FISTULA,RIGID PROCTOSCOPY  Assessment  Severe dysphagia near UES of uncertain etiology  Plan:  -severe dysphagia - d/w SLP & ENT (Dr Constance Holster).  Getting Barium swallow this AM - d/w radiology to check CXR 1st & go slowly to min risk of aspiration but get sense of anatomy.  ?May need EGD vs laryngoscopy.  -ileus resolved from colectomy/colovaginal fistula repair - adv diet when safe to eat  -f/u pathology  -hypoK - replacing  -hypoMag - replacing  -IVFs  -changed narcotics w bad dreams & mild grogginess - MS better  -inc HR controlled w IV metoprolol  -HTN control - challenge w NPO.    -chronic brochiectasis - give IV Ceftazidime w NPO & cannot take usual Azithromycin & h/o Pseudomonas bronchiectasis with WBC increasing   -IV steroid coverage baseline for now until can take PO  -VTE prophylaxis- SCDs, etc  -mobilize as tolerated to help recovery  I updated the patient's status to the patient & her RN.  Recommendations were made.  Questions were answered.  The patient & RN expressed understanding & appreciation.   Adin Hector, M.D., F.A.C.S. Gastrointestinal and Minimally Invasive Surgery [facility] [address], [place], [place] [address] [phone] Main / Paging   12/02/2014  Subjective:  Had bad dream & had inc HR Walking more C/o dry mouth Wants to eat  Objective:  Vital signs:  Filed Vitals:   12/01/14 2104 12/01/14 2215 12/01/14 2259 12/02/14 0447  BP: 146/76  111/84 112/86  Pulse: 78 86 129 76  Temp: 98.2 F (36.8 C)   98.2 F (36.8 C)  TempSrc: Oral   Oral  Resp: 18 18  18   Height:      Weight:    44.135 kg (97 lb 4.8 oz)  SpO2: 98% 97%  98%    Last BM Date: 12/01/14 (patient said she had a small amount today)  Intake/Output   Yesterday:  10/10 0701 - 10/11 0700 In: 1152.5 [I.V.:1102.5; IV Piggyback:50] Out: 9675 [Urine:2300] This shift:  Total I/O In: 1152.5 [I.V.:1102.5; IV Piggyback:50] Out: 1200 [Urine:1200]  Bowel function:  Flatus: y  BM: n  Drain: n/a  Physical Exam:  General: Pt awake/alert/oriented x4 in no acute distress Eyes: PERRL, normal EOM.  Sclera clear.  No icterus Neuro: CN II-XII intact w/o focal sensory/motor deficits. Lymph: No head/neck/groin lymphadenopathy Psych:  No delerium/psychosis/paranoia HENT: Normocephalic, Mucus membranes dry.  No thrush.  Remains HOH Neck: Supple, No tracheal deviation Chest: No chest wall pain w good excursion CV:  Pulses intact.  Regular rhythm MS: Normal AROM mjr joints.  No obvious deformity Abdomen: Soft.  Nondistended.  Mildly tender at incisions only.  No evidence of peritonitis.  No incarcerated hernias. Ext:  SCDs BLE.  No mjr edema.  No cyanosis Skin: No petechiae / purpura  Results:  Labs: Results for orders placed or performed during the hospital encounter of 11/28/14 (from the past 48 hour(s))  CBC     Status: Abnormal   Collection Time: 12/01/14  5:45 AM  Result Value Ref Range   WBC 10.4 4.0 - 10.5 K/uL   RBC 2.96 (L) 3.87 - 5.11 MIL/uL   Hemoglobin 8.9 (L) 12.0 - 15.0 g/dL   HCT 28.5 (L) 36.0 - 46.0 %   MCV 96.3 78.0 - 100.0 fL   MCH 30.1 26.0 - 34.0 pg   MCHC 31.2  30.0 - 36.0 g/dL   RDW 14.9 11.5 - 15.5 %   Platelets 236 150 - 400 K/uL  Basic metabolic panel     Status: Abnormal   Collection Time: 12/01/14  5:45 AM  Result Value Ref Range   Sodium 140 135 - 145 mmol/L   Potassium 3.5 3.5 - 5.1 mmol/L   Chloride 109 101 - 111 mmol/L   CO2 18 (L) 22 - 32 mmol/L   Glucose, Bld 52 (L) 65 - 99 mg/dL   BUN 20 6 - 20 mg/dL   Creatinine, Ser 0.78 0.44 - 1.00 mg/dL   Calcium 8.4 (L) 8.9 - 10.3 mg/dL   GFR calc non Af Amer >60 >60 mL/min   GFR calc Af Amer >60 >60 mL/min    Comment: (NOTE) The eGFR has been calculated using the CKD EPI equation. This calculation has not been validated in all clinical situations. eGFR's persistently <60 mL/min signify possible Chronic Kidney Disease.    Anion gap 13 5 - 15  CBC     Status: Abnormal   Collection Time: 12/02/14  4:27 AM  Result Value Ref Range   WBC 7.4 4.0 - 10.5 K/uL   RBC 3.25 (L) 3.87 - 5.11 MIL/uL   Hemoglobin 9.6 (L) 12.0 - 15.0 g/dL   HCT 30.0 (L) 36.0 - 46.0 %   MCV 92.3 78.0 - 100.0 fL   MCH 29.5 26.0 - 34.0 pg   MCHC 32.0 30.0 - 36.0 g/dL   RDW 14.3 11.5 - 15.5 %   Platelets 235 150 - 400 K/uL  Basic metabolic panel     Status: Abnormal   Collection Time: 12/02/14  4:27 AM  Result Value Ref Range   Sodium 136 135 - 145 mmol/L   Potassium 3.0 (L) 3.5 - 5.1 mmol/L   Chloride 104 101 - 111 mmol/L   CO2 25 22 - 32 mmol/L   Glucose, Bld 157 (H) 65 - 99 mg/dL   BUN 11 6 - 20 mg/dL   Creatinine, Ser 0.73 0.44 - 1.00 mg/dL   Calcium 8.2 (L) 8.9 - 10.3 mg/dL   GFR calc non Af Amer >60 >60 mL/min   GFR calc Af Amer >60 >60 mL/min    Comment: (NOTE) The eGFR has been calculated using the CKD EPI equation. This calculation has not been validated in all clinical situations. eGFR's persistently <60 mL/min signify possible Chronic Kidney Disease.    Anion gap 7 5 - 15  Magnesium     Status: Abnormal   Collection Time: 12/02/14  4:27 AM  Result Value Ref Range   Magnesium 1.5 (L) 1.7 -  2.4 mg/dL    Imaging / Studies: Dg Chest 2 View  11/30/2014   CLINICAL DATA:  Chronic bronchiectasis and dyspnea.  EXAM: CHEST  2 VIEW  COMPARISON:  04/26/2014  FINDINGS: Cardiomediastinal silhouette is normal. Mediastinal contours appear intact. Aorta is torturous.  There is no evidence of  pneumothorax. There is bibasilar atelectasis. The previously seen areas of bronchiectasis and peribronchial thickening have improved but not completely resolved radiographically. There may be small bilateral pleural effusions.  Osseous structures are without acute abnormality. There is compression deformity of 2 of the mid to lower thoracic vertebral bodies, stable from CT dated 03/29/2014. Soft tissues are grossly normal.  IMPRESSION: Persistent areas of bronchiectasis and peribronchial thickening, with lower lobe predominance.  Bibasilar atelectasis.  Possible small bilateral pleural effusions.   Electronically Signed   By: Fidela Salisbury M.D.   On: 11/30/2014 12:17   Ct Head Wo Contrast  11/30/2014   CLINICAL DATA:  Reason surgery with new onset of slurred speech postoperatively.  EXAM: CT HEAD WITHOUT CONTRAST  TECHNIQUE: Contiguous axial images were obtained from the base of the skull through the vertex without intravenous contrast.  COMPARISON:  MRI same day.  Head CT 04/26/2014  FINDINGS: No sign of acute infarction. There is mild generalized brain atrophy. There are mild chronic small-vessel ischemic changes within the white matter. There are old lacunar infarctions in the right basal ganglia and thalamus. Physiologic calcification is present in the basal ganglia bilaterally. The patient has benign dural calcification. No mass lesion, hemorrhage, hydrocephalus or extra-axial collection. The calvarium is unremarkable. There is extensive opacification of the maxillary sinuses consistent with sinusitis. There is atherosclerotic calcification of the major vessels at the base of the brain.  IMPRESSION: Atrophy and  chronic small vessel disease. No acute intracranial finding.  Maxillary sinusitis.   Electronically Signed   By: Nelson Chimes M.D.   On: 11/30/2014 13:55   Mr Brain Wo Contrast  11/30/2014   CLINICAL DATA:  New onset slurred speech.  Recent colectomy.  EXAM: MRI HEAD WITHOUT CONTRAST  TECHNIQUE: Multiplanar, multiecho pulse sequences of the brain and surrounding structures were obtained without intravenous contrast.  COMPARISON:  Head CT 04/26/2014  FINDINGS: There is no evidence of acute infarct, intracranial hemorrhage, mass, midline shift, or extra-axial fluid collection. There is moderate generalized cerebral atrophy. T2 hyperintensities in the subcortical and deep cerebral white matter and pons are nonspecific but compatible with mild chronic small vessel ischemic disease. A chronic right basal ganglia lacunar infarct is again noted.  Prior right cataract extraction is noted. Chronic bilateral maxillary sinusitis is again noted with likely proteinaceous/inspissated material filling both sinuses. There is also mild left ethmoid air cell mucosal thickening. A trace right mastoid effusion is noted. Major intracranial vascular flow voids are preserved.  IMPRESSION: 1. No acute intracranial abnormality. 2. Mild chronic small vessel ischemic disease. Chronic right basal ganglia lacunar infarct.   Electronically Signed   By: Logan Bores M.D.   On: 11/30/2014 13:31   Dg Swallowing Func-speech Pathology  12/01/2014    Objective Swallowing Evaluation:   MBS Patient Details  Name: Julie Richardson MRN: [id number] Date of Birth: [birth year]  Today's Date: 12/01/2014 Time: SLP Start Time (ACUTE ONLY): 1348-SLP Stop Time (ACUTE ONLY): 1406 SLP Time Calculation (min) (ACUTE ONLY): 18 min  Past Medical History:  Past Medical History  Diagnosis Date  . HOH (hard of hearing)   . Anemia   . Diabetes mellitus without complication ([place])   . History of home oxygen therapy     uses 2 liters at hs and prn during day  . Atrial  fibrillation (HCC)     hx of  . Spinal stenosis   . GERD (gastroesophageal reflux disease)   . Chronic kidney disease     stage 3  .  Arthritis   . Dysphagia     uses thickened liquids  . Hyperlipidemia   . Pelvis fracture ([place]) 6 years ago  . Complication of anesthesia     slow to wake up  . Blind left eye   . HOH (hard of hearing)   . TIA (transient ischemic attack)     7 YEARS AGO, NONE SINCE  . Pneumonia oct 2015    ASPIRATION PNEUMONIA X 3- 4 TIMES IN PAST  . COPD (chronic obstructive pulmonary disease) (HCC)     pseodonomonus bronchiectasis   Past Surgical History:  Past Surgical History  Procedure Laterality Date  . Abdominal hysterectomy    . Right shoulder surgery  years ago    for fracture  . Tonsillectomy  as child   HPI:  Other Pertinent Information: Pt is s/p XI robot assisted SIGMOID  COLECTOMY, REPAIR OF COLOVAGINAL FISTULA,RIGID PROCTOSCOPY.   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 (overnight in 08/2013).  Pt reports  using nectar thick liquids over the last year or so.  Swallow eval ordered  as pt reports difficulty swallowing medicine - even when crushed.    No Data Recorded  Assessment / Plan / Recommendation CHL IP CLINICAL IMPRESSIONS 12/01/2014  Therapy Diagnosis Suspected primary esophageal dysphagia;Mild pharyngeal  phase dysphagia  Clinical Impression Patient presents with a suspected primary esophageal  dysphagia with mild pharyngeal component. Oral phase WFL. Timing of  swallow adequate. Anterior hyo-laryngeal movement appearing mildly reduced  by not significant enough to account for severely impaired opening of the  UES. Boluses of all consistencies trialed sit in pyriform sinuses post  swallow with little-no clearance of bolus into the esophagus. SLP  instructed patient for a variety of head postures (head turns, tilts, and  chin tucks) without change in function. Patient with eventual penetration  and  aspiration of consistencies provided with intact sensation and  clearance of airway but inability to clear via esophagus requiring  expectoration of bolus. Swallow is at this time non-functional given  little-no clearance of bolus into esophagus. Per patient and MBS note from  2015, dysphagia only mild and functional. Patient was consuming po prior  to admission. Of note, discussion with Dr. Broadus John revealed that patient  has had multiple recent admissions to [facility] for  aspiration PNA. At this time, while brief intubation and deconditioning  may account for some decline in dysfunction, question if primary  esophageal deficit is to blame for current level of dysfunction. Unclear  baseline further complicates diagnostics. ENT or GI consult may be  beneficial in ruling out primary UES dysfunction and determining potential  for intervention to improve. SLP will continue to f/u at bedside to  monitor for a facilitate improved swallowing function, assisting to  determine potential to resume a po diet.       CHL IP TREATMENT RECOMMENDATION 12/01/2014  Treatment Recommendations Therapy as outlined in treatment plan below     CHL IP DIET RECOMMENDATION 12/01/2014  SLP Diet Recommendations Ice chips PRN after oral care;NPO  Liquid Administration via (None)  Medication Administration Via alternative means  Compensations (None)  Postural Changes and/or Swallow Maneuvers (None)     CHL IP OTHER RECOMMENDATIONS 12/01/2014  Recommended Consults Consider ENT evaluation;Consider GI evaluation  Oral Care Recommendations Oral care QID  Other Recommendations (None)        CHL IP FREQUENCY AND DURATION 12/01/2014  Speech Therapy Frequency (ACUTE ONLY) min 1 x/week  Treatment Duration 1 week            CHL IP REASON FOR REFERRAL 12/01/2014  Reason for Referral Objectively evaluate swallowing function               CHL IP GO 09/18/2013  Functional Assessment Tool Used skilled clinical judgment  Functional Limitations  Swallowing  Swallow Current Status (B6389) CJ  Swallow Goal Status (H7342) [place]  Swallow Discharge Status (A7681) CJ  Motor Speech Current Status (L5726) (None)  Motor Speech Goal Status (O0355) (None)  Motor Speech Goal Status (H7416) (None)  Spoken Language Comprehension Current Status (L8453) (None)  Spoken Language Comprehension Goal Status (M4680) (None)  Spoken Language Comprehension Discharge Status [phone]) (None)  Spoken Language Expression Current Status [phone]) (None)  Spoken Language Expression Goal Status [phone]) (None)  Spoken Language Expression Discharge Status [phone]) (None)  Attention Current Status (Q9450) (None)  Attention Goal Status (T8882) (None)  Attention Discharge Status [phone]) (None)  Memory Current Status (J1791) (None)  Memory Goal Status (T0569) (None)  Memory Discharge Status (V9480) (None)  Voice Current Status (X6553) (None)  Voice Goal Status (Z4827) (None)  Voice Discharge Status [phone]) (None)  Other Speech-Language Pathology Functional Limitation [phone]) (None)  Other Speech-Language Pathology Functional Limitation Goal Status (E1007)  (None)  Other Speech-Language Pathology Functional Limitation Discharge Status  [phone]) (None)    Gabriel Rainwater MA, CCC-SLP [phone]        McCoy Leah Meryl 12/01/2014, 2:37 PM     Medications / Allergies: per chart  Antibiotics: Anti-infectives    Start     Dose/Rate Route Frequency Ordered Stop   12/01/14 1500  cefTAZidime (FORTAZ) 1 g in dextrose 5 % 50 mL IVPB    Comments:  When taking PO well, switch to home azithromycin 262m PO daily   1 g 100 mL/hr over 30 Minutes Intravenous Every 12 hours 12/01/14 1332     11/28/14 2300  cefoTEtan (CEFOTAN) 2 g in dextrose 5 % 50 mL IVPB     2 g 100 mL/hr over 30 Minutes Intravenous Every 12 hours 11/28/14 1736 11/28/14 2330   11/28/14 1532  clindamycin (CLEOCIN) 900 mg, gentamicin (GARAMYCIN) 240 mg in sodium chloride 0.9 % 1,000 mL for intraperitoneal lavage  Status:  Discontinued        As needed 11/28/14 1532 11/28/14 1535   11/28/14 1006  cefoTEtan (CEFOTAN) 2 g in dextrose 5 % 50 mL IVPB     2 g 100 mL/hr over 30 Minutes Intravenous On call to O.R. 11/28/14 1006 11/28/14 1305   11/28/14 0600  clindamycin (CLEOCIN) 900 mg, gentamicin (GARAMYCIN) 240 mg in sodium chloride 0.9 % 1,000 mL for intraperitoneal lavage  Status:  Discontinued    Comments:  Pharmacy may adjust dosing strength, schedule, rate of infusion, etc as needed to optimize therapy    Intraperitoneal To Surgery 11/27/14 1323 11/28/14 1643        Note: Portions of this report may have been transcribed using voice recognition software. Every effort was made to ensure accuracy; however, inadvertent computerized transcription errors may be present.   Any transcriptional errors that result from this process are unintentional.     SAdin Hector M.D., F.A.C.S. Gastrointestinal and Minimally Invasive Surgery [facility] [address] [place] [phone] / Paging   12/02/2014  CARE TEAM:  PCP: PWillodean Rosenthal MD  Outpatient Care Team: Patient Care Team:  Willodean Rosenthal, MD as PCP - General (Internal Medicine) Bjorn Loser, MD as Consulting Physician (Urology) Michael Boston, MD as Consulting Physician (General Surgery) Teena Irani, MD as Consulting Physician (Gastroenterology)  Inpatient Treatment Team: Treatment Team: Attending Provider: Michael Boston, MD; Registered Nurse: Bailey Mech, RN; Consulting Physician: Ian Bushman, MD; Technician: Larene Beach, NT; Technician: Coralie Carpen, NT; Technician: Mertha Baars, NT; Physical Therapist: Diego Cory, PTA; Consulting Physician: Izora Gala, MD; Registered Nurse: Joellen Jersey, RN

## 2014-12-02 NOTE — Progress Notes (Signed)
[facility] PROGRESS NOTE  Julie Richardson ZOX:[id number] DOB: 12/15/1925 DOA: 11/28/2014 PCP: Darlina Guys, MD  Assessment/Plan: 1. Slurring/Horseness of speech -due to Xerostomia/pharyngeal irritation by ET tube, pain meds etc -MRI negative -resolved  2. Colovaginal fistula -s/p XI robot assisted SIGMOID COLECTOMY, REPAIR OF COLOVAGINAL FISTULA,RIGID PROCTOSCOPY. -per Dr.Gross  3. H/o chronic Dysphagia/Recurrent aspiration -dysphagia worsened in setting of being post op, pain meds etc -Failed MBS 10/10, significant upper pharyngeal dysphagia noted per Speech yesterday -Dr.Gross d/w ENT and Rads, plan for Esophogram today and depending on this may need direct visualization -? Worsened by Surgery/ET tube, hopeful of improvement with some time  -MRI no acute CVA  4. H/o P.Afib -no longer on anticoagulation, was taken off pradaxa by PCP >1year ago, no major bleeding except bruising -start ASA when takes PO -having SVT outbreaks-but less, since Sotalol on hold, continue IV lopressor 10mg  and PRN dose  5. H/o Bronchiectasis and prior pseudomonal infection per son -on long term azithromycin, on Home O2 -now on Fortaz since unable to take PO Azithro  6. 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 -on chronic prednisone, now on IV solumedrol, transition back to Prednisone at home dose when Po resumed  Code Status: DNR Family Communication: none at bedside, left msg for son Disposition Plan: per  Dr.Gross    HPI/Subjective: Feels ok, mild abd discomfort, breathing ok  Objective: Filed Vitals:   12/02/14 0447  BP: 112/86  Pulse: 76  Temp: 98.2 F (36.8 C)  Resp: 18    Intake/Output Summary (Last 24 hours) at 12/02/14 1053 Last data filed at 12/02/14 0900  Gross per 24 hour  Intake 1877.5 ml  Output   2200 ml  Net -322.5 ml   Filed Weights   11/30/14 0400 12/01/14 0500 12/02/14 0447  Weight: 42.865 kg (94 lb 8 oz) 44.18 kg (97 lb 6.4 oz) 44.135 kg (97 lb 4.8 oz)     Exam:   General: AAOx3, elderly, frail  Cardiovascular: S1S2/regular rate and rhythm  Respiratory: ronchi at R base-improved  Abdomen: soft, mildly distended, BS present, surgical scar  Musculoskeletal: no edema c/c   Data Reviewed: Basic Metabolic Panel:  Recent Labs Lab 11/28/14 1030 11/29/14 0505 12/01/14 0545 12/02/14 0427  NA 136 138 140 136  K 3.7 4.0 3.5 3.0*  CL 104 107 109 104  CO2 21* 23 18* 25  GLUCOSE 69 100* 52* 157*  BUN 33* 34* 20 11  CREATININE 1.18* 1.17* 0.78 0.73  CALCIUM 9.5 8.3* 8.4* 8.2*  MG  --  1.5*  --  1.5*   Liver Function Tests: No results for input(s): AST, ALT, ALKPHOS, BILITOT, PROT, ALBUMIN in the last 168 hours. No results for input(s): LIPASE, AMYLASE in the last 168 hours. No results for input(s): AMMONIA in the last 168 hours. CBC:  Recent Labs Lab 11/29/14 0505 12/01/14 0545 12/02/14 0427  WBC 9.4 10.4 7.4  HGB 8.8* 8.9* 9.6*  HCT 27.3* 28.5* 30.0*  MCV 92.5 96.3 92.3  PLT 229 236 235   Cardiac Enzymes: No results for input(s): CKTOTAL, CKMB, CKMBINDEX, TROPONINI in the last 168 hours. BNP (last 3 results) No results for input(s): BNP in the last 8760 hours.  ProBNP (last 3 results) No results for input(s): PROBNP in the last 8760 hours.  CBG:  Recent Labs Lab 11/28/14 1012  GLUCAP 67    No results found for this or any previous visit (from the past 240 hour(s)).   Studies: Dg Chest 2 View  12/02/2014   CLINICAL DATA:  Dysphasia.  Shortness of breath.  EXAM: CHEST  2 VIEW  COMPARISON:  None.  FINDINGS: Mediastinum and hilar structures are normal. Cardiomegaly with mild pulmonary vascular prominence and interstitial prominence with small pleural effusions. These findings suggest mild congestive heart failure. Bibasilar subsegmental atelectasis and/or scarring. No acute bony abnormality.  IMPRESSION: Cardiomegaly with mild pulmonary vascular prominence and interstitial prominence with small bilateral  pleural effusions consistent with mild congestive heart failure.   Electronically Signed   By: Maisie Fus  Register   On: 12/02/2014 09:56   Dg Chest 2 View  11/30/2014   CLINICAL DATA:  Chronic bronchiectasis and dyspnea.  EXAM: CHEST  2 VIEW  COMPARISON:  04/26/2014  FINDINGS: Cardiomediastinal silhouette is normal. Mediastinal contours appear intact. Aorta is torturous.  There is no evidence of pneumothorax. There is bibasilar atelectasis. The previously seen areas of bronchiectasis and peribronchial thickening have improved but not completely resolved radiographically. There may be small bilateral pleural effusions.  Osseous structures are without acute abnormality. There is compression deformity of 2 of the mid to lower thoracic vertebral bodies, stable from CT dated 03/29/2014. Soft tissues are grossly normal.  IMPRESSION: Persistent areas of bronchiectasis and peribronchial thickening, with lower lobe predominance.  Bibasilar atelectasis.  Possible small bilateral pleural effusions.   Electronically Signed   By: Ted Mcalpine M.D.   On: 11/30/2014 12:17   Ct Head Wo Contrast  11/30/2014   CLINICAL DATA:  Reason surgery with new onset of slurred speech postoperatively.  EXAM: CT HEAD WITHOUT CONTRAST  TECHNIQUE: Contiguous axial images were obtained from the base of the skull through the vertex without intravenous contrast.  COMPARISON:  MRI same day.  Head CT 04/26/2014  FINDINGS: No sign of acute infarction. There is mild generalized brain atrophy. There are mild chronic small-vessel ischemic changes within the white matter. There are old lacunar infarctions in the right basal ganglia and thalamus. Physiologic calcification is present in the basal ganglia bilaterally. The patient has benign dural calcification. No mass lesion, hemorrhage, hydrocephalus or extra-axial collection. The calvarium is unremarkable. There is extensive opacification of the maxillary sinuses consistent with sinusitis. There  is atherosclerotic calcification of the major vessels at the base of the brain.  IMPRESSION: Atrophy and chronic small vessel disease. No acute intracranial finding.  Maxillary sinusitis.   Electronically Signed   By: Paulina Fusi M.D.   On: 11/30/2014 13:55   Mr Brain Wo Contrast  11/30/2014   CLINICAL DATA:  New onset slurred speech.  Recent colectomy.  EXAM: MRI HEAD WITHOUT CONTRAST  TECHNIQUE: Multiplanar, multiecho pulse sequences of the brain and surrounding structures were obtained without intravenous contrast.  COMPARISON:  Head CT 04/26/2014  FINDINGS: There is no evidence of acute infarct, intracranial hemorrhage, mass, midline shift, or extra-axial fluid collection. There is moderate generalized cerebral atrophy. T2 hyperintensities in the subcortical and deep cerebral white matter and pons are nonspecific but compatible with mild chronic small vessel ischemic disease. A chronic right basal ganglia lacunar infarct is again noted.  Prior right cataract extraction is noted. Chronic bilateral maxillary sinusitis is again noted with likely proteinaceous/inspissated material filling both sinuses. There is also mild left ethmoid air cell mucosal thickening. A trace right mastoid effusion is noted. Major intracranial vascular flow voids are preserved.  IMPRESSION: 1. No acute intracranial abnormality. 2. Mild chronic small vessel ischemic disease. Chronic right basal ganglia lacunar infarct.   Electronically Signed   By: Jolaine Click.D.  On: 11/30/2014 13:31   Dg Swallowing Func-speech Pathology  12/01/2014    Objective Swallowing Evaluation:   MBS Patient Details  Name: Julie Richardson MRN: [id number] Date of Birth: Aug 24, 1925  Today's Date: 12/01/2014 Time: SLP Start Time (ACUTE ONLY): 1348-SLP Stop Time (ACUTE ONLY): 1406 SLP Time Calculation (min) (ACUTE ONLY): 18 min  Past Medical History:  Past Medical History  Diagnosis Date  . HOH (hard of hearing)   . Anemia   . Diabetes mellitus without  complication (HCC)   . History of home oxygen therapy     uses 2 liters at hs and prn during day  . Atrial fibrillation (HCC)     hx of  . Spinal stenosis   . GERD (gastroesophageal reflux disease)   . Chronic kidney disease     stage 3  . Arthritis   . Dysphagia     uses thickened liquids  . Hyperlipidemia   . Pelvis fracture (HCC) 6 years ago  . Complication of anesthesia     slow to wake up  . Blind left eye   . HOH (hard of hearing)   . TIA (transient ischemic attack)     7 YEARS AGO, NONE SINCE  . Pneumonia oct 2015    ASPIRATION PNEUMONIA X 3- 4 TIMES IN PAST  . COPD (chronic obstructive pulmonary disease) (HCC)     pseodonomonus bronchiectasis   Past Surgical History:  Past Surgical History  Procedure Laterality Date  . Abdominal hysterectomy    . Right shoulder surgery  years ago    for fracture  . Tonsillectomy  as child   HPI:  Other Pertinent Information: Pt is s/p XI robot assisted SIGMOID  COLECTOMY, REPAIR OF COLOVAGINAL FISTULA,RIGID PROCTOSCOPY.   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 (overnight in 08/2013).  Pt reports  using nectar thick liquids over the last year or so.  Swallow eval ordered  as pt reports difficulty swallowing medicine - even when crushed.    No Data Recorded  Assessment / Plan / Recommendation CHL IP CLINICAL IMPRESSIONS 12/01/2014  Therapy Diagnosis Suspected primary esophageal dysphagia;Mild pharyngeal  phase dysphagia  Clinical Impression Patient presents with a suspected primary esophageal  dysphagia with mild pharyngeal component. Oral phase WFL. Timing of  swallow adequate. Anterior hyo-laryngeal movement appearing mildly reduced  by not significant enough to account for severely impaired opening of the  UES. Boluses of all consistencies trialed sit in pyriform sinuses post  swallow with little-no clearance of bolus into the esophagus. SLP  instructed patient for a variety of head postures  (head turns, tilts, and  chin tucks) without change in function. Patient with eventual penetration  and aspiration of consistencies provided with intact sensation and  clearance of airway but inability to clear via esophagus requiring  expectoration of bolus. Swallow is at this time non-functional given  little-no clearance of bolus into esophagus. Per patient and MBS note from  2015, dysphagia only mild and functional. Patient was consuming po prior  to admission. Of note, discussion with Dr. Jomarie Longs revealed that patient  has had multiple recent admissions to [facility] for  aspiration PNA. At this time, while brief intubation and deconditioning  may account for some decline in dysfunction, question if primary  esophageal deficit is to blame for current level of dysfunction. Unclear  baseline further complicates diagnostics. ENT or GI consult may be  beneficial  in ruling out primary UES dysfunction and determining potential  for intervention to improve. SLP will continue to f/u at bedside to  monitor for a facilitate improved swallowing function, assisting to  determine potential to resume a po diet.       CHL IP TREATMENT RECOMMENDATION 12/01/2014  Treatment Recommendations Therapy as outlined in treatment plan below     CHL IP DIET RECOMMENDATION 12/01/2014  SLP Diet Recommendations Ice chips PRN after oral care;NPO  Liquid Administration via (None)  Medication Administration Via alternative means  Compensations (None)  Postural Changes and/or Swallow Maneuvers (None)     CHL IP OTHER RECOMMENDATIONS 12/01/2014  Recommended Consults Consider ENT evaluation;Consider GI evaluation  Oral Care Recommendations Oral care QID  Other Recommendations (None)        CHL IP FREQUENCY AND DURATION 12/01/2014  Speech Therapy Frequency (ACUTE ONLY) min 1 x/week  Treatment Duration 1 week            CHL IP REASON FOR REFERRAL 12/01/2014  Reason for Referral Objectively evaluate swallowing function                CHL IP GO 09/18/2013  Functional Assessment Tool Used skilled clinical judgment  Functional Limitations Swallowing  Swallow Current Status (Z6109) CJ  Swallow Goal Status (U0454) CJ  Swallow Discharge Status (U9811) CJ  Motor Speech Current Status (B1478) (None)  Motor Speech Goal Status (G9562) (None)  Motor Speech Goal Status (Z3086) (None)  Spoken Language Comprehension Current Status (V7846) (None)  Spoken Language Comprehension Goal Status (N6295) (None)  Spoken Language Comprehension Discharge Status (M8413) (None)  Spoken Language Expression Current Status (K4401) (None)  Spoken Language Expression Goal Status (U2725) (None)  Spoken Language Expression Discharge Status (D6644) (None)  Attention Current Status (I3474) (None)  Attention Goal Status (Q5956) (None)  Attention Discharge Status (L8756) (None)  Memory Current Status (E3329) (None)  Memory Goal Status (J1884) (None)  Memory Discharge Status (Z6606) (None)  Voice Current Status (T0160) (None)  Voice Goal Status (F0932) (None)  Voice Discharge Status (T5573) (None)  Other Speech-Language Pathology Functional Limitation (U2025) (None)  Other Speech-Language Pathology Functional Limitation Goal Status (K2706)  (None)  Other Speech-Language Pathology Functional Limitation Discharge Status  [phone]) (None)    Leah McCoy MA, CCC-SLP [phone]        McCoy Leah Meryl 12/01/2014, 2:37 PM     Scheduled Meds: . antiseptic oral rinse  15 mL Mouth Rinse TID  . cefTAZidime (FORTAZ)  IV  1 g Intravenous Q12H  . cycloSPORINE  1 drop Both Eyes BID  . enoxaparin (LOVENOX) injection  30 mg Subcutaneous Q24H  . erythromycin  1 application Both Eyes QHS  . hydrocortisone sod succinate (SOLU-CORTEF) inj  20 mg Intravenous Daily  . ipratropium-albuterol  3 mL Nebulization BID  . lip balm  1 application Topical BID  . metoprolol  10 mg Intravenous 4 times per day  . pantoprazole (PROTONIX) IV  40 mg Intravenous QHS  . potassium chloride  10 mEq Intravenous  Q1 Hr x 4  . RESOURCE THICKENUP CLEAR   Oral TID WC  . saccharomyces boulardii  250 mg Oral BID  . sodium chloride  3 mL Intravenous Q12H   Continuous Infusions: . dextrose 5 % and 0.9% NaCl 75 mL/hr at 12/01/14 2151   Antibiotics Given (last 72 hours)    Date/Time Action Medication Dose Rate   12/01/14 1545 Given   cefTAZidime (FORTAZ) 1 g in dextrose 5 % 50 mL IVPB 1  g 100 mL/hr   12/02/14 0322 Given   cefTAZidime (FORTAZ) 1 g in dextrose 5 % 50 mL IVPB 1 g 100 mL/hr      Principal Problem:   Colovaginal fistula s/p robotic colectomy/repair 11/28/2014 Active Problems:   Slurring of speech   HOH (hard of hearing)   COPD (chronic obstructive pulmonary disease) (HCC)   TIA (transient ischemic attack)   GERD (gastroesophageal reflux disease)   Bronchiectasis with Pseudomonas without acute exacerbation (HCC)   Chronic antibiotic suppression - Azithromycin for chronic bronchiectasis with Pseudomonas   Hypokalemia   Hypomagnesemia    Time spent:    [facility]  [facility] Pager [phone]. If 7PM-7AM, please contact night-coverage at [email or web address], password [facility] 12/02/2014, 10:53 AM  LOS: 4 days

## 2014-12-02 NOTE — Clinical Social Work Note (Cosign Needed)
Clinical Social Work Assessment  Patient Details  Name: Julie Richardson MRN: [id number] Date of Birth: 28-Aug-1925  Date of referral:  12/02/14               Reason for consult:  Discharge Planning                Permission sought to share information with:  Chartered certified accountant granted to share information::  Yes, Verbal Permission Granted  Name::        Agency::  [place] [facility]  Relationship::     Contact Information:     Housing/Transportation Living arrangements for the past 2 months:  [place] of Information:  Patient Patient Interpreter Needed:  None Criminal Activity/Legal Involvement Pertinent to Current Situation/Hospitalization:  No - Comment as needed Significant Relationships:  Adult Children Lives with:  Facility Resident Do you feel safe going back to the place where you live?  Yes Need for family participation in patient care:  No (Coment)  Care giving concerns:  Pt admitted from [facility]. Pt pleased with [facility] care.   Social Worker assessment / plan:  Pt admitted from [facility].  CSW and BSW Intern met with pt at bedside. Introduced ourselves and explained role. Pt confirms she is a resident at [facility]. Pt reports she has been at facility for 4 months.   CSW provided support as pt discussed plans for PEG placement. CSW discussed with pt that pt may need higher level of care at [facility] due to PEG. Pt understanding and agreeable to SNF placement when medically ready.  CSW notified [place] and facility asked to be kept updated. CSW to send clinical information.  CSW to continue to follow.  Employment status:  Retired Forensic scientist:  [employer] PT Recommendations:  [place] / Referral to community resources:  [place]  Patient/Family's Response to care: Pt alert and oriented x4. Pt  expressed understanding that PEG placement may require higher level of care.   Patient/Family's Understanding of and Emotional Response to Diagnosis, Current Treatment, and Prognosis:  Pt aware of plan for PEG placement and awaiting further instruction from medical team.  Emotional Assessment Appearance:  Appears stated age Attitude/Demeanor/Rapport:   (Appropriate) Affect (typically observed):  Appropriate Orientation:  Oriented to Self, Oriented to Place, Oriented to  Time, Oriented to Situation Alcohol / Substance use:  Not Applicable Psych involvement (Current and /or in the community):  No (Comment)  Discharge Needs  Concerns to be addressed:  Discharge Planning Concerns Readmission within the last 30 days:  No Current discharge risk:  None Barriers to Discharge:  Continued Medical Work up   [employer], Student-SW 12/02/2014, 3:35 PM

## 2014-12-03 DIAGNOSIS — E440 Moderate protein-calorie malnutrition: Secondary | ICD-10-CM

## 2014-12-03 DIAGNOSIS — N823 Fistula of vagina to large intestine: Secondary | ICD-10-CM | POA: Diagnosis not present

## 2014-12-03 DIAGNOSIS — K219 Gastro-esophageal reflux disease without esophagitis: Secondary | ICD-10-CM

## 2014-12-03 DIAGNOSIS — Z792 Long term (current) use of antibiotics: Secondary | ICD-10-CM

## 2014-12-03 LAB — BASIC METABOLIC PANEL
Anion gap: 8 (ref 5–15)
BUN: 8 mg/dL (ref 6–20)
CHLORIDE: 105 mmol/L (ref 101–111)
CO2: 26 mmol/L (ref 22–32)
CREATININE: 0.6 mg/dL (ref 0.44–1.00)
Calcium: 8.1 mg/dL — ABNORMAL LOW (ref 8.9–10.3)
GFR calc Af Amer: >60 mL/min (ref >60)
GLUCOSE: 137 mg/dL — AB (ref 65–99)
POTASSIUM: 3 mmol/L — AB (ref 3.5–5.1)
Sodium: 139 mmol/L (ref 135–145)

## 2014-12-03 LAB — HEPATIC FUNCTION PANEL
ALT: 17 U/L (ref 14–54)
AST: 33 U/L (ref 15–41)
Albumin: 2.7 g/dL — ABNORMAL LOW (ref 3.5–5.0)
Alkaline Phosphatase: 48 U/L (ref 38–126)
BILIRUBIN DIRECT: 0.2 mg/dL (ref 0.1–0.5)
BILIRUBIN INDIRECT: 0.7 mg/dL (ref 0.3–0.9)
Total Bilirubin: 0.9 mg/dL (ref 0.3–1.2)
Total Protein: 6.2 g/dL — ABNORMAL LOW (ref 6.5–8.1)

## 2014-12-03 LAB — CBC
HCT: 27.3 % — ABNORMAL LOW (ref 36.0–46.0)
Hemoglobin: 8.8 g/dL — ABNORMAL LOW (ref 12.0–15.0)
MCH: 29.5 pg (ref 26.0–34.0)
MCHC: 32.2 g/dL (ref 30.0–36.0)
MCV: 91.6 fL (ref 78.0–100.0)
PLATELETS: 225 10*3/uL (ref 150–400)
RBC: 2.98 MIL/uL — AB (ref 3.87–5.11)
RDW: 14.7 % (ref 11.5–15.5)
WBC: 7 10*3/uL (ref 4.0–10.5)

## 2014-12-03 LAB — LIPASE, BLOOD: LIPASE: 33 U/L (ref 22–51)

## 2014-12-03 LAB — PREALBUMIN: Prealbumin: 11.6 mg/dL — ABNORMAL LOW (ref 18–38)

## 2014-12-03 MED ORDER — POTASSIUM CHLORIDE 10 MEQ/100ML IV SOLN
10.0000 meq | INTRAVENOUS | Status: AC
  Administered 2014-12-03 (×4): 10 meq via INTRAVENOUS
  Filled 2014-12-03 (×2): qty 100

## 2014-12-03 MED ORDER — MAGNESIUM SULFATE 2 GM/50ML IV SOLN
2.0000 g | Freq: Once | INTRAVENOUS | Status: AC
  Administered 2014-12-03: 2 g via INTRAVENOUS
  Filled 2014-12-03: qty 50

## 2014-12-03 MED ORDER — KCL IN DEXTROSE-NACL 40-5-0.45 MEQ/L-%-% IV SOLN
INTRAVENOUS | Status: DC
  Administered 2014-12-03 – 2014-12-04 (×3): via INTRAVENOUS
  Filled 2014-12-03 (×5): qty 1000

## 2014-12-03 NOTE — Progress Notes (Signed)
Quick Note:  Path c/w diverticulitis. Copy w pt & her son in the room. ______

## 2014-12-03 NOTE — Consult Note (Signed)
Reason for Consult:Dysphagia Referring Physician: Michael Boston, MD  Julie Richardson is an 81 y.o. female.  HPI: History of trouble swallowing, aspiration and multiple episodes of pneumonia over the past 1-3 years (patient is not sure of the length of time). She had been on a soft diet prior to the colorectal surgery last week, but since the surgery is unable to swallow anything.   Past Medical History  Diagnosis Date  . HOH (hard of hearing)   . Anemia   . Diabetes mellitus without complication ([place])   . History of home oxygen therapy     uses 2 liters at hs and prn during day  . Atrial fibrillation (HCC)     hx of  . Spinal stenosis   . GERD (gastroesophageal reflux disease)   . Chronic kidney disease     stage 3  . Arthritis   . Dysphagia     uses thickened liquids  . Hyperlipidemia   . Pelvis fracture ([place]) 6 years ago  . Complication of anesthesia     slow to wake up  . Blind left eye   . HOH (hard of hearing)   . TIA (transient ischemic attack)     7 YEARS AGO, NONE SINCE  . Pneumonia oct 2015    ASPIRATION PNEUMONIA X 3- 4 TIMES IN PAST  . COPD (chronic obstructive pulmonary disease) (HCC)     pseodonomonus bronchiectasis    Past Surgical History  Procedure Laterality Date  . Abdominal hysterectomy    . Right shoulder surgery  years ago    for fracture  . Tonsillectomy  as child    History reviewed. No pertinent family history.  Social History:  reports that she has never smoked. She has never used smokeless tobacco. She reports that she does not drink alcohol or use illicit drugs.  Allergies:  Allergies  Allergen Reactions  . Coumadin [Warfarin Sodium] Other (See Comments)    Rectal bleeding   . Sulfa Antibiotics Rash    Medications: Reviewed  Results for orders placed or performed during the hospital encounter of 11/28/14 (from the past 48 hour(s))  CBC     Status: Abnormal   Collection Time: 12/02/14  4:27 AM  Result Value Ref Range   WBC 7.4 4.0  - 10.5 K/uL   RBC 3.25 (L) 3.87 - 5.11 MIL/uL   Hemoglobin 9.6 (L) 12.0 - 15.0 g/dL   HCT 30.0 (L) 36.0 - 46.0 %   MCV 92.3 78.0 - 100.0 fL   MCH 29.5 26.0 - 34.0 pg   MCHC 32.0 30.0 - 36.0 g/dL   RDW 14.3 11.5 - 15.5 %   Platelets 235 150 - 400 K/uL  Basic metabolic panel     Status: Abnormal   Collection Time: 12/02/14  4:27 AM  Result Value Ref Range   Sodium 136 135 - 145 mmol/L   Potassium 3.0 (L) 3.5 - 5.1 mmol/L   Chloride 104 101 - 111 mmol/L   CO2 25 22 - 32 mmol/L   Glucose, Bld 157 (H) 65 - 99 mg/dL   BUN 11 6 - 20 mg/dL   Creatinine, Ser 0.73 0.44 - 1.00 mg/dL   Calcium 8.2 (L) 8.9 - 10.3 mg/dL   GFR calc non Af Amer >60 >60 mL/min   GFR calc Af Amer >60 >60 mL/min    Comment: (NOTE) The eGFR has been calculated using the CKD EPI equation. This calculation has not been validated in all clinical situations. eGFR's persistently <60  mL/min signify possible Chronic Kidney Disease.    Anion gap 7 5 - 15  Magnesium     Status: Abnormal   Collection Time: 12/02/14  4:27 AM  Result Value Ref Range   Magnesium 1.5 (L) 1.7 - 2.4 mg/dL  CBC     Status: Abnormal   Collection Time: 12/03/14  5:01 AM  Result Value Ref Range   WBC 7.0 4.0 - 10.5 K/uL   RBC 2.98 (L) 3.87 - 5.11 MIL/uL   Hemoglobin 8.8 (L) 12.0 - 15.0 g/dL   HCT 27.3 (L) 36.0 - 46.0 %   MCV 91.6 78.0 - 100.0 fL   MCH 29.5 26.0 - 34.0 pg   MCHC 32.2 30.0 - 36.0 g/dL   RDW 14.7 11.5 - 15.5 %   Platelets 225 150 - 400 K/uL  Basic metabolic panel     Status: Abnormal   Collection Time: 12/03/14  5:01 AM  Result Value Ref Range   Sodium 139 135 - 145 mmol/L   Potassium 3.0 (L) 3.5 - 5.1 mmol/L   Chloride 105 101 - 111 mmol/L   CO2 26 22 - 32 mmol/L   Glucose, Bld 137 (H) 65 - 99 mg/dL   BUN 8 6 - 20 mg/dL   Creatinine, Ser 0.60 0.44 - 1.00 mg/dL   Calcium 8.1 (L) 8.9 - 10.3 mg/dL   GFR calc non Af Amer >60 >60 mL/min   GFR calc Af Amer >60 >60 mL/min    Comment: (NOTE) The eGFR has been calculated  using the CKD EPI equation. This calculation has not been validated in all clinical situations. eGFR's persistently <60 mL/min signify possible Chronic Kidney Disease.    Anion gap 8 5 - 15    Dg Chest 2 View  12/02/2014  CLINICAL DATA:  Dysphasia.  Shortness of breath. EXAM: CHEST  2 VIEW COMPARISON:  None. FINDINGS: Mediastinum and hilar structures are normal. Cardiomegaly with mild pulmonary vascular prominence and interstitial prominence with small pleural effusions. These findings suggest mild congestive heart failure. Bibasilar subsegmental atelectasis and/or scarring. No acute bony abnormality. IMPRESSION: Cardiomegaly with mild pulmonary vascular prominence and interstitial prominence with small bilateral pleural effusions consistent with mild congestive heart failure. Electronically Signed   By: Marcello Moores  Register   On: 12/02/2014 09:56   Dg Esophagus  12/02/2014  CLINICAL DATA:  Dysphagia. Aspiration on swallowing function study yesterday. History of aspiration pneumonia. EXAM: ESOPHOGRAM/BARIUM SWALLOW TECHNIQUE: Single contrast examination was performed using thin and thick barium. FLUOROSCOPY TIME:  Radiation Exposure Index (as provided by the fluoroscopic device): If the device does not provide the exposure index: Fluoroscopy Time:  2 minutes 5 seconds Number of Acquired Images: COMPARISON:  None. FINDINGS: Abnormal pharyngeal phase of swallowing. There was pooling of thick and thin barium in the valleculae and piriform sinuses. The patient had difficulty coordinating swallowing. Patient had difficulty moving the bolus into the esophagus. Initial swallows with thin barium revealed penetration without aspiration. Additional thick barium swallows progressed to frank aspiration of a mild amount. The esophageal motility is diminished. No stricture or mass. Limited evaluation of the esophagus due to limited barium in the esophagus. IMPRESSION: Poor esophageal motility with difficulty propelling  the bolus into the esophagus. There is pooling in the pharynx. There was aspiration of thick barium. There was penetration with thin barium. Limited evaluation the esophagus due to small boluses. There is esophageal dysmotility. No stricture or obstruction of the esophagus. Electronically Signed   By: Franchot Gallo M.D.  On: 12/02/2014 11:40   Dg Swallowing Func-speech Pathology  12/01/2014  Objective Swallowing Evaluation:   MBS Patient Details Name: Jamilia Jacques MRN: [id number] Date of Birth: 1925/11/26 Today's Date: 12/01/2014 Time: SLP Start Time (ACUTE ONLY): 1348-SLP Stop Time (ACUTE ONLY): 1406 SLP Time Calculation (min) (ACUTE ONLY): 18 min Past Medical History: Past Medical History Diagnosis Date . HOH (hard of hearing)  . Anemia  . Diabetes mellitus without complication ([place])  . History of home oxygen therapy    uses 2 liters at hs and prn during day . Atrial fibrillation (HCC)    hx of . Spinal stenosis  . GERD (gastroesophageal reflux disease)  . Chronic kidney disease    stage 3 . Arthritis  . Dysphagia    uses thickened liquids . Hyperlipidemia  . Pelvis fracture ([place]) 6 years ago . Complication of anesthesia    slow to wake up . Blind left eye  . HOH (hard of hearing)  . TIA (transient ischemic attack)    7 YEARS AGO, NONE SINCE . Pneumonia oct 2015   ASPIRATION PNEUMONIA X 3- 4 TIMES IN PAST . COPD (chronic obstructive pulmonary disease) (HCC)    pseodonomonus bronchiectasis Past Surgical History: Past Surgical History Procedure Laterality Date . Abdominal hysterectomy   . Right shoulder surgery  years ago   for fracture . Tonsillectomy  as child HPI: Other Pertinent Information: Pt is s/p XI robot assisted SIGMOID COLECTOMY, REPAIR OF COLOVAGINAL FISTULA,RIGID PROCTOSCOPY.   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 (overnight in 08/2013).  Pt reports using nectar thick liquids over the last year or so.  Swallow eval  ordered as pt reports difficulty swallowing medicine - even when crushed.   No Data Recorded Assessment / Plan / Recommendation CHL IP CLINICAL IMPRESSIONS 12/01/2014 Therapy Diagnosis Suspected primary esophageal dysphagia;Mild pharyngeal phase dysphagia Clinical Impression Patient presents with a suspected primary esophageal dysphagia with mild pharyngeal component. Oral phase WFL. Timing of swallow adequate. Anterior hyo-laryngeal movement appearing mildly reduced by not significant enough to account for severely impaired opening of the UES. Boluses of all consistencies trialed sit in pyriform sinuses post swallow with little-no clearance of bolus into the esophagus. SLP instructed patient for a variety of head postures (head turns, tilts, and chin tucks) without change in function. Patient with eventual penetration and aspiration of consistencies provided with intact sensation and clearance of airway but inability to clear via esophagus requiring expectoration of bolus. Swallow is at this time non-functional given little-no clearance of bolus into esophagus. Per patient and MBS note from 2015, dysphagia only mild and functional. Patient was consuming po prior to admission. Of note, discussion with Dr. Broadus John revealed that patient has had multiple recent admissions to [facility] for aspiration PNA. At this time, while brief intubation and deconditioning may account for some decline in dysfunction, question if primary esophageal deficit is to blame for current level of dysfunction. Unclear baseline further complicates diagnostics. ENT or GI consult may be beneficial in ruling out primary UES dysfunction and determining potential for intervention to improve. SLP will continue to f/u at bedside to monitor for a facilitate improved swallowing function, assisting to determine potential to resume a po diet.    CHL IP TREATMENT RECOMMENDATION 12/01/2014 Treatment Recommendations Therapy as outlined in  treatment plan below   CHL IP DIET RECOMMENDATION 12/01/2014 SLP Diet Recommendations Ice chips PRN after oral care;NPO Liquid Administration via (  None) Medication Administration Via alternative means Compensations (None) Postural Changes and/or Swallow Maneuvers (None)   CHL IP OTHER RECOMMENDATIONS 12/01/2014 Recommended Consults Consider ENT evaluation;Consider GI evaluation Oral Care Recommendations Oral care QID Other Recommendations (None)     CHL IP FREQUENCY AND DURATION 12/01/2014 Speech Therapy Frequency (ACUTE ONLY) min 1 x/week Treatment Duration 1 week       CHL IP REASON FOR REFERRAL 12/01/2014 Reason for Referral Objectively evaluate swallowing function       CHL IP GO 09/18/2013 Functional Assessment Tool Used skilled clinical judgment Functional Limitations Swallowing Swallow Current Status (Z6109) CJ Swallow Goal Status (U0454) [place] Swallow Discharge Status (U9811) CJ Motor Speech Current Status (B1478) (None) Motor Speech Goal Status (G9562) (None) Motor Speech Goal Status (Z3086) (None) Spoken Language Comprehension Current Status (V7846) (None) Spoken Language Comprehension Goal Status (N6295) (None) Spoken Language Comprehension Discharge Status [phone]) (None) Spoken Language Expression Current Status (K4401) (None) Spoken Language Expression Goal Status (U2725) (None) Spoken Language Expression Discharge Status [phone]) (None) Attention Current Status (I3474) (None) Attention Goal Status (Q5956) (None) Attention Discharge Status (L8756) (None) Memory Current Status (E3329) (None) Memory Goal Status (J1884) (None) Memory Discharge Status (Z6606) (None) Voice Current Status (T0160) (None) Voice Goal Status (F0932) (None) Voice Discharge Status (T5573) (None) Other Speech-Language Pathology Functional Limitation (U2025) (None) Other Speech-Language Pathology Functional Limitation Goal Status (K2706) (None) Other Speech-Language Pathology Functional Limitation Discharge Status [phone]) (None) Gabriel Rainwater MA, CCC-SLP [phone]   McCoy Leah Meryl 12/01/2014, 2:37 PM    VPX:TGGYIRSW except as listed in admit H&P  Blood pressure 147/94, pulse 68, temperature 97.6 F (36.4 C), temperature source Oral, resp. rate 17, height 4' 11" (1.499 m), weight 39.4 kg (86 lb 13.8 oz), SpO2 98 %.  PHYSICAL EXAM: Overall appearance:  Healthy appearing, in no distress Head:  Normocephalic, atraumatic. Ears: External ears appear healthy. Nose: External nose is healthy in appearance. Internal nasal exam free of any lesions or obstruction. Oral Cavity:  There are no mucosal lesions or masses identified. Mucous membranes are very dry. Tongue and palate are mobile. There is NO gag reflex.  Oral Pharynx/Hypopharynx/Larynx: no signs of any mucosal lesions or masses identified.  Larynx/Hypopharynx: Cords move well. There is pooling of secretions in the pyriform sinuses. Neuro:  No other identifiable neurologic deficits. Neck: No palpable neck masses.  Studies Reviewed: Barium swallow and MBS from this admission  nd one from last year.  Procedures: none   Assessment/Plan: Chronic dysphagia with aspiration. There is no anatomic defect or stricture. There is a significant sensori-motor defect in initiating swallow and detecting penetration/aspiration. There is no detectable gag reflex. The problem has been present for at least a year and is getting worse. She has already had aspiration pneumonia several times. She is at extremely high risk of more aspiration pneumonia and should remain NPO and would benefit with a Gastrostomy tube. Swallowing therapy might provide some benefit.   Duran Ohern 12/03/2014, 8:29 AM

## 2014-12-03 NOTE — Progress Notes (Signed)
Physical Therapy Treatment Patient Details Name: Julie OkaBettie Rendall MRN: [id number] DOB: 07/19/1925 Today's Date: 12/03/2014    History of Present Illness XI ROBOT ASSISTED SIGMOID COLECTOMY, REPAIR OF COLOVAGINAL FISTULA,RIGID PROCTOSCOPY on 11/28/14. 11/30/14: Pt with new onset of dysarthric speech since yesterday. Pt for MRI today.    PT Comments    Assisted pt OOB to amb in hallway a great distance.  Pt progressing well with her mobility.   Follow Up Recommendations  Home health PT;[facility]     Equipment Recommendations  None recommended by PT    Recommendations for Other Services       Precautions / Restrictions Precautions Precautions: Fall Restrictions Weight Bearing Restrictions: No    Mobility  Bed Mobility Overal bed mobility: Modified Independent             General bed mobility comments: increased time  Transfers Overall transfer level: Needs assistance Equipment used: Rolling walker (2 wheeled) Transfers: Sit to/from Stand Sit to Stand: Supervision         General transfer comment: good safety cognition and use of hands to steady self  Ambulation/Gait Ambulation/Gait assistance: Supervision Ambulation Distance (Feet): 350 Feet Assistive device: Rolling walker (2 wheeled) Gait Pattern/deviations: Step-through pattern     General Gait Details: good alternating gait.  No LOB.     Stairs            Wheelchair Mobility    Modified Rankin (Stroke Patients Only)       Balance                                    Cognition Arousal/Alertness: Awake/alert Behavior During Therapy: WFL for tasks assessed/performed Overall Cognitive Status: Within Functional Limits for tasks assessed                      Exercises      General Comments        Pertinent Vitals/Pain Pain Assessment: No/denies pain    Home Living                      Prior Function            PT  Goals (current goals can now be found in the care plan section) Progress towards PT goals: Progressing toward goals    Frequency  Min 3X/week    PT Plan      Co-evaluation             End of Session Equipment Utilized During Treatment: Gait belt Activity Tolerance: Patient tolerated treatment well Patient left: in chair;with call bell/phone within reach     Time: 1015-1040 PT Time Calculation (min) (ACUTE ONLY): 25 min  Charges:  $Gait Training: 8-22 mins $Therapeutic Activity: 8-22 mins                    G Codes:      Felecia ShellingLori Doranne Schmutz  PTA [facility]  Acute  Rehab Pager      [phone]

## 2014-12-03 NOTE — Progress Notes (Addendum)
Patient ID: Julie Richardson, female   DOB: 01/30/1926, 81 y.o.   MRN: [id number]  [facility] PROGRESS NOTE  Julie Richardson WJX:[id number] DOB: 10/28/1925 DOA: 11/28/2014 PCP: Darlina GuysPOWELL, JERRY, MD   Brief narrative:    81 yo female with chronic dysphagia, stroke 7 yrs ago, RA on chronic prednisone, remote paroxysmal a-fib but no longer on AC, colo-vaginal fistula, who was admitted 10/07 under surgery team and underwent sigmoid colectomy on 10/7. [facility] was consulted for evaluation of slurred speech.   For Dr. Michaell CowingGross, please note that due to complexity of chronic medical conditions, this pt is appropriate for IM service as the attending team. We would be willing to take over as the attending team if you agree and surgery team can remain as consulting team as Dr. Gordy SaversGross's assistance is greatly appreciated.   Assessment/Plan:    Principal Problem:   Colovaginal fistula s/p robotic colectomy/repair 11/28/2014 - post op day #5, clinically stable - management per surgery team  - path report unremarkable   Active Problems:   Chronic dysphagia with aspiration - ENT seen in consultation, pt at high risk for aspiration as there is a significant sensorimotor defect in initiating swallow - pt is also subsequently at high risk of aspiration PNA and she already has history of recurrent episodes - pt to remain NPO  - I consulted GI team for consideration of gastrostomy tube, may need EGD as well     Sinus tachycardia  - rate controlled on metoprolol IV for now which is given 4 x day - no chest pain or shortness of breath      COPD (chronic obstructive pulmonary disease) (HCC) - respiratory status is stable this AM    GERD (gastroesophageal reflux disease) - continue with Protonix IV    Bronchiectasis with Pseudomonas without acute exacerbation (HCC) - continue IV Ceftazidime as pt is now NPO    Hypokalemia - continue to supplement and repeat BMP in AM    Hypomagnesemia - continue to supplement  and repeat Mg level in AM - will give 2 gm IV x 1 dose today     Protein-calorie malnutrition, moderate (HCC), underweight  - must keep NPO for now - GI team consulted for assistance - Body mass index is 17.53     RA on chronic Prednisone - currently covered with Solumedrol IV until feeding issue resolved   DVT prophylaxis - SCD's  Code Status: DNR Family Communication:  plan of care discussed with the patient Disposition Plan: To be determined, still need GI team evaluation   IV access:  Peripheral IV  Procedures and diagnostic studies:    CXR 12/02/2014  Cardiomegaly with mild pulmonary vascular prominence, c/w mild congestive heart failure.  CXR 11/30/2014 Persistent areas of bronchiectasis and peribronchial thickening, with lower lobe predominance.  Ct Head Wo Contrast  11/30/2014 Atrophy and chronic small vessel disease. No acute intracranial finding.  MRI Brain 11/30/2014  No acute abnormality,chronic small vessel ischemia, chronic R basal ganglia lacunar infarct.   Dg Esophagus 12/02/2014 Poor esophageal motility with difficulty propelling the bolus into the esophagus. There is pooling in the pharynx. There was aspiration of thick barium. There was penetration with thin barium. Limited evaluation the esophagus due to small boluses. There is esophageal dysmotility. No stricture or obstruction of the esophagus.   Medical Consultants:  GI ENT  Other Consultants:  PT - home health PT  IAnti-Infectives:   None  Debbora PrestoMAGICK-Whitten Andreoni, MD  [facility] Pager [phone]  If 7PM-7AM, please contact night-coverage  [email or web address] Password [email or web address] 12/03/2014, 8:21 PM   LOS: 5 days   HPI/Subjective: No events overnight.   Objective: Filed Vitals:   12/02/14 2100 12/03/14 0428 12/03/14 1441 12/03/14 2000  BP: 103/79 147/94 139/82   Pulse: 77 68 83   Temp: 98.2 F (36.8 C) 97.6 F (36.4 C) 98.3 F (36.8 C)   TempSrc: Oral Oral Oral   Resp: Height:      Weight:  39.4  kg (86 lb 13.8 oz)    SpO2: 98% 98% 97% 94%    Intake/Output Summary (Last 24 hours) at 12/03/14 2021 Last data filed at 12/03/14 1821  Gross per 24 hour  Intake 1437.5 ml  Output   1600 ml  Net -162.5 ml    Exam:   General:  Pt is alert, follows commands appropriately, not in acute distress  Cardiovascular: Regular rate and rhythm, no rubs, no gallops  Respiratory: Clear to auscultation bilaterally, no wheezing, no crackles, no rhonchi  Abdomen: Soft, non tender, non distended, bowel sounds present, no guarding  Data Reviewed: Basic Metabolic Panel:  Recent Labs Lab 11/28/14 1030 11/29/14 0505 12/01/14 0545 12/02/14 0427 12/03/14 0501  NA 136 138 140 136 139  K 3.7 4.0 3.5 3.0* 3.0*  CL 104 107 109 104 105  CO2 21* 23 18* 25 26  GLUCOSE 69 100* 52* 157* 137*  BUN 33* 34* CREATININE 1.18* 1.17* 0.78 0.73 0.60  CALCIUM 9.5 8.3* 8.4* 8.2* 8.1*  MG  --  1.5*  --  1.5*  --    Liver Function Tests:  Recent Labs Lab 12/03/14 0806  AST 33  ALT 17  ALKPHOS 48  BILITOT 0.9  PROT 6.2*  ALBUMIN 2.7*    Recent Labs Lab 12/03/14 0806  LIPASE 33   CBC:  Recent Labs Lab 11/29/14 0505 12/01/14 0545 12/02/14 0427 12/03/14 0501  WBC 9.4 10.4 7.4 7.0  HGB 8.8* 8.9* 9.6* 8.8*  HCT 27.3* 28.5* 30.0* 27.3*  MCV 92.5 96.3 92.3 91.6  PLT 229 236 235 225   CBG:  Recent Labs Lab 11/28/14 1012  GLUCAP 67   Scheduled Meds: . cefTAZidime   IV  1 g Intravenous Q12H  . cycloSPORINE  1 drop Both Eyes BID  . enoxaparin  injection  30 mg Subcutaneous Q24H  . erythromycin  1 application Both Eyes QHS  . hydrocortisone  inj  20 mg Intravenous Daily  . ipratropium-albuterol  3 mL Nebulization BID  . metoprolol  10 mg Intravenous 4 times per day  . pantoprazole  IV  40 mg Intravenous QHS   Continuous Infusions: . dextrose 5 % and 0.45 % NaCl with KCl 40 mEq/L 75 mL/hr at 12/03/14 [phone]

## 2014-12-03 NOTE — Addendum Note (Signed)
Addendum  created 12/03/14 1524 by Ronelle Nighharles Zamia Tyminski, MD   Modules edited: Anesthesia Responsible Staff

## 2014-12-03 NOTE — Care Management Note (Signed)
Case Management Note  Patient Details  Name: Julie Richardson MRN: [id number] Date of Birth: 06/10/1925  Subjective/Objective:      Colectomy              Action/Plan:  From [facility], [facility]    Expected Discharge Date:                  Expected Discharge Plan:  Skilled Nursing Facility  In-House Referral:  Clinical Social Work  Discharge planning Services  CM Consult  Post Acute Care Choice:    Choice offered to:     DME Arranged:    DME Agency:     HH Arranged:    HH Agency:     Status of Service:  Completed, signed off  Medicare Important Message Given:  Yes-second notification given Date Medicare IM Given:    Medicare IM give by:    Date Additional Medicare IM Given:    Additional Medicare Important Message give by:     If discussed at Long Length of Stay Meetings, dates discussed:    Additional CommentsGeni Bers:  Julie Larrivee, RN 12/03/2014, 4:16 PM

## 2014-12-03 NOTE — Progress Notes (Addendum)
[facility]  [address] [place], [place] [address] Phone: [phone] FAX: Kite [phone] 10/23/1925   Problem List:   Principal Problem:   Colovaginal fistula s/p robotic colectomy/repair 11/28/2014 Active Problems:   Slurring of speech   HOH (hard of hearing)   COPD (chronic obstructive pulmonary disease) (HCC)   TIA (transient ischemic attack)   GERD (gastroesophageal reflux disease)   Bronchiectasis with Pseudomonas without acute exacerbation (HCC)   Chronic antibiotic suppression - Azithromycin for chronic bronchiectasis with Pseudomonas   Hypokalemia   Hypomagnesemia   5 Days Post-Op  11/28/2014  Procedure(s): XI ROBOT ASSISTED SIGMOID COLECTOMY, REPAIR OF COLOVAGINAL FISTULA,RIGID PROCTOSCOPY  Assessment  Severe dysphagia near UES of uncertain etiology  Plan:  -severe dysphagia by history & fluoroscopy  - d/w SLP & ENT (Dr Constance Holster).  Barium swallow confirms this.    Still awaiting evaluation by ENT - hopefully this AM.   D/w pt & her son.    I suspect pt will need gastric feeding tube to help reliably get nutrition & meds enterally.  I suspect dysphagia will be a permanent problem & will not fully recover - but could improve w ENT/SLP help.  Pt has friend w G tube that is active/functional, and she & her son are very open to the idea of a G tube.  Patient is still somewhat active & independent so they remain reasonably aggressive.    She dislikes the idea of an NGT or ND feeding tube.  I agree that nasal tubes are a poor long term solution.    Would most likely ask GI to do PEG to also evaluate esophagus & stomach by EGD as well.    Need input from ENT 1st though . -ileus resolved from colectomy/colovaginal fistula repair - adv diet when safe to eat  -pathology benign - d/w pt/son & copy of report in the room  -hypoK - replacing  -hypoMag - replaced  -IVFs  -changed narcotics w bad  dreams & mild grogginess - MS better  -inc HR controlled w IV metoprolol  -HTN control - challenge w NPO.    -chronic brochiectasis - give IV Ceftazidime w NPO & cannot take usual Azithromycin & h/o Pseudomonas bronchiectasis with WBC increasing   -IV steroid coverage baseline for now until can take PO  -VTE prophylaxis- SCDs, etc  -mobilize as tolerated to help recovery  I updated the patient's status to the patient, her son ,  & her RN.  Recommendations were made.  Questions were answered.  The patient & RN expressed understanding & appreciation.   Adin Hector, M.D., F.A.C.S. Gastrointestinal and Minimally Invasive Surgery [facility] [address] [place], [place] [address] [phone] Main / Paging   12/03/2014  Subjective:  Had bad dream & had inc HR Walking more C/o dry mouth Wants to eat  Objective:  Vital signs:  Filed Vitals:   12/02/14 1822 12/02/14 1945 12/02/14 2100 12/03/14 0428  BP: 83/59  103/79 147/94  Pulse: 87  77 68  Temp:   98.2 F (36.8 C) 97.6 F (36.4 C)  TempSrc:   Oral Oral  Resp:   16 17  Height:      Weight:    39.4 kg (86 lb 13.8 oz)  SpO2:  97% 98% 98%    Last BM Date: 12/01/14  Intake/Output   Yesterday:  10/11 0701 - 10/12 0700 In: 1175 [I.V.:825; IV Piggyback:350]  Out: 1150 [Urine:1150] This shift:  Total I/O In: -  Out: 350 [Urine:350]  Bowel function:  Flatus: y  BM: n  Drain: n/a  Physical Exam:  General: Pt awake/alert/oriented x4 in no acute distress Eyes: PERRL, normal EOM.  Sclera clear.  No icterus Neuro: CN II-XII intact w/o focal sensory/motor deficits. Lymph: No head/neck/groin lymphadenopathy Psych:  No delerium/psychosis/paranoia HENT: Normocephalic, Mucus membranes dry.  No thrush.  Remains HOH Neck: Supple, No tracheal deviation Chest: No chest wall pain w good excursion CV:  Pulses intact.  Regular rhythm MS: Normal AROM mjr joints.  No obvious  deformity Abdomen: Soft.  Nondistended.  Mildly tender at incisions only.  No evidence of peritonitis.  No incarcerated hernias. Ext:  SCDs BLE.  No mjr edema.  No cyanosis Skin: No petechiae / purpura  Results:   Labs: Results for orders placed or performed during the hospital encounter of 11/28/14 (from the past 48 hour(s))  CBC     Status: Abnormal   Collection Time: 12/02/14  4:27 AM  Result Value Ref Range   WBC 7.4 4.0 - 10.5 K/uL   RBC 3.25 (L) 3.87 - 5.11 MIL/uL   Hemoglobin 9.6 (L) 12.0 - 15.0 g/dL   HCT 30.0 (L) 36.0 - 46.0 %   MCV 92.3 78.0 - 100.0 fL   MCH 29.5 26.0 - 34.0 pg   MCHC 32.0 30.0 - 36.0 g/dL   RDW 14.3 11.5 - 15.5 %   Platelets 235 150 - 400 K/uL  Basic metabolic panel     Status: Abnormal   Collection Time: 12/02/14  4:27 AM  Result Value Ref Range   Sodium 136 135 - 145 mmol/L   Potassium 3.0 (L) 3.5 - 5.1 mmol/L   Chloride 104 101 - 111 mmol/L   CO2 25 22 - 32 mmol/L   Glucose, Bld 157 (H) 65 - 99 mg/dL   BUN 11 6 - 20 mg/dL   Creatinine, Ser 0.73 0.44 - 1.00 mg/dL   Calcium 8.2 (L) 8.9 - 10.3 mg/dL   GFR calc non Af Amer >60 >60 mL/min   GFR calc Af Amer >60 >60 mL/min    Comment: (NOTE) The eGFR has been calculated using the CKD EPI equation. This calculation has not been validated in all clinical situations. eGFR's persistently <60 mL/min signify possible Chronic Kidney Disease.    Anion gap 7 5 - 15  Magnesium     Status: Abnormal   Collection Time: 12/02/14  4:27 AM  Result Value Ref Range   Magnesium 1.5 (L) 1.7 - 2.4 mg/dL  CBC     Status: Abnormal   Collection Time: 12/03/14  5:01 AM  Result Value Ref Range   WBC 7.0 4.0 - 10.5 K/uL   RBC 2.98 (L) 3.87 - 5.11 MIL/uL   Hemoglobin 8.8 (L) 12.0 - 15.0 g/dL   HCT 27.3 (L) 36.0 - 46.0 %   MCV 91.6 78.0 - 100.0 fL   MCH 29.5 26.0 - 34.0 pg   MCHC 32.2 30.0 - 36.0 g/dL   RDW 14.7 11.5 - 15.5 %   Platelets 225 150 - 400 K/uL  Basic metabolic panel     Status: Abnormal   Collection  Time: 12/03/14  5:01 AM  Result Value Ref Range   Sodium 139 135 - 145 mmol/L   Potassium 3.0 (L) 3.5 - 5.1 mmol/L   Chloride 105 101 - 111 mmol/L   CO2 26 22 - 32 mmol/L   Glucose, Bld 137 (  H) 65 - 99 mg/dL   BUN 8 6 - 20 mg/dL   Creatinine, Ser 0.60 0.44 - 1.00 mg/dL   Calcium 8.1 (L) 8.9 - 10.3 mg/dL   GFR calc non Af Amer >60 >60 mL/min   GFR calc Af Amer >60 >60 mL/min    Comment: (NOTE) The eGFR has been calculated using the CKD EPI equation. This calculation has not been validated in all clinical situations. eGFR's persistently <60 mL/min signify possible Chronic Kidney Disease.    Anion gap 8 5 - 15    Imaging / Studies: Dg Chest 2 View  12/02/2014  CLINICAL DATA:  Dysphasia.  Shortness of breath. EXAM: CHEST  2 VIEW COMPARISON:  None. FINDINGS: Mediastinum and hilar structures are normal. Cardiomegaly with mild pulmonary vascular prominence and interstitial prominence with small pleural effusions. These findings suggest mild congestive heart failure. Bibasilar subsegmental atelectasis and/or scarring. No acute bony abnormality. IMPRESSION: Cardiomegaly with mild pulmonary vascular prominence and interstitial prominence with small bilateral pleural effusions consistent with mild congestive heart failure. Electronically Signed   By: Marcello Moores  Register   On: 12/02/2014 09:56   Dg Esophagus  12/02/2014  CLINICAL DATA:  Dysphagia. Aspiration on swallowing function study yesterday. History of aspiration pneumonia. EXAM: ESOPHOGRAM/BARIUM SWALLOW TECHNIQUE: Single contrast examination was performed using thin and thick barium. FLUOROSCOPY TIME:  Radiation Exposure Index (as provided by the fluoroscopic device): If the device does not provide the exposure index: Fluoroscopy Time:  2 minutes 5 seconds Number of Acquired Images: COMPARISON:  None. FINDINGS: Abnormal pharyngeal phase of swallowing. There was pooling of thick and thin barium in the valleculae and piriform sinuses. The patient  had difficulty coordinating swallowing. Patient had difficulty moving the bolus into the esophagus. Initial swallows with thin barium revealed penetration without aspiration. Additional thick barium swallows progressed to frank aspiration of a mild amount. The esophageal motility is diminished. No stricture or mass. Limited evaluation of the esophagus due to limited barium in the esophagus. IMPRESSION: Poor esophageal motility with difficulty propelling the bolus into the esophagus. There is pooling in the pharynx. There was aspiration of thick barium. There was penetration with thin barium. Limited evaluation the esophagus due to small boluses. There is esophageal dysmotility. No stricture or obstruction of the esophagus. Electronically Signed   By: Franchot Gallo M.D.   On: 12/02/2014 11:40   Dg Swallowing Func-speech Pathology  12/01/2014  Objective Swallowing Evaluation:   MBS Patient Details Name: Julie Richardson MRN: [id number] Date of Birth: [birth year] Today's Date: 12/01/2014 Time: SLP Start Time (ACUTE ONLY): 1348-SLP Stop Time (ACUTE ONLY): 1406 SLP Time Calculation (min) (ACUTE ONLY): 18 min Past Medical History: Past Medical History Diagnosis Date . HOH (hard of hearing)  . Anemia  . Diabetes mellitus without complication ([place])  . History of home oxygen therapy    uses 2 liters at hs and prn during day . Atrial fibrillation (HCC)    hx of . Spinal stenosis  . GERD (gastroesophageal reflux disease)  . Chronic kidney disease    stage 3 . Arthritis  . Dysphagia    uses thickened liquids . Hyperlipidemia  . Pelvis fracture ([place]) 6 years ago . Complication of anesthesia    slow to wake up . Blind left eye  . HOH (hard of hearing)  . TIA (transient ischemic attack)    7 YEARS AGO, NONE SINCE . Pneumonia oct 2015   ASPIRATION PNEUMONIA X 3- 4 TIMES IN PAST . COPD (chronic obstructive pulmonary disease) ([place])  pseodonomonus bronchiectasis Past Surgical History: Past Surgical History Procedure Laterality Date .  Abdominal hysterectomy   . Right shoulder surgery  years ago   for fracture . Tonsillectomy  as child HPI: Other Pertinent Information: Pt is s/p XI robot assisted SIGMOID COLECTOMY, REPAIR OF COLOVAGINAL FISTULA,RIGID PROCTOSCOPY.   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 (overnight in 08/2013).  Pt reports using nectar thick liquids over the last year or so.  Swallow eval ordered as pt reports difficulty swallowing medicine - even when crushed.   No Data Recorded Assessment / Plan / Recommendation CHL IP CLINICAL IMPRESSIONS 12/01/2014 Therapy Diagnosis Suspected primary esophageal dysphagia;Mild pharyngeal phase dysphagia Clinical Impression Patient presents with a suspected primary esophageal dysphagia with mild pharyngeal component. Oral phase WFL. Timing of swallow adequate. Anterior hyo-laryngeal movement appearing mildly reduced by not significant enough to account for severely impaired opening of the UES. Boluses of all consistencies trialed sit in pyriform sinuses post swallow with little-no clearance of bolus into the esophagus. SLP instructed patient for a variety of head postures (head turns, tilts, and chin tucks) without change in function. Patient with eventual penetration and aspiration of consistencies provided with intact sensation and clearance of airway but inability to clear via esophagus requiring expectoration of bolus. Swallow is at this time non-functional given little-no clearance of bolus into esophagus. Per patient and MBS note from 2015, dysphagia only mild and functional. Patient was consuming po prior to admission. Of note, discussion with Dr. Broadus John revealed that patient has had multiple recent admissions to [facility] for aspiration PNA. At this time, while brief intubation and deconditioning may account for some decline in dysfunction, question if primary esophageal deficit is to blame  for current level of dysfunction. Unclear baseline further complicates diagnostics. ENT or GI consult may be beneficial in ruling out primary UES dysfunction and determining potential for intervention to improve. SLP will continue to f/u at bedside to monitor for a facilitate improved swallowing function, assisting to determine potential to resume a po diet.    CHL IP TREATMENT RECOMMENDATION 12/01/2014 Treatment Recommendations Therapy as outlined in treatment plan below   CHL IP DIET RECOMMENDATION 12/01/2014 SLP Diet Recommendations Ice chips PRN after oral care;NPO Liquid Administration via (None) Medication Administration Via alternative means Compensations (None) Postural Changes and/or Swallow Maneuvers (None)   CHL IP OTHER RECOMMENDATIONS 12/01/2014 Recommended Consults Consider ENT evaluation;Consider GI evaluation Oral Care Recommendations Oral care QID Other Recommendations (None)     CHL IP FREQUENCY AND DURATION 12/01/2014 Speech Therapy Frequency (ACUTE ONLY) min 1 x/week Treatment Duration 1 week       CHL IP REASON FOR REFERRAL 12/01/2014 Reason for Referral Objectively evaluate swallowing function       CHL IP GO 09/18/2013 Functional Assessment Tool Used skilled clinical judgment Functional Limitations Swallowing Swallow Current Status (I2035) CJ Swallow Goal Status (D9741) [place] Swallow Discharge Status (U3845) CJ Motor Speech Current Status (X6468) (None) Motor Speech Goal Status (E3212) (None) Motor Speech Goal Status (Y4825) (None) Spoken Language Comprehension Current Status (O0370) (None) Spoken Language Comprehension Goal Status (W8889) (None) Spoken Language Comprehension Discharge Status (V6945) (None) Spoken Language Expression Current Status (W3888) (None) Spoken Language Expression Goal Status (K8003) (None) Spoken Language Expression Discharge Status (K9179) (None) Attention Current Status (X5056) (None) Attention Goal Status (P7948) (None) Attention Discharge Status (A1655) (None) Memory  Current Status (V7482) (None) Memory Goal Status (L0786) (None) Memory Discharge Status (  G9170) (None) Voice Current Status [phone]) (None) Voice Goal Status [phone]) (None) Voice Discharge Status [phone]) (None) Other Speech-Language Pathology Functional Limitation [phone]) (None) Other Speech-Language Pathology Functional Limitation Goal Status [phone]) (None) Other Speech-Language Pathology Functional Limitation Discharge Status [phone]) (None) Gabriel Rainwater MA, CCC-SLP [phone]   McCoy Leah Meryl 12/01/2014, 2:37 PM    Medications / Allergies: per chart  Antibiotics: Anti-infectives    Start     Dose/Rate Route Frequency Ordered Stop   12/01/14 1500  cefTAZidime (FORTAZ) 1 g in dextrose 5 % 50 mL IVPB    Comments:  When taking PO well, switch to home azithromycin 285m PO daily   1 g 100 mL/hr over 30 Minutes Intravenous Every 12 hours 12/01/14 1332     11/28/14 2300  cefoTEtan (CEFOTAN) 2 g in dextrose 5 % 50 mL IVPB     2 g 100 mL/hr over 30 Minutes Intravenous Every 12 hours 11/28/14 1736 11/28/14 2330   11/28/14 1532  clindamycin (CLEOCIN) 900 mg, gentamicin (GARAMYCIN) 240 mg in sodium chloride 0.9 % 1,000 mL for intraperitoneal lavage  Status:  Discontinued       As needed 11/28/14 1532 11/28/14 1535   11/28/14 1006  cefoTEtan (CEFOTAN) 2 g in dextrose 5 % 50 mL IVPB     2 g 100 mL/hr over 30 Minutes Intravenous On call to O.R. 11/28/14 1006 11/28/14 1305   11/28/14 0600  clindamycin (CLEOCIN) 900 mg, gentamicin (GARAMYCIN) 240 mg in sodium chloride 0.9 % 1,000 mL for intraperitoneal lavage  Status:  Discontinued    Comments:  Pharmacy may adjust dosing strength, schedule, rate of infusion, etc as needed to optimize therapy    Intraperitoneal To Surgery 11/27/14 1323 11/28/14 1643        Note: Portions of this report may have been transcribed using voice recognition software. Every effort was made to ensure accuracy; however, inadvertent computerized transcription errors may be  present.   Any transcriptional errors that result from this process are unintentional.     SAdin Hector M.D., F.A.C.S. Gastrointestinal and Minimally Invasive Surgery [facility] [address] [place] [phone] / Paging   12/03/2014  CARE TEAM:  PCP: PWillodean Rosenthal MD  Outpatient Care Team: Patient Care Team: JWillodean Rosenthal MD as PCP - General (Internal Medicine) SBjorn Loser MD as Consulting Physician (Urology) SMichael Boston MD as Consulting Physician (General Surgery) JTeena Irani MD as Consulting Physician (Gastroenterology)  Inpatient Treatment Team: Treatment Team: Attending Provider: SMichael Boston MD; Registered Nurse: LBailey Mech RN; Technician: SLarene Beach NT; Technician: CMertha Baars NT; Consulting Physician: JIzora Gala MD; Registered Nurse: LJoellen Jersey RN; Technician: KCarlisle Beers NT; Speech Language Pathologist: TNarda Rutherford CLicking Registered Nurse: FEli Hose RN; Rounding Team: WGarner Gavel MD

## 2014-12-03 NOTE — Progress Notes (Signed)
CSW continuing to follow.   CSW received notification that pt is planned for PEG tube placement.   CSW met with pt at bedside to provide support. Pt reports that she feels well overall and eager to have PEG placed in order to leave the hospital. Pt coping appropriately with plans for PEG placement. Pt aware that pt will need to go to [facility] level of care upon discharge in order to have assistance with new PEG tube. Pt expressed understanding and stated that pt son has also discussed with [facility].  CSW contacted [facility] and facility confirmed that they can accept pt at SNF level to assist with management and teaching of PEG placement.   CSW to continue to follow to provide support and assist with pt disposition needs.  Alison Murray, MSW, [place] [facility] [phone]

## 2014-12-04 ENCOUNTER — Inpatient Hospital Stay (HOSPITAL_COMMUNITY): Payer: Medicare Other

## 2014-12-04 DIAGNOSIS — J438 Other emphysema: Secondary | ICD-10-CM

## 2014-12-04 DIAGNOSIS — R1310 Dysphagia, unspecified: Secondary | ICD-10-CM | POA: Insufficient documentation

## 2014-12-04 LAB — BASIC METABOLIC PANEL
ANION GAP: 6 (ref 5–15)
BUN: <5 mg/dL — ABNORMAL LOW (ref 6–20)
CO2: 28 mmol/L (ref 22–32)
Calcium: 8 mg/dL — ABNORMAL LOW (ref 8.9–10.3)
Chloride: 101 mmol/L (ref 101–111)
Creatinine, Ser: 0.58 mg/dL (ref 0.44–1.00)
GFR calc Af Amer: >60 mL/min (ref >60)
GLUCOSE: 116 mg/dL — AB (ref 65–99)
Potassium: 3.6 mmol/L (ref 3.5–5.1)
Sodium: 135 mmol/L (ref 135–145)

## 2014-12-04 LAB — MAGNESIUM: MAGNESIUM: 2.4 mg/dL (ref 1.7–2.4)

## 2014-12-04 LAB — CBC
HCT: 29.3 % — ABNORMAL LOW (ref 36.0–46.0)
Hemoglobin: 9.5 g/dL — ABNORMAL LOW (ref 12.0–15.0)
MCH: 29.7 pg (ref 26.0–34.0)
MCHC: 32.4 g/dL (ref 30.0–36.0)
MCV: 91.6 fL (ref 78.0–100.0)
Platelets: 236 10*3/uL (ref 150–400)
RBC: 3.2 MIL/uL — ABNORMAL LOW (ref 3.87–5.11)
RDW: 14.8 % (ref 11.5–15.5)
WBC: 6.6 10*3/uL (ref 4.0–10.5)

## 2014-12-04 LAB — PROTIME-INR
INR: 1.3 (ref 0.00–1.49)
PROTHROMBIN TIME: 16.3 s — AB (ref 11.6–15.2)

## 2014-12-04 LAB — APTT: aPTT: 26 seconds (ref 24–37)

## 2014-12-04 MED ORDER — MIDAZOLAM HCL 2 MG/2ML IJ SOLN
INTRAMUSCULAR | Status: AC
  Filled 2014-12-04: qty 6

## 2014-12-04 MED ORDER — GLUCAGON HCL (RDNA) 1 MG IJ SOLR
INTRAMUSCULAR | Status: DC | PRN
  Administered 2014-12-04: 1 mg via INTRAVENOUS

## 2014-12-04 MED ORDER — IOHEXOL 300 MG/ML  SOLN
20.0000 mL | Freq: Once | INTRAMUSCULAR | Status: DC | PRN
  Administered 2014-12-04: 20 mL
  Filled 2014-12-04: qty 20

## 2014-12-04 MED ORDER — FENTANYL CITRATE (PF) 100 MCG/2ML IJ SOLN
INTRAMUSCULAR | Status: AC | PRN
  Administered 2014-12-04: 25 ug via INTRAVENOUS

## 2014-12-04 MED ORDER — FENTANYL CITRATE (PF) 100 MCG/2ML IJ SOLN
INTRAMUSCULAR | Status: AC
  Filled 2014-12-04: qty 4

## 2014-12-04 MED ORDER — LIDOCAINE-EPINEPHRINE 2 %-1:100000 IJ SOLN
INTRAMUSCULAR | Status: AC
  Filled 2014-12-04: qty 1

## 2014-12-04 MED ORDER — POTASSIUM CHLORIDE 10 MEQ/100ML IV SOLN
10.0000 meq | INTRAVENOUS | Status: AC
  Administered 2014-12-04 (×3): 10 meq via INTRAVENOUS
  Filled 2014-12-04: qty 100

## 2014-12-04 MED ORDER — MIDAZOLAM HCL 2 MG/2ML IJ SOLN
INTRAMUSCULAR | Status: AC | PRN
  Administered 2014-12-04 (×3): 0.5 mg via INTRAVENOUS

## 2014-12-04 MED ORDER — GLUCAGON HCL RDNA (DIAGNOSTIC) 1 MG IJ SOLR
INTRAMUSCULAR | Status: AC
Start: 2014-12-04 — End: 2014-12-05
  Filled 2014-12-04: qty 1

## 2014-12-04 MED ORDER — POTASSIUM CHLORIDE 10 MEQ/100ML IV SOLN
INTRAVENOUS | Status: AC
  Administered 2014-12-04: 10 meq
  Filled 2014-12-04: qty 100

## 2014-12-04 NOTE — Progress Notes (Addendum)
ENT workup confirms severe swallowing difficulty.  No definite anatomical issue.  More likely neurologic/physiologic.  Dr Pollyann Kennedyosen agrees patient most likely benefit from gastrostomy tube to allow more time for therapy and see if some swelling recovery can happen.  Patient [facility].  Discussed with Dr. Dulce Sellarutlaw who is on-call.  He will inform Dr. Evette CristalGanem whom will take over coverage this afternoon.  Consider EGD given the concern of esophageal dysmotility to rule out distal anatomic issues.  Consider PEG gastrostomy tube placement tomorrow.  That way instead of getting 2 procedures with an EGD and then G-tube placement by radiology or myself, can do all this with one procedure only.  Would like to avoid general anesthesia again in the pt if possible since she had issues with ET tube irritation.  Ardeth SportsmanSteven C. Zebulin Siegel, M.D., F.A.C.S. Gastrointestinal and Minimally Invasive Surgery [facility] [address] [place] [address] [phone] Main / Paging

## 2014-12-04 NOTE — Consult Note (Signed)
Referring Provider: Dr. Estelle Grumbles Primary Care Physician:  Darlina Guys, MD Primary Gastroenterologist:  Dr. Dorena Cookey  Reason for Consultation:  Oropharyngeal dysphagia  HPI: Julie Richardson is a 81 y.o. female status post recent surgical repair of a rectovaginal fistula, who, since her surgery, has had progressive difficulty swallowing. She has a history of aspiration and pneumonia. Swallowing evaluation by the speech therapist and ENT evaluation disclosed no anatomic pathology in the region of the cricopharyngeus, but definite aspiration was noted. In addition, the patient has a history of underlying COPD, and uses home O2 at night.  Past Medical History  Diagnosis Date  . HOH (hard of hearing)   . Anemia   . Diabetes mellitus without complication (HCC)   . History of home oxygen therapy     uses 2 liters at hs and prn during day  . Atrial fibrillation (HCC)     hx of  . Spinal stenosis   . GERD (gastroesophageal reflux disease)   . Chronic kidney disease     stage 3  . Arthritis   . Dysphagia     uses thickened liquids  . Hyperlipidemia   . Pelvis fracture (HCC) 6 years ago  . Complication of anesthesia     slow to wake up  . Blind left eye   . HOH (hard of hearing)   . TIA (transient ischemic attack)     7 YEARS AGO, NONE SINCE  . Pneumonia oct 2015    ASPIRATION PNEUMONIA X 3- 4 TIMES IN PAST  . COPD (chronic obstructive pulmonary disease) (HCC)     pseodonomonus bronchiectasis    Past Surgical History  Procedure Laterality Date  . Abdominal hysterectomy    . Right shoulder surgery  years ago    for fracture  . Tonsillectomy  as child    Prior to Admission medications   Medication Sig Start Date End Date Taking? Authorizing Provider  acetaminophen (TYLENOL) 325 MG tablet Take 650 mg by mouth 2 (two) times daily.    Yes Historical Provider, MD  Ascorbic Acid (VITAMIN C) 1000 MG tablet Take 1,000 mg by mouth 2 (two) times daily.   Yes Historical Provider, MD   atorvastatin (LIPITOR) 20 MG tablet Take 20 mg by mouth at bedtime.    Yes Historical Provider, MD  azithromycin (ZITHROMAX) 250 MG tablet Take 250 mg by mouth daily. For chronic obstructive pulmonary disease   Yes Historical Provider, MD  bisacodyl (DULCOLAX) 5 MG EC tablet Take 5 mg by mouth 2 (two) times daily.   Yes Historical Provider, MD  cholecalciferol (VITAMIN D) 1000 UNITS tablet Take 2,000 Units by mouth daily.   Yes Historical Provider, MD  cycloSPORINE (RESTASIS) 0.05 % ophthalmic emulsion Place 1 drop into both eyes 2 (two) times daily.   Yes Historical Provider, MD  erythromycin ophthalmic ointment Place 1 application into both eyes at bedtime.   Yes Historical Provider, MD  ipratropium-albuterol (DUONEB) 0.5-2.5 (3) MG/3ML SOLN Take 3 mLs by nebulization 3 (three) times daily.   Yes Historical Provider, MD  omeprazole (PRILOSEC) 20 MG capsule Take 20 mg by mouth daily.   Yes Historical Provider, MD  Polyethyl Glycol-Propyl Glycol (LUBRICANT EYE DROPS) 0.4-0.3 % SOLN Apply 1 drop to eye as needed (for dry eyes).   Yes Historical Provider, MD  polyethylene glycol (MIRALAX / GLYCOLAX) packet Take 17 g by mouth daily.   Yes Historical Provider, MD  predniSONE (DELTASONE) 5 MG tablet Take 5 mg by mouth daily with breakfast.  Yes Historical Provider, MD  sotalol (BETAPACE) 80 MG tablet Take 80 mg by mouth 2 (two) times daily.   Yes Historical Provider, MD  hydrocortisone (ANUSOL-HC) 2.5 % rectal cream Place 1 application rectally as needed for hemorrhoids or itching.    Historical Provider, MD  hydroxypropyl methylcellulose / hypromellose (ISOPTO TEARS / GONIOVISC) 2.5 % ophthalmic solution Place 1 drop into both eyes as needed for dry eyes.    Historical Provider, MD  traMADol (ULTRAM) 50 MG tablet Take 1-2 tablets (50-100 mg total) by mouth every 6 (six) hours as needed for moderate pain or severe pain. 11/28/14   Karie Soda, MD    Current Facility-Administered Medications   Medication Dose Route Frequency Provider Last Rate Last Dose  . 0.9 %  sodium chloride infusion  250 mL Intravenous PRN Karie Soda, MD      . antiseptic oral rinse (BIOTENE) solution 15 mL  15 mL Mouth Rinse TID Karie Soda, MD   15 mL at 12/04/14 1000  . cefTAZidime (FORTAZ) 1 g in dextrose 5 % 50 mL IVPB  1 g Intravenous Q12H Karie Soda, MD   1 g at 12/04/14 0224  . cycloSPORINE (RESTASIS) 0.05 % ophthalmic emulsion 1 drop  1 drop Both Eyes BID Karie Soda, MD   1 drop at 12/04/14 0951  . dextrose 5 % and 0.45 % NaCl with KCl 40 mEq/L infusion   Intravenous Continuous Karie Soda, MD 75 mL/hr at 12/04/14 0001    . diltiazem (CARDIZEM) injection 10 mg  10 mg Intravenous Once Zannie Cove, MD      . diphenhydrAMINE (BENADRYL) injection 12.5 mg  12.5 mg Intravenous Q6H PRN Karie Soda, MD      . enoxaparin (LOVENOX) injection 30 mg  30 mg Subcutaneous Q24H Karie Soda, MD   30 mg at 12/04/14 0950  . erythromycin ophthalmic ointment 1 application  1 application Both Eyes QHS Karie Soda, MD   1 application at 12/03/14 2152  . fentaNYL (SUBLIMAZE) injection 25-50 mcg  25-50 mcg Intravenous Q1H PRN Karie Soda, MD   50 mcg at 12/04/14 0224  . hydrocortisone (ANUSOL-HC) 2.5 % rectal cream 1 application  1 application Rectal PRN Karie Soda, MD      . hydrocortisone sodium succinate (SOLU-CORTEF) 100 MG injection 20 mg  20 mg Intravenous Daily Glenna Fellows, MD   20 mg at 12/04/14 0951  . ipratropium-albuterol (DUONEB) 0.5-2.5 (3) MG/3ML nebulizer solution 3 mL  3 mL Nebulization BID Karie Soda, MD   3 mL at 12/04/14 0914  . lidocaine (XYLOCAINE) 2 % jelly 1 application  1 application Topical Once PRN Serena Colonel, MD      . lidocaine (XYLOCAINE) 4 % external solution 0-50 mL  0-50 mL Topical Once PRN Serena Colonel, MD      . lip balm (CARMEX) ointment 1 application  1 application Topical BID Karie Soda, MD   1 application at 12/04/14 1000  . menthol-cetylpyridinium (CEPACOL) lozenge  3 mg  1 lozenge Oral PRN Karie Soda, MD      . metoprolol (LOPRESSOR) injection 10 mg  10 mg Intravenous 4 times per day Zannie Cove, MD   10 mg at 12/04/14 0602  . metoprolol (LOPRESSOR) injection 5 mg  5 mg Intravenous Q4H PRN Zannie Cove, MD   5 mg at 12/02/14 1532  . ondansetron (ZOFRAN) injection 4 mg  4 mg Intravenous Q6H PRN Karie Soda, MD      . oxymetazoline (AFRIN) 0.05 % nasal spray 1  spray  1 spray Each Nare Once PRN Serena ColonelJefry Rosen, MD      . pantoprazole (PROTONIX) injection 40 mg  40 mg Intravenous QHS Glenna FellowsBenjamin Hoxworth, MD   40 mg at 12/03/14 2151  . phenol (CHLORASEPTIC) mouth spray 2 spray  2 spray Mouth/Throat PRN Karie SodaSteven Gross, MD      . polyvinyl alcohol (LIQUIFILM TEARS) 1.4 % ophthalmic solution 1 drop  1 drop Both Eyes PRN Karie SodaSteven Gross, MD      . potassium chloride 10 mEq in 100 mL IVPB  10 mEq Intravenous Q1 Hr x 4 Karie SodaSteven Gross, MD   10 mEq at 12/04/14 0951  . promethazine (PHENERGAN) injection 6.25-12.5 mg  6.25-12.5 mg Intravenous Q4H PRN Karie SodaSteven Gross, MD      . RESOURCE THICKENUP CLEAR   Oral TID [facility] Karie SodaSteven Gross, MD   Stopped at 11/29/14 1200  . saccharomyces boulardii (FLORASTOR) capsule 250 mg  250 mg Oral BID Karie SodaSteven Gross, MD   250 mg at 11/29/14 1023  . sodium chloride 0.9 % injection 3 mL  3 mL Intravenous Q12H Karie SodaSteven Gross, MD   3 mL at 12/04/14 0952  . sodium chloride 0.9 % injection 3 mL  3 mL Intravenous PRN Karie SodaSteven Gross, MD        Allergies as of 11/10/2014 - Review Complete 09/15/2014  Allergen Reaction Noted  . Coumadin [warfarin sodium]  04/12/2014  . Sulfa antibiotics  04/12/2014    History reviewed. No pertinent family history.  Social History   Social History  . Marital Status: Widowed    Spouse Name: N/A  . Number of Children: N/A  . Years of Education: N/A   Occupational History  . Not on file.   Social History Main Topics  . Smoking status: Never Smoker   . Smokeless tobacco: Never Used  . Alcohol Use: No  . Drug Use: No  .  Sexual Activity: Not on file   Other Topics Concern  . Not on file   Social History Narrative    Review of Systems:   Physical Exam: Vital signs in last 24 hours: Temp:  [97.6 F (36.4 C)-98.4 F (36.9 C)] 97.6 F (36.4 C) (10/13 0435) Pulse Rate:  [81-93] 81 (10/13 0914) Resp:  [16-18] 18 (10/13 0914) BP: (110-162)/(77-94) 110/94 mmHg (10/13 0435) SpO2:  [92 %-97 %] 93 % (10/13 0914) Weight:  [41.5 kg (91 lb 7.9 oz)] 41.5 kg (91 lb 7.9 oz) (10/13 0435) Last BM Date: 12/01/14 This is a pleasant, alert and coherent, frail elderly Caucasian female in no distress and in particular in no respiratory distress. The suction canister has a lot of mucoid secretions within it. The chest has bibasilar in story crackles, no wheezes. Breath sounds are perhaps somewhat diminished. Heart unremarkable, abdomen soft and nontender, without scars although there is a small subcutaneous hematoma at the Lovenox injection site.  Intake/Output from previous day: 10/12 0701 - 10/13 0700 In: 1835 [I.Richardson.:1735; IV Piggyback:100] Out: 1250 [Urine:1250] Intake/Output this shift:    Lab Results:  Recent Labs  12/02/14 0427 12/03/14 0501 12/04/14 0517  WBC 7.4 7.0 6.6  HGB 9.6* 8.8* 9.5*  HCT 30.0* 27.3* 29.3*  PLT 235 225 236   BMET  Recent Labs  12/02/14 0427 12/03/14 0501 12/04/14 0517  NA 136 139 135  K 3.0* 3.0* 3.6  CL 104 105 101  CO2 25 26 28   GLUCOSE 157* 137* 116*  BUN 11 8 <5*  CREATININE 0.73 0.60 0.58  CALCIUM 8.2* 8.1* 8.0*  LFT  Recent Labs  12/03/14 0806  PROT 6.2*  ALBUMIN 2.7*  AST 33  ALT 17  ALKPHOS 48  BILITOT 0.9  BILIDIR 0.2  IBILI 0.7   PT/INR No results for input(s): LABPROT, INR in the last 72 hours.  Studies/Results: Dg Esophagus  12/02/2014  CLINICAL DATA:  Dysphagia. Aspiration on swallowing function study yesterday. History of aspiration pneumonia. EXAM: ESOPHOGRAM/BARIUM SWALLOW TECHNIQUE: Single contrast examination was performed using  thin and thick barium. FLUOROSCOPY TIME:  Radiation Exposure Index (as provided by the fluoroscopic device): If the device does not provide the exposure index: Fluoroscopy Time:  2 minutes 5 seconds Number of Acquired Images: COMPARISON:  None. FINDINGS: Abnormal pharyngeal phase of swallowing. There was pooling of thick and thin barium in the valleculae and piriform sinuses. The patient had difficulty coordinating swallowing. Patient had difficulty moving the bolus into the esophagus. Initial swallows with thin barium revealed penetration without aspiration. Additional thick barium swallows progressed to frank aspiration of a mild amount. The esophageal motility is diminished. No stricture or mass. Limited evaluation of the esophagus due to limited barium in the esophagus. IMPRESSION: Poor esophageal motility with difficulty propelling the bolus into the esophagus. There is pooling in the pharynx. There was aspiration of thick barium. There was penetration with thin barium. Limited evaluation the esophagus due to small boluses. There is esophageal dysmotility. No stricture or obstruction of the esophagus. Electronically Signed   By: Marlan Palau M.D.   On: 12/02/2014 11:40    Impression: 1. Oropharyngeal dysphagia, without radiographic evidence of an anatomic problem such as an esophageal neoplasm 2. Significant underlying COPD, with recent aspiration pneumonia 3. Status post recent surgical repair of a colovaginal fistula  Plan: I agree with the medical and surgical service, the ENT physician, speech therapist, and the patient herself that a gastrostomy tube is appropriate in this setting. In my opinion, this could be more safely placed, by radiologic rather than endoscopic means, because of the patient's COPD. Endoscopy requires more manipulation of the airway and deeper sedation (both of which can be problematic in a patient like this), compared to the IR approach. I do not feel that the patient needs  diagnostic endoscopy since no anatomic abnormality is identified on the barium swallow.  I have discussed the case with Dr. Michaell Cowing, and have contacted interventional radiology who will hopefully see the patient today.   LOS: 6 days   Iretta Mangrum Richardson  12/04/2014, 10:00 AM   Pager [phone] If no answer or after 5 PM call [phone]

## 2014-12-04 NOTE — Progress Notes (Signed)
Patient has been seen by IR and plans for G-tube placement in Radiology this afternoon noted.  I will sign off; call if we can be of assistance.  Florencia Reasonsobert V. Sukhman Martine, M.D. Pager [phone] If no answer or after 5 PM call [phone]

## 2014-12-04 NOTE — Progress Notes (Signed)
GI prelim note:  recomm IR g-tube.   Have d/w Dr. Michaell CowingGross.  Will contact IR  Florencia Reasonsobert V. Avanish Cerullo, M.D. Pager [phone] If no answer or after 5 PM call [phone]

## 2014-12-04 NOTE — Discharge Summary (Signed)
Physician Discharge Summary  Patient ID: Julie Richardson MRN: [id number] DOB/AGE: 09/15/1925 81 y.o.  Admit date: 11/28/2014 Discharge date: 12/10/2014  Patient Care Team: Willodean Rosenthal, MD as PCP - General (Internal Medicine) Bjorn Loser, MD as Consulting Physician (Urology) Michael Boston, MD as Consulting Physician (General Surgery) Teena Irani, MD as Consulting Physician (Gastroenterology)  Admission Diagnoses: Principal Problem:   Colovaginal fistula s/p robotic colectomy/repair 11/28/2014 Active Problems:   COPD (chronic obstructive pulmonary disease) (HCC)   GERD (gastroesophageal reflux disease)   Bronchiectasis with Pseudomonas without acute exacerbation (HCC)   Chronic antibiotic suppression - Azithromycin for chronic bronchiectasis with Pseudomonas   Hypokalemia   Hypomagnesemia   Protein-calorie malnutrition, moderate ([place])   Discharge Diagnoses:  Principal Problem:   Colovaginal fistula s/p robotic colectomy/repair 11/28/2014 Active Problems:   COPD (chronic obstructive pulmonary disease) ([place])   GERD (gastroesophageal reflux disease)   Bronchiectasis with Pseudomonas without acute exacerbation (HCC)   Chronic antibiotic suppression - Azithromycin for chronic bronchiectasis with Pseudomonas   Hypokalemia   Hypomagnesemia   Protein-calorie malnutrition, moderate ([place])   POST-OPERATIVE DIAGNOSIS:  Colovaginal Fistula most likely due to diverticulitis  PROCEDURE: XI ROBOT ASSISTED SIGMOID COLECTOMY REPAIR OF COLOVAGINAL FISTULA RIGID PROCTOSCOPY EXAMINATION UNDER ANESTHESIA  SURGEON:  Michael Boston, MD Leighton Ruff, MD - Assist  ANESTHESIA: local and general  Consults: 1.  ENT  2.  GI   3.  Internal Medicine  4.  Cardiology  Hospital Course:   The patient underwent the surgery above.  Postoperatively, the patient gradually mobilized.  Pain and other symptoms were treated aggressively.   She has complaining about worsening dysphagia to the point she  could not tolerate swallowing well.  Speech therapy evaluation was concern for a very high aspiration risk.  Your nose and throat was consulted.  They were concerned by their evaluation as well.  Organization was made for placement of gastrostomy tube for long-term enteral nutrition and medications in the hopes that she could have improvement in her swallowing with outpatient therapy.  There was concerns about another general anesthetic to do the procedure.  Gastroenterology logy did not feel comfortable placing a gastrostomy tube.  Radiology ultimately was willing to do it.  Medicines switched to via Gtube.  Patient had episode of hypotension & anemia  With SVT - switched to ICU, transfused, and given IV gtt cardizem & then amiodarone.  Quickly stabilized & stable on telemetery for several days. TFs switched to all G tube enteral.  Mild diarrhea controlled w immodium a few times.    PT/OT/SLP saw frequently.    By the time of discharge, the patient was walking well the hallways, nutritional needs meet via tube feeds via the Gtube, having flatus & BMs.  Pain was well-controlled on an G tube enteral medications.  Based on meeting discharge criteria and continuing to recover, I felt it was safe for the patient to be discharged from the hospital to further recover with close followup. Postoperative recommendations were discussed in detail.  They are written as well.   Significant Diagnostic Studies:  Results for orders placed or performed during the hospital encounter of 11/28/14 (from the past 72 hour(s))  CBC     Status: Abnormal   Collection Time: 12/02/14  4:27 AM  Result Value Ref Range   WBC 7.4 4.0 - 10.5 K/uL   RBC 3.25 (L) 3.87 - 5.11 MIL/uL   Hemoglobin 9.6 (L) 12.0 - 15.0 g/dL   HCT 30.0 (L) 36.0 - 46.0 %   MCV  92.3 78.0 - 100.0 fL   MCH 29.5 26.0 - 34.0 pg   MCHC 32.0 30.0 - 36.0 g/dL   RDW 14.3 11.5 - 15.5 %   Platelets 235 150 - 400 K/uL  Basic metabolic panel     Status: Abnormal    Collection Time: 12/02/14  4:27 AM  Result Value Ref Range   Sodium 136 135 - 145 mmol/L   Potassium 3.0 (L) 3.5 - 5.1 mmol/L   Chloride 104 101 - 111 mmol/L   CO2 25 22 - 32 mmol/L   Glucose, Bld 157 (H) 65 - 99 mg/dL   BUN 11 6 - 20 mg/dL   Creatinine, Ser 0.73 0.44 - 1.00 mg/dL   Calcium 8.2 (L) 8.9 - 10.3 mg/dL   GFR calc non Af Amer >60 >60 mL/min   GFR calc Af Amer >60 >60 mL/min    Comment: (NOTE) The eGFR has been calculated using the CKD EPI equation. This calculation has not been validated in all clinical situations. eGFR's persistently <60 mL/min signify possible Chronic Kidney Disease.    Anion gap 7 5 - 15  Magnesium     Status: Abnormal   Collection Time: 12/02/14  4:27 AM  Result Value Ref Range   Magnesium 1.5 (L) 1.7 - 2.4 mg/dL  CBC     Status: Abnormal   Collection Time: 12/03/14  5:01 AM  Result Value Ref Range   WBC 7.0 4.0 - 10.5 K/uL   RBC 2.98 (L) 3.87 - 5.11 MIL/uL   Hemoglobin 8.8 (L) 12.0 - 15.0 g/dL   HCT 27.3 (L) 36.0 - 46.0 %   MCV 91.6 78.0 - 100.0 fL   MCH 29.5 26.0 - 34.0 pg   MCHC 32.2 30.0 - 36.0 g/dL   RDW 14.7 11.5 - 15.5 %   Platelets 225 150 - 400 K/uL  Basic metabolic panel     Status: Abnormal   Collection Time: 12/03/14  5:01 AM  Result Value Ref Range   Sodium 139 135 - 145 mmol/L   Potassium 3.0 (L) 3.5 - 5.1 mmol/L   Chloride 105 101 - 111 mmol/L   CO2 26 22 - 32 mmol/L   Glucose, Bld 137 (H) 65 - 99 mg/dL   BUN 8 6 - 20 mg/dL   Creatinine, Ser 0.60 0.44 - 1.00 mg/dL   Calcium 8.1 (L) 8.9 - 10.3 mg/dL   GFR calc non Af Amer >60 >60 mL/min   GFR calc Af Amer >60 >60 mL/min    Comment: (NOTE) The eGFR has been calculated using the CKD EPI equation. This calculation has not been validated in all clinical situations. eGFR's persistently <60 mL/min signify possible Chronic Kidney Disease.    Anion gap 8 5 - 15  Lipase, blood     Status: None   Collection Time: 12/03/14  8:06 AM  Result Value Ref Range   Lipase 33 22 -  51 U/L  Hepatic function panel     Status: Abnormal   Collection Time: 12/03/14  8:06 AM  Result Value Ref Range   Total Protein 6.2 (L) 6.5 - 8.1 g/dL   Albumin 2.7 (L) 3.5 - 5.0 g/dL   AST 33 15 - 41 U/L   ALT 17 14 - 54 U/L   Alkaline Phosphatase 48 38 - 126 U/L   Total Bilirubin 0.9 0.3 - 1.2 mg/dL   Bilirubin, Direct 0.2 0.1 - 0.5 mg/dL   Indirect Bilirubin 0.7 0.3 - 0.9 mg/dL  Prealbumin  Status: Abnormal   Collection Time: 12/03/14  8:06 AM  Result Value Ref Range   Prealbumin 11.6 (L) 18 - 38 mg/dL    Comment: Performed at [facility]  CBC     Status: Abnormal   Collection Time: 12/04/14  5:17 AM  Result Value Ref Range   WBC 6.6 4.0 - 10.5 K/uL   RBC 3.20 (L) 3.87 - 5.11 MIL/uL   Hemoglobin 9.5 (L) 12.0 - 15.0 g/dL   HCT 29.3 (L) 36.0 - 46.0 %   MCV 91.6 78.0 - 100.0 fL   MCH 29.7 26.0 - 34.0 pg   MCHC 32.4 30.0 - 36.0 g/dL   RDW 14.8 11.5 - 15.5 %   Platelets 236 150 - 400 K/uL  Basic metabolic panel     Status: Abnormal   Collection Time: 12/04/14  5:17 AM  Result Value Ref Range   Sodium 135 135 - 145 mmol/L   Potassium 3.6 3.5 - 5.1 mmol/L   Chloride 101 101 - 111 mmol/L   CO2 28 22 - 32 mmol/L   Glucose, Bld 116 (H) 65 - 99 mg/dL   BUN <5 (L) 6 - 20 mg/dL   Creatinine, Ser 0.58 0.44 - 1.00 mg/dL   Calcium 8.0 (L) 8.9 - 10.3 mg/dL   GFR calc non Af Amer >60 >60 mL/min   GFR calc Af Amer >60 >60 mL/min    Comment: (NOTE) The eGFR has been calculated using the CKD EPI equation. This calculation has not been validated in all clinical situations. eGFR's persistently <60 mL/min signify possible Chronic Kidney Disease.    Anion gap 6 5 - 15  Magnesium     Status: None   Collection Time: 12/04/14  5:17 AM  Result Value Ref Range   Magnesium 2.4 1.7 - 2.4 mg/dL    No results found.  Discharge Exam: Blood pressure 110/94, pulse 81, temperature 97.6 F (36.4 C), temperature source Oral, resp. rate 18, height _0  (1.499 m), weight 41.5 kg (91  lb 7.9 oz), SpO2 93 %.  General: Pt awake/alert/oriented x4 in no major acute distress Eyes: PERRL, normal EOM. Sclera nonicteric Neuro: CN II-XII intact w/o focal sensory/motor deficits. Lymph: No head/neck/groin lymphadenopathy Psych:  No delerium/psychosis/paranoia HENT: Normocephalic, Mucus membranes moist.  No thrush.  Mild slurring of speech but nothing severe and stable from preop. Neck: Supple, No tracheal deviation Chest: No pain.  Good respiratory excursion. CV:  Pulses intact.  Regular rhythm MS: Normal AROM mjr joints.  No obvious deformity Abdomen: Soft, Nondistended.  Incisions with normal healing ridges.  Nontender.  No incarcerated hernias. Ext:  SCDs BLE.  No significant edema.  No cyanosis Skin: No petechiae / purpura  Discharged Condition: good   Past Medical History  Diagnosis Date  . HOH (hard of hearing)   . Anemia   . Diabetes mellitus without complication ([place])   . History of home oxygen therapy     uses 2 liters at hs and prn during day  . Atrial fibrillation (HCC)     hx of  . Spinal stenosis   . GERD (gastroesophageal reflux disease)   . Chronic kidney disease     stage 3  . Arthritis   . Dysphagia     uses thickened liquids  . Hyperlipidemia   . Pelvis fracture ([place]) 6 years ago  . Complication of anesthesia     slow to wake up  . Blind left eye   . HOH (hard of hearing)   .  TIA (transient ischemic attack)     7 YEARS AGO, NONE SINCE  . Pneumonia oct 2015    ASPIRATION PNEUMONIA X 3- 4 TIMES IN PAST  . COPD (chronic obstructive pulmonary disease) (HCC)     pseodonomonus bronchiectasis    Past Surgical History  Procedure Laterality Date  . Abdominal hysterectomy    . Right shoulder surgery  years ago    for fracture  . Tonsillectomy  as child    Social History   Social History  . Marital Status: Widowed    Spouse Name: N/A  . Number of Children: N/A  . Years of Education: N/A   Occupational History  . Not on file.   Social  History Main Topics  . Smoking status: Never Smoker   . Smokeless tobacco: Never Used  . Alcohol Use: No  . Drug Use: No  . Sexual Activity: Not on file   Other Topics Concern  . Not on file   Social History Narrative    History reviewed. No pertinent family history.  Current Facility-Administered Medications  Medication Dose Route Frequency Provider Last Rate Last Dose  . 0.9 %  sodium chloride infusion  250 mL Intravenous PRN Michael Boston, MD      . 0.9 %  sodium chloride infusion  250 mL Intravenous PRN Michael Boston, MD 10 mL/hr at 12/06/14 2128 250 mL at 12/06/14 2128  . 0.9 %  sodium chloride infusion  250 mL Intravenous PRN Michael Boston, MD      . amiodarone (PACERONE) tablet 200 mg  200 mg Oral Daily Brittainy Erie Noe, PA-C   200 mg at 12/09/14 1004  . antiseptic oral rinse (BIOTENE) solution 15 mL  15 mL Mouth Rinse TID Michael Boston, MD   15 mL at 12/09/14 2215  . ascorbic acid (VITAMIN C) 500 MG/5ML syrup 500 mg  500 mg Oral BID Michael Boston, MD   500 mg at 12/09/14 2210  . atorvastatin (LIPITOR) tablet 20 mg  20 mg Oral QHS Michael Boston, MD   20 mg at 12/09/14 2210  . azithromycin (ZITHROMAX) tablet 250 mg  250 mg Oral Daily Michael Boston, MD   250 mg at 12/09/14 1005  . bismuth subsalicylate (PEPTO BISMOL) 262 MG/15ML suspension 30 mL  30 mL Oral BID Michael Boston, MD   30 mL at 12/09/14 1735  . Chlorhexidine Gluconate Cloth 2 % PADS 6 each  6 each Topical Q0600 Michael Boston, MD   6 each at 12/10/14 [phone]  . cholecalciferol (VITAMIN D) tablet 2,000 Units  2,000 Units Oral Daily Michael Boston, MD   2,000 Units at 12/09/14 1004  . cycloSPORINE (RESTASIS) 0.05 % ophthalmic emulsion 1 drop  1 drop Both Eyes BID Michael Boston, MD   1 drop at 12/09/14 2213  . diltiazem (CARDIZEM) injection 10 mg  10 mg Intravenous Once Domenic Polite, MD      . diphenhydrAMINE (BENADRYL) injection 12.5 mg  12.5 mg Intravenous Q6H PRN Michael Boston, MD   12.5 mg at 12/08/14 0245  . erythromycin  ophthalmic ointment 1 application  1 application Both Eyes QHS Michael Boston, MD   1 application at 93/73/42 2213  . feeding supplement (JEVITY 1.2 CAL) liquid 237 mL  237 mL Per Tube QID Michael Boston, MD   237 mL at 12/09/14 2211  . fentaNYL (SUBLIMAZE) injection 25-50 mcg  25-50 mcg Intravenous Q2H PRN Michael Boston, MD      . ferrous sulfate 300 (60 FE) MG/5ML syrup 300  mg  300 mg Oral TID WC Michael Boston, MD   300 mg at 12/09/14 1735  . free water 100 mL  100 mL Per Tube QID Michael Boston, MD   100 mL at 12/09/14 2213  . glucagon (GLUCAGEN) injection   Intravenous PRN Sandi Mariscal, MD   1 mg at 12/04/14 1600  . hydrocortisone (ANUSOL-HC) 2.5 % rectal cream 1 application  1 application Rectal PRN Michael Boston, MD      . insulin aspart (novoLOG) injection 0-9 Units  0-9 Units Subcutaneous 6 times per day Dianne Dun, NP   2 Units at 12/10/14 0039  . iohexol (OMNIPAQUE) 300 MG/ML solution 20 mL  20 mL Other Once PRN Medication Radiologist, MD   20 mL at 12/04/14 1617  . ipratropium-albuterol (DUONEB) 0.5-2.5 (3) MG/3ML nebulizer solution 3 mL  3 mL Nebulization BID Michael Boston, MD   3 mL at 12/09/14 2136  . lidocaine (XYLOCAINE) 2 % jelly 1 application  1 application Topical Once PRN Izora Gala, MD      . lidocaine (XYLOCAINE) 4 % external solution 0-50 mL  0-50 mL Topical Once PRN Izora Gala, MD      . lip balm (CARMEX) ointment 1 application  1 application Topical BID Michael Boston, MD   1 application at 63/87/56 2214  . loperamide (IMODIUM) capsule 2-4 mg  2-4 mg Oral Once Michael Boston, MD   2 mg at 12/08/14 0800  . menthol-cetylpyridinium (CEPACOL) lozenge 3 mg  1 lozenge Oral PRN Michael Boston, MD      . metoprolol (LOPRESSOR) injection 5 mg  5 mg Intravenous Q4H PRN Domenic Polite, MD   5 mg at 12/09/14 2326  . mupirocin ointment (BACTROBAN) 2 % 1 application  1 application Nasal BID Michael Boston, MD   1 application at 43/32/95 2214  . ondansetron (ZOFRAN) injection 4 mg  4 mg  Intravenous Q6H PRN Michael Boston, MD      . oxymetazoline (AFRIN) 0.05 % nasal spray 1 spray  1 spray Each Nare Once PRN Izora Gala, MD      . pantoprazole sodium (PROTONIX) 40 mg/20 mL oral suspension 80 mg  80 mg Per Tube Daily Michael Boston, MD   80 mg at 12/09/14 1004  . phenol (CHLORASEPTIC) mouth spray 2 spray  2 spray Mouth/Throat PRN Michael Boston, MD      . pneumococcal 23 valent vaccine (PNU-IMMUNE) injection 0.5 mL  0.5 mL Intramuscular Tomorrow-1000 Michael Boston, MD   0.5 mL at 12/08/14 1000  . polyvinyl alcohol (LIQUIFILM TEARS) 1.4 % ophthalmic solution 1 drop  1 drop Both Eyes PRN Michael Boston, MD      . potassium chloride SA (K-DUR,KLOR-CON) CR tablet 20 mEq  20 mEq Oral Q4H PRN Michael Boston, MD      . Derrill Memo ON 12/13/2014] predniSONE (DELTASONE) tablet 5 mg  5 mg Oral Q breakfast Theodis Blaze, MD      . promethazine (PHENERGAN) injection 6.25-12.5 mg  6.25-12.5 mg Intravenous Q4H PRN Michael Boston, MD      . saccharomyces boulardii (FLORASTOR) capsule 250 mg  250 mg Oral BID Michael Boston, MD   250 mg at 12/09/14 2210  . sodium chloride 0.9 % injection 3 mL  3 mL Intravenous Q12H Michael Boston, MD   3 mL at 12/09/14 2214  . sodium chloride 0.9 % injection 3 mL  3 mL Intravenous PRN Michael Boston, MD      . sodium chloride 0.9 % injection 3  mL  3 mL Intravenous Q12H Michael Boston, MD   3 mL at 12/09/14 2214  . sodium chloride 0.9 % injection 3 mL  3 mL Intravenous PRN Michael Boston, MD      . sodium chloride 0.9 % injection 3 mL  3 mL Intravenous Q12H Michael Boston, MD   3 mL at 12/09/14 2214  . sodium chloride 0.9 % injection 3 mL  3 mL Intravenous PRN Michael Boston, MD      . traMADol Veatrice Bourbon) tablet 50-100 mg  50-100 mg Oral Q6H PRN Michael Boston, MD   50 mg at 12/08/14 2323     Allergies  Allergen Reactions  . Coumadin [Warfarin Sodium] Other (See Comments)    Rectal bleeding   . Sulfa Antibiotics Rash    Disposition: 02-Short Term Hospital  Discharge Instructions    Call MD  for:  extreme fatigue    Complete by:  As directed      Call MD for:  extreme fatigue    Complete by:  As directed      Call MD for:  extreme fatigue    Complete by:  As directed      Call MD for:  hives    Complete by:  As directed      Call MD for:  hives    Complete by:  As directed      Call MD for:  hives    Complete by:  As directed      Call MD for:  persistant nausea and vomiting    Complete by:  As directed      Call MD for:  persistant nausea and vomiting    Complete by:  As directed      Call MD for:  persistant nausea and vomiting    Complete by:  As directed      Call MD for:  redness, tenderness, or signs of infection (pain, swelling, redness, odor or green/yellow discharge around incision site)    Complete by:  As directed      Call MD for:  redness, tenderness, or signs of infection (pain, swelling, redness, odor or green/yellow discharge around incision site)    Complete by:  As directed      Call MD for:  redness, tenderness, or signs of infection (pain, swelling, redness, odor or green/yellow discharge around incision site)    Complete by:  As directed      Call MD for:  severe uncontrolled pain    Complete by:  As directed      Call MD for:  severe uncontrolled pain    Complete by:  As directed      Call MD for:  severe uncontrolled pain    Complete by:  As directed      Call MD for:    Complete by:  As directed   Temperature > 101.51F     Call MD for:    Complete by:  As directed   Temperature > 101.51F     Call MD for:    Complete by:  As directed   Temperature > 101.51F     Diet - low sodium heart healthy    Complete by:  As directed      Diet - low sodium heart healthy    Complete by:  As directed      Discharge instructions    Complete by:  As directed   Please see discharge instruction sheets.  Also refer to handout given  an office.  Please call our office if you have any questions or concerns [phone]     Discharge instructions    Complete  by:  As directed   Please see discharge instruction sheets.  Also refer to handout given an office.  Please call our office if you have any questions or concerns [phone]     Discharge instructions    Complete by:  As directed   Please see discharge instruction sheets.  Also refer to handout given an office.  Please call our office if you have any questions or concerns [phone]     Discharge wound care:    Complete by:  As directed   If you have closed incisions, shower and bathe over these incisions with soap and water every day.  Remove all surgical dressings on postoperative day #3.  You do not need to replace dressings over the closed incisions unless you feel more comfortable with a Band-Aid covering it.   If you have an open wound that requires packing, please see wound care instructions.  In general, remove all dressings, wash wound with soap and water and then replace with saline moistened gauze.  Do the dressing change at least every day.  Please call our office [phone] if you have further questions.     Discharge wound care:    Complete by:  As directed   If you have closed incisions, shower and bathe over these incisions with soap and water every day.  Remove all surgical dressings on postoperative day #3.  You do not need to replace dressings over the closed incisions unless you feel more comfortable with a Band-Aid covering it.   If you have an open wound that requires packing, please see wound care instructions.  In general, remove all dressings, wash wound with soap and water and then replace with saline moistened gauze.  Do the dressing change at least every day.  Please call our office [phone] if you have further questions.     Discharge wound care:    Complete by:  As directed   If you have closed incisions, shower and bathe over these incisions with soap and water every day.  Remove all surgical dressings on postoperative day #3.  You do not need to replace  dressings over the closed incisions unless you feel more comfortable with a Band-Aid covering it.   If you have an open wound that requires packing, please see wound care instructions.  In general, remove all dressings, wash wound with soap and water and then replace with saline moistened gauze.  Do the dressing change at least every day.  Please call our office [phone] if you have further questions.     Driving Restrictions    Complete by:  As directed   No driving until off narcotics and can safely swerve away without pain during an emergency     Driving Restrictions    Complete by:  As directed   No driving until off narcotics and can safely swerve away without pain during an emergency     Driving Restrictions    Complete by:  As directed   No driving until off narcotics and can safely swerve away without pain during an emergency     Increase activity slowly    Complete by:  As directed   Walk an hour a day.  Use 20-30 minute walks.  When you can walk 30 minutes without difficulty, it is fine to restart low impact/moderate  activities such as biking, jogging, swimming, sexual activity, etc.  Eventually you can increase to unrestricted activity when not feeling pain.  If you feel pain: STOP!Marland Kitchen   Let pain protect you from overdoing it.  Use ice/heat & over-the-counter pain medications to help minimize soreness.  If that is not enough, then use your narcotic pain prescription as needed to remain active.  It is better to take extra pain medications and be more active than to stay bedridden to avoid all pain medications.     Increase activity slowly    Complete by:  As directed   Walk an hour a day.  Use 20-30 minute walks.  When you can walk 30 minutes without difficulty, it is fine to restart low impact/moderate activities such as biking, jogging, swimming, sexual activity, etc.  Eventually you can increase to unrestricted activity when not feeling pain.  If you feel pain: STOP!Marland Kitchen   Let pain  protect you from overdoing it.  Use ice/heat & over-the-counter pain medications to help minimize soreness.  If that is not enough, then use your narcotic pain prescription as needed to remain active.  It is better to take extra pain medications and be more active than to stay bedridden to avoid all pain medications.     Increase activity slowly    Complete by:  As directed   Walk an hour a day.  Use 20-30 minute walks.  When you can walk 30 minutes without difficulty, it is fine to restart low impact/moderate activities such as biking, jogging, swimming, sexual activity, etc.  Eventually you can increase to unrestricted activity when not feeling pain.  If you feel pain: STOP!Marland Kitchen   Let pain protect you from overdoing it.  Use ice/heat & over-the-counter pain medications to help minimize soreness.  If that is not enough, then use your narcotic pain prescription as needed to remain active.  It is better to take extra pain medications and be more active than to stay bedridden to avoid all pain medications.     Lifting restrictions    Complete by:  As directed   Avoid heavy lifting initially.  Do not push through pain.  You have no specific weight limit - if it hurts to do, DON'T DO IT.   If you feel no pain, you are not injuring anything.  Pain will protect you from injury.  Coughing and sneezing are far more stressful to your incision than any lifting.  Avoid resuming heavy lifting / intense activity until off all narcotic pain medications.  When ready to exercise more, give yourself 2 weeks to gradually get back to full intense exercise/activity.     Lifting restrictions    Complete by:  As directed   Avoid heavy lifting initially.  Do not push through pain.  You have no specific weight limit - if it hurts to do, DON'T DO IT.   If you feel no pain, you are not injuring anything.  Pain will protect you from injury.  Coughing and sneezing are far more stressful to your incision than any lifting.  Avoid resuming  heavy lifting / intense activity until off all narcotic pain medications.  When ready to exercise more, give yourself 2 weeks to gradually get back to full intense exercise/activity.     Lifting restrictions    Complete by:  As directed   Avoid heavy lifting initially.  Do not push through pain.  You have no specific weight limit - if it hurts to do, DON'T  DO IT.   If you feel no pain, you are not injuring anything.  Pain will protect you from injury.  Coughing and sneezing are far more stressful to your incision than any lifting.  Avoid resuming heavy lifting / intense activity until off all narcotic pain medications.  When ready to exercise more, give yourself 2 weeks to gradually get back to full intense exercise/activity.     May shower / Bathe    Complete by:  As directed      May shower / Bathe    Complete by:  As directed      May shower / Bathe    Complete by:  As directed      May walk up steps    Complete by:  As directed      May walk up steps    Complete by:  As directed      May walk up steps    Complete by:  As directed      Sexual Activity Restrictions    Complete by:  As directed   Sexual activity as tolerated.  Do not push through pain.  Pain will protect you from injury.     Sexual Activity Restrictions    Complete by:  As directed   Sexual activity as tolerated.  Do not push through pain.  Pain will protect you from injury.     Sexual Activity Restrictions    Complete by:  As directed   Sexual activity as tolerated.  Do not push through pain.  Pain will protect you from injury.     Walk with assistance    Complete by:  As directed   Walk over an hour a day.  May use a walker/cane/companion to help with balance and stamina.     Walk with assistance    Complete by:  As directed   Walk over an hour a day.  May use a walker/cane/companion to help with balance and stamina.     Walk with assistance    Complete by:  As directed   Walk over an hour a day.  May use a  walker/cane/companion to help with balance and stamina.            Medication List    STOP taking these medications        metroNIDAZOLE 500 MG tablet  Commonly known as:  FLAGYL     neomycin 500 MG tablet  Commonly known as:  MYCIFRADIN      TAKE these medications        acetaminophen 325 MG tablet  Commonly known as:  TYLENOL  Take 650 mg by mouth 2 (two) times daily.     atorvastatin 20 MG tablet  Commonly known as:  LIPITOR  Take 20 mg by mouth at bedtime.     azithromycin 250 MG tablet  Commonly known as:  ZITHROMAX  Take 250 mg by mouth daily. For chronic obstructive pulmonary disease     bisacodyl 5 MG EC tablet  Commonly known as:  DULCOLAX  Take 5 mg by mouth 2 (two) times daily.     cholecalciferol 1000 UNITS tablet  Commonly known as:  VITAMIN D  Take 2,000 Units by mouth daily.     cycloSPORINE 0.05 % ophthalmic emulsion  Commonly known as:  RESTASIS  Place 1 drop into both eyes 2 (two) times daily.     erythromycin ophthalmic ointment  Place 1 application into both eyes at bedtime.  hydrocortisone 2.5 % rectal cream  Commonly known as:  ANUSOL-HC  Place 1 application rectally as needed for hemorrhoids or itching.     hydroxypropyl methylcellulose / hypromellose 2.5 % ophthalmic solution  Commonly known as:  ISOPTO TEARS / GONIOVISC  Place 1 drop into both eyes as needed for dry eyes.     ipratropium-albuterol 0.5-2.5 (3) MG/3ML Soln  Commonly known as:  DUONEB  Take 3 mLs by nebulization 3 (three) times daily.     LUBRICANT EYE DROPS 0.4-0.3 % Soln  Generic drug:  Polyethyl Glycol-Propyl Glycol  Apply 1 drop to eye as needed (for dry eyes).     omeprazole 20 MG capsule  Commonly known as:  PRILOSEC  Take 20 mg by mouth daily.     polyethylene glycol packet  Commonly known as:  MIRALAX / GLYCOLAX  Take 17 g by mouth daily.     predniSONE 5 MG tablet  Commonly known as:  DELTASONE  Take 5 mg by mouth daily with breakfast.      sotalol 80 MG tablet  Commonly known as:  BETAPACE  Take 80 mg by mouth 2 (two) times daily.     traMADol 50 MG tablet  Commonly known as:  ULTRAM  Take 1-2 tablets (50-100 mg total) by mouth every 6 (six) hours as needed for moderate pain or severe pain.     vitamin C 1000 MG tablet  Take 1,000 mg by mouth 2 (two) times daily.           Follow-up Information    Follow up with Camarie Mctigue C., MD. Schedule an appointment as soon as possible for a visit in 2 weeks.   Specialty:  General Surgery   Why:  To follow up after your operation, To follow up after your hospital stay   Contact information:   [address] [place] [id number] [phone]        Signed: Morton Peters, M.D., F.A.C.S. Gastrointestinal and Minimally Invasive Surgery [facility] [address], [place], [place] [address] [phone] Main / Paging   12/04/2014, 11:54 AM

## 2014-12-04 NOTE — Procedures (Signed)
Successful fluoroscopic guided insertion of gastrostomy tube.   The gastrostomy tube may be used immediately for medications.   Tube feeds may be initiated in 24 hours as per the primary team.   No immediate post procedural complications.   Jay Easter Schinke, MD Pager #: [phone]  

## 2014-12-04 NOTE — Progress Notes (Signed)
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  Barium swallow confirms this.    -ENT eval confirms.  Agree w Bevely Palmer.   D/w pt & her son.    Pt needs gastric feeding tube to help reliably get nutrition & meds enterally.  I suspect dysphagia will be a permanent problem & will not fully recover - but could improve w ENT/SLP help.  Pt has friend w G tube that is active/functional, and she & her son are very open to the idea of a G tube.  Patient is still somewhat active & independent so they remain reasonably aggressive.    She dislikes the idea of an NGT or ND feeding tube.  I agree that nasal tubes are a poor long term solution.    I called [facility] yesterday AM to do EGD to evaluate esophagus & stomach & then do PEG by EGD as well.   Dr Paulita Fujita to d/w Dr Penelope Coop.    Need input from GI 1st though.  Tried to reach Dr Penelope Coop this AM.  No luck yet.  Hopefully soon . -ileus resolved from colectomy/colovaginal fistula repair - adv diet when safe to eat  -pathology benign - d/w pt/son & copy of report in the room  -hypoK - replacing  -hypoMag -  replaced  -IVFs  -changed narcotics w bad dreams & mild grogginess - MS better  -inc HR controlled w IV metoprolol  -HTN control - challenge w NPO.    -chronic brochiectasis - give IV Ceftazidime w NPO & cannot take usual Azithromycin & h/o Pseudomonas bronchiectasis with WBC increasing   -anemia stable  -IV steroid coverage baseline for now until can take PO  -VTE prophylaxis- SCDs, etc  -mobilize as tolerated to help recovery  I updated the patient's status to the patient, her son ,  & her RN.  Recommendations were made.  Questions were answered.  The patient & RN expressed understanding & appreciation.   Adin Hector, M.D., F.A.C.S. Gastrointestinal and Minimally Invasive Surgery [facility] [address], [place], [place] [address] [phone] Main / Paging   12/04/2014  Subjective:  Had bad dream & had inc HR Walking more C/o dry mouth Wants to eat  Objective:  Vital signs:  Filed Vitals:   12/03/14 1441 12/03/14 2000 12/03/14 2229 12/04/14 0435  BP: 139/82  162/77 110/94  Pulse: 83  83 93  Temp: 98.3 F (36.8 C)  98.4 F (36.9 C) 97.6 F (36.4 C)  TempSrc: Oral  Oral Oral  Resp: 16  16 16   Height:      Weight:    41.5 kg (91 lb 7.9 oz)  SpO2: 97% 94% 96% 92%    Last BM Date:  12/01/14  Intake/Output   Yesterday:  10/12 0701 - 10/13 0700 In: 1835 [I.V.:1735; IV Piggyback:100] Out: 1250 [Urine:1250] This shift:     Bowel function:  Flatus: y  BM: n  Drain: n/a  Physical Exam:  General: Pt awake/alert/oriented x4 in no acute distress Eyes: PERRL, normal EOM.  Sclera clear.  No icterus Neuro: CN II-XII intact w/o focal sensory/motor deficits. Lymph: No head/neck/groin lymphadenopathy Psych:  No delerium/psychosis/paranoia HENT: Normocephalic, Mucus membranes dry.  No thrush.  Remains HOH Neck: Supple, No tracheal deviation Chest: No chest wall pain w good excursion CV:  Pulses intact.  Regular  rhythm MS: Normal AROM mjr joints.  No obvious deformity Abdomen: Soft.  Nondistended.  Mildly tender at incisions only.  No evidence of peritonitis.  No incarcerated hernias. Ext:  SCDs BLE.  No mjr edema.  No cyanosis Skin: No petechiae / purpura  Results:   Labs: Results for orders placed or performed during the hospital encounter of 11/28/14 (from the past 48 hour(s))  CBC     Status: Abnormal   Collection Time: 12/03/14  5:01 AM  Result Value Ref Range   WBC 7.0 4.0 - 10.5 K/uL   RBC 2.98 (L) 3.87 - 5.11 MIL/uL   Hemoglobin 8.8 (L) 12.0 - 15.0 g/dL   HCT 27.3 (L) 36.0 - 46.0 %   MCV 91.6 78.0 - 100.0 fL   MCH 29.5 26.0 - 34.0 pg   MCHC 32.2 30.0 - 36.0 g/dL   RDW 14.7 11.5 - 15.5 %   Platelets 225 150 - 400 K/uL  Basic metabolic panel     Status: Abnormal   Collection Time: 12/03/14  5:01 AM  Result Value Ref Range   Sodium 139 135 - 145 mmol/L   Potassium 3.0 (L) 3.5 - 5.1 mmol/L   Chloride 105 101 - 111 mmol/L   CO2 26 22 - 32 mmol/L   Glucose, Bld 137 (H) 65 - 99 mg/dL   BUN 8 6 - 20 mg/dL   Creatinine, Ser 0.60 0.44 - 1.00 mg/dL   Calcium 8.1 (L) 8.9 - 10.3 mg/dL   GFR calc non Af Amer >60 >60 mL/min   GFR calc Af Amer >60 >60 mL/min    Comment: (NOTE) The eGFR has been calculated using the CKD EPI equation. This calculation has not been validated in all clinical situations. eGFR's persistently <60 mL/min signify possible Chronic Kidney Disease.    Anion gap 8 5 - 15  Lipase, blood     Status: None   Collection Time: 12/03/14  8:06 AM  Result Value Ref Range   Lipase 33 22 - 51 U/L  Hepatic function panel     Status: Abnormal   Collection Time: 12/03/14  8:06 AM  Result Value Ref Range   Total Protein 6.2 (L) 6.5 - 8.1 g/dL   Albumin 2.7 (L) 3.5 - 5.0 g/dL   AST 33 15 - 41 U/L   ALT 17 14 - 54 U/L   Alkaline Phosphatase 48 38 - 126 U/L   Total Bilirubin 0.9 0.3 - 1.2 mg/dL   Bilirubin, Direct 0.2 0.1 - 0.5 mg/dL   Indirect Bilirubin 0.7 0.3 - 0.9 mg/dL   Prealbumin     Status: Abnormal   Collection Time: 12/03/14  8:06 AM  Result Value Ref Range   Prealbumin 11.6 (L) 18 - 38 mg/dL    Comment: Performed at [facility]  CBC     Status: Abnormal   Collection Time: 12/04/14  5:17 AM  Result Value Ref Range   WBC 6.6 4.0 - 10.5 K/uL   RBC 3.20 (L) 3.87 - 5.11 MIL/uL   Hemoglobin 9.5 (L) 12.0 - 15.0 g/dL   HCT 29.3 (L) 36.0 - 46.0 %   MCV 91.6 78.0 - 100.0 fL   MCH 29.7 26.0 - 34.0 pg   MCHC 32.4 30.0 - 36.0 g/dL   RDW 14.8 11.5 - 15.5 %   Platelets 236 150 - 400 K/uL  Basic metabolic panel     Status: Abnormal   Collection Time: 12/04/14  5:17 AM  Result Value Ref Range   Sodium 135 135 - 145 mmol/L   Potassium 3.6 3.5 - 5.1 mmol/L   Chloride 101 101 - 111 mmol/L   CO2 28 22 - 32 mmol/L   Glucose, Bld 116 (H) 65 - 99 mg/dL   BUN <5 (L) 6 - 20 mg/dL   Creatinine, Ser 0.58 0.44 - 1.00 mg/dL   Calcium 8.0 (L) 8.9 - 10.3 mg/dL   GFR calc non Af Amer >60 >60 mL/min   GFR calc Af Amer >60 >60 mL/min    Comment: (NOTE) The eGFR has been calculated using the CKD EPI equation. This calculation has not been validated in all clinical situations. eGFR's persistently <60 mL/min signify possible Chronic Kidney Disease.    Anion gap 6 5 - 15  Magnesium     Status: None   Collection Time: 12/04/14  5:17 AM  Result Value Ref Range   Magnesium 2.4 1.7 - 2.4 mg/dL    Imaging / Studies: Dg Chest 2 View  12/02/2014  CLINICAL DATA:  Dysphasia.  Shortness of breath. EXAM: CHEST  2 VIEW COMPARISON:  None. FINDINGS: Mediastinum and hilar structures are normal. Cardiomegaly with mild pulmonary vascular prominence and interstitial prominence with small pleural effusions. These findings suggest mild congestive heart failure. Bibasilar subsegmental atelectasis and/or scarring. No acute bony abnormality. IMPRESSION: Cardiomegaly with mild pulmonary vascular prominence and interstitial prominence with small bilateral pleural effusions consistent  with mild congestive heart failure. Electronically Signed   By: Marcello Moores  Register   On: 12/02/2014 09:56   Dg Esophagus  12/02/2014  CLINICAL DATA:  Dysphagia. Aspiration on swallowing function study yesterday. History of aspiration pneumonia. EXAM: ESOPHOGRAM/BARIUM SWALLOW TECHNIQUE: Single contrast examination was performed using thin and thick barium. FLUOROSCOPY TIME:  Radiation Exposure Index (as provided by the fluoroscopic device): If the device does not provide the exposure index: Fluoroscopy Time:  2 minutes 5 seconds Number of Acquired Images: COMPARISON:  None. FINDINGS: Abnormal pharyngeal phase of swallowing. There was pooling of thick and thin barium in the valleculae and piriform sinuses. The patient had difficulty coordinating swallowing. Patient had difficulty moving the bolus into the esophagus. Initial swallows with thin barium revealed penetration without aspiration. Additional thick barium swallows progressed to frank aspiration of a mild amount. The esophageal motility is diminished. No stricture or mass. Limited evaluation of the esophagus due to limited barium in the esophagus. IMPRESSION: Poor esophageal motility with difficulty propelling the bolus into the esophagus. There is pooling in the pharynx. There was aspiration of thick barium. There was penetration with thin barium. Limited evaluation the esophagus due to small boluses. There is esophageal dysmotility. No stricture or obstruction of the esophagus. Electronically Signed   By: Franchot Gallo M.D.   On: 12/02/2014 11:40    Medications / Allergies: per chart  Antibiotics: Anti-infectives    Start     Dose/Rate Route Frequency Ordered Stop  12/01/14 1500  cefTAZidime (FORTAZ) 1 g in dextrose 5 % 50 mL IVPB    Comments:  When taking PO well, switch to home azithromycin 242m PO daily   1 g 100 mL/hr over 30 Minutes Intravenous Every 12 hours 12/01/14 1332     11/28/14 2300  cefoTEtan (CEFOTAN) 2 g in dextrose 5 % 50 mL  IVPB     2 g 100 mL/hr over 30 Minutes Intravenous Every 12 hours 11/28/14 1736 11/28/14 2330   11/28/14 1532  clindamycin (CLEOCIN) 900 mg, gentamicin (GARAMYCIN) 240 mg in sodium chloride 0.9 % 1,000 mL for intraperitoneal lavage  Status:  Discontinued       As needed 11/28/14 1532 11/28/14 1535   11/28/14 1006  cefoTEtan (CEFOTAN) 2 g in dextrose 5 % 50 mL IVPB     2 g 100 mL/hr over 30 Minutes Intravenous On call to O.R. 11/28/14 1006 11/28/14 1305   11/28/14 0600  clindamycin (CLEOCIN) 900 mg, gentamicin (GARAMYCIN) 240 mg in sodium chloride 0.9 % 1,000 mL for intraperitoneal lavage  Status:  Discontinued    Comments:  Pharmacy may adjust dosing strength, schedule, rate of infusion, etc as needed to optimize therapy    Intraperitoneal To Surgery 11/27/14 1323 11/28/14 1643        Note: Portions of this report may have been transcribed using voice recognition software. Every effort was made to ensure accuracy; however, inadvertent computerized transcription errors may be present.   Any transcriptional errors that result from this process are unintentional.     SAdin Hector M.D., F.A.C.S. Gastrointestinal and Minimally Invasive Surgery [facility] [address] [place] [phone] / Paging   12/04/2014  CARE TEAM:  PCP: PWillodean Rosenthal MD  Outpatient Care Team: Patient Care Team: JWillodean Rosenthal MD as PCP - General (Internal Medicine) SBjorn Loser MD as Consulting Physician (Urology) SMichael Boston MD as Consulting Physician (General Surgery) JTeena Irani MD as Consulting Physician (Gastroenterology)  Inpatient Treatment Team: Treatment Team: Attending Provider: SMichael Boston MD; Registered Nurse: LBailey Mech RN; Consulting Physician: JIzora Gala MD; Registered Nurse: LJoellen Jersey RN; Technician: KCarlisle Beers NT; Registered Nurse: FEli Hose RN; Rounding Team: WGarner Gavel  MD; Consulting Physician: SWonda Horner MD; Technician: DAnnette Stable NT; Technician: JGeoffery Spruce NT; Technician: RMilagros Reap NT; Registered Nurse: LLucky Rathke RN; Registered Nurse: EHollace Hayward RN

## 2014-12-04 NOTE — Discharge Instructions (Signed)
SURGERY: POST OP INSTRUCTIONS °(Surgery for small bowel obstruction, colon resection, etc)  ° °1. DIET: Follow a light bland diet the first 24 hours after arrival home, such as soup, liquids, crackers, etc.  Be sure to include lots of fluids daily.  Avoid fast food or heavy meals as your are more likely to get nauseated.  Stay on a low fat diet the next few days after surgery.  Gradually add a fiber supplement to your diet over the next week.   Your should try to eat a low-fat, high fiber diet the rest of your life thereafter (See Below). °  °2. Take your usually prescribed home medications unless otherwise directed.  OK to take aspirin.    If you are on strong blood thinners (warfarin/Coumadin, Plavix, Xerelto, Eliquis, etc), discuss with your surgeon, medicine PCP, and/or cardiologist for instructions on when to restart the blood thinner & if blood monitoring is needed (PT/INR blood check, etc).  Usually you can restart any strong blood thinners after the second postoperative day. ° °3. PAIN CONTROL: ° °Pain after surgery or related to activity is often due to strain/injury to muscle, tendon, nerves and/or incisions.  This pain is usually short-term and will improve in a few months.  ° °Many people find it helpful to do the following things TOGETHER to help speed the process of healing and to get back to regular activity more quickly: ° °1. Avoid heavy physical activity at first °a. No lifting greater than 20 pounds at first, then increase to lifting as tolerated over the next few weeks °b. Do not “push through” the pain.  Listen to your body and avoid positions and maneuvers than reproduce the pain.  Wait a few days before trying something more intense °c. Walking is okay as tolerated, but go slowly and stop when getting sore.  If you can walk 30 minutes without stopping or pain, you can try more intense activity (running, jogging, aerobics, cycling, swimming, treadmill, sex, sports, weightlifting, etc  ) °d. Remember: If it hurts to do it, then don’t do it! ° °2. Take Anti-inflammatory medication °a. Choose ONE of the following over-the-counter medications: °i.            Acetaminophen 500mg tabs (Tylenol) 1-2 pills with every meal and just before bedtime (avoid if you have liver problems) °ii.            Naproxen 220mg tabs (ex. Aleve) 1-2 pills twice a day (avoid if you have kidney, stomach, IBD, or bleeding problems) °iii. Ibuprofen 200mg tabs (ex. Advil, Motrin) 3-4 pills with every meal and just before bedtime (avoid if you have kidney, stomach, IBD, or bleeding problems) °b. Take with food/snack around the clock for 1-2 weeks °i. This helps the muscle and nerve tissues become less irritable and calm down faster ° °3. Use a Heating pad or Ice/Cold Pack °a. Most patients will experience some swelling and bruising around the incisions.  Swelling and bruising can take several weeks to resolve. °i. Ice packs or heating pads (30-60 minutes up to 6 times a day) will help. °ii. Use ice for the first few days to help decrease swelling and bruising °iii. Switch to heat to help relax tight/sore spots and speed recovery.  °iv. Some people prefer to use ice alone, heat alone, alternating between ice & heat.  Experiment to what works for you °a. May use warm bath/hottub  or showers ° °4. Try Gentle Massage and/or Stretching  °a. at the area of   pain many times a day °b. stop if you feel pain - do not overdo it °5. Prescription for pain medication (such as oxycodone, hydrocodone, etc) should be given to you upon discharge.  Take your pain medication as prescribed. °a. If you are having problems/concerns with the prescription medicine (does not control pain, nausea, vomiting, rash, itching, etc), please call us [phone] to see if we need to switch you to a different pain medicine that will work better for you and/or control your side effect better. °b. If you need a refill on your pain medication, please contact your  pharmacy.  They will contact our office to request authorization. Prescriptions will not be filled after 5 pm or on week-ends. °c.  °Try these steps together to help you body heal faster and avoid making things get worse.  Doing just one of these things may not be enough.   ° °If you are not getting better after two weeks or are noticing you are getting worse, contact our office for further advice; we may need to re-evaluate you & see what other things we can do to help. ° ° °GETTING TO GOOD BOWEL HEALTH. °Irregular bowel habits such as constipation and diarrhea can lead to many problems over time.  Having one soft bowel movement a day is the most important way to prevent further problems.  The anorectal canal is designed to handle stretching and feces to safely manage our ability to get rid of solid waste (feces, poop, stool) out of our body.  BUT, hard constipated stools can act like ripping concrete bricks and diarrhea can be a burning fire to this very sensitive area of our body, causing inflamed hemorrhoids, anal fissures, increasing risk is perirectal abscesses, abdominal pain/bloating, an making irritable bowel worse.     ° °The goal: ONE SOFT BOWEL MOVEMENT A DAY!  To have soft, regular bowel movements:  °• Drink plenty of fluids, consider 4-6 tall glasses of water a day.   °• Take plenty of fiber.  Fiber is the undigested part of plant food that passes into the colon, acting s “natures broom” to encourage bowel motility and movement.  Fiber can absorb and hold large amounts of water. This results in a larger, bulkier stool, which is soft and easier to pass. Work gradually over several weeks up to 6 servings a day of fiber (25g a day even more if needed) in the form of: °o Vegetables -- Root (potatoes, carrots, turnips), leafy green (lettuce, salad greens, celery, spinach), or cooked high residue (cabbage, broccoli, etc) °o Fruit -- Fresh (unpeeled skin & pulp), Dried (prunes, apricots, cherries, etc ),  or  stewed ( applesauce)  °o Whole grain breads, pasta, etc (whole wheat)  °o Bran cereals  °• Bulking Agents -- This type of water-retaining fiber generally is easily obtained each day by one of the following:  °o Psyllium bran -- The psyllium plant is remarkable because its ground seeds can retain so much water. This product is available as Metamucil, Konsyl, Effersyllium, Per Diem Fiber, or the less expensive generic preparation in drug and health food stores. Although labeled a laxative, it really is not a laxative.  °o Methylcellulose -- This is another fiber derived from wood which also retains water. It is available as Citrucel. °o Polyethylene Glycol - and “artificial” fiber commonly called Miralax or Glycolax.  It is helpful for people with gassy or bloated feelings with regular fiber °o Flax Seed - a less gassy fiber than   psyllium °• No reading or other relaxing activity while on the toilet. If bowel movements take longer than 5 minutes, you are too constipated °• AVOID CONSTIPATION.  High fiber and water intake usually takes care of this.  Sometimes a laxative is needed to stimulate more frequent bowel movements, but  °• Laxatives are not a good long-term solution as it can wear the colon out.  They can help jump-start bowels if constipated, but should be relied on constantly without discussing with your doctor °o Osmotics (Milk of Magnesia, Fleets phosphosoda, Magnesium citrate, MiraLax, GoLytely) are safer than  °o Stimulants (Senokot, Castor Oil, Dulcolax, Ex Lax)    °o Avoid taking laxatives for more than 7 days in a row. °•  IF SEVERELY CONSTIPATED, try a Bowel Retraining Program: °o Do not use laxatives.  °o Eat a diet high in roughage, such as bran cereals and leafy vegetables.  °o Drink six (6) ounces of prune or apricot juice each morning.  °o Eat two (2) large servings of stewed fruit each day.  °o Take one (1) heaping tablespoon of a psyllium-based bulking agent twice a day. Use sugar-free  sweetener when possible to avoid excessive calories.  °o Eat a normal breakfast.  °o Set aside 15 minutes after breakfast to sit on the toilet, but do not strain to have a bowel movement.  °o If you do not have a bowel movement by the third day, use an enema and repeat the above steps.  °• Controlling diarrhea °o Switch to liquids and simpler foods for a few days to avoid stressing your intestines further. °o Avoid dairy products (especially milk & ice cream) for a short time.  The intestines often can lose the ability to digest lactose when stressed. °o Avoid foods that cause gassiness or bloating.  Typical foods include beans and other legumes, cabbage, broccoli, and dairy foods.  Every person has some sensitivity to other foods, so listen to our body and avoid those foods that trigger problems for you. °o Adding fiber (Citrucel, Metamucil, psyllium, Miralax) gradually can help thicken stools by absorbing excess fluid and retrain the intestines to act more normally.  Slowly increase the dose over a few weeks.  Too much fiber too soon can backfire and cause cramping & bloating. °o Probiotics (such as active yogurt, Align, etc) may help repopulate the intestines and colon with normal bacteria and calm down a sensitive digestive tract.  Most studies show it to be of mild help, though, and such products can be costly. °o Medicines: °- Bismuth subsalicylate (ex. Kayopectate, Pepto Bismol) every 30 minutes for up to 6 doses can help control diarrhea.  Avoid if pregnant. °- Loperamide (Immodium) can slow down diarrhea.  Start with two tablets (4mg total) first and then try one tablet every 6 hours.  Avoid if you are having fevers or severe pain.  If you are not better or start feeling worse, stop all medicines and call your doctor for advice °o Call your doctor if you are getting worse or not better.  Sometimes further testing (cultures, endoscopy, X-ray studies, bloodwork, etc) may be needed to help diagnose and treat  the cause of the diarrhea. ° °TROUBLESHOOTING IRREGULAR BOWELS °1) Avoid extremes of bowel movements (no bad constipation/diarrhea) °2) Miralax 17gm mixed in 8oz. water or juice-daily. May use BID as needed.  °3) Gas-x,Phazyme, etc. as needed for gas & bloating.  °4) Soft,bland diet. No spicy,greasy,fried foods.  °5) Prilosec over-the-counter as needed  °6) May hold   gluten/wheat products from diet to see if symptoms improve.  7)  May try probiotics (Align, Activa, etc) to help calm the bowels down 7) If symptoms become worse call back immediately.   4. Wash / shower every day.  You may shower over the incision / wound.  Avoid baths until 5 days after surgery.  Continue to shower over incision(s) after the dressing is off.  5. Remove your waterproof bandages 5 days after surgery.  You may leave the incision open to air.  Remove any wicks or ribbons in your wound.  If you have an open wound, please see wound care instructions. You may replace a dressing/Band-Aid to cover the incision for comfort if you wish.  6. ACTIVITIES as tolerated:   a. You may resume regular (light) daily activities beginning the next day--such as daily self-care, walking, climbing stairs--gradually increasing activities as tolerated.  If you can walk 30 minutes without difficulty, it is safe to try more intense activity such as jogging, treadmill, bicycling, low-impact aerobics, swimming, etc. b. Save the most intensive and strenuous activity for last (Usually 3-6 weeks after surgery) such as sit-ups, heavy lifting, contact sports, etc  Refrain from any heavy lifting or straining until you are off narcotics for pain control.   c. DO NOT PUSH THROUGH PAIN.  Let pain be your guide: If it hurts to do something, don't do it.  Pain is your body warning you to avoid that activity for another week until the pain goes down. d. You may drive when you are no longer taking prescription pain medication, you can comfortably wear a seatbelt, and  you can safely maneuver your car and apply brakes. e. Dennis Bast may have sexual intercourse when it is comfortable. If it hurts to do something, don't do it.  7. FOLLOW UP in our office a. Please call [facility] at [phone] to set up an appointment to see your surgeon in the office for a follow-up appointment approximately 2-3 weeks after your surgery. b. Make sure that you call for this appointment the day you arrive home to insure a convenient appointment time.  8. IF YOU HAVE DISABILITY OR FAMILY LEAVE FORMS, BRING THEM TO THE OFFICE FOR PROCESSING.  DO NOT GIVE THEM TO YOUR DOCTOR.   WHEN TO CALL us [phone]: 1. Poor pain control 2. Reactions / problems with new medications (rash/itching, nausea, etc)  3. Fever over 101.5 F (38.5 C) 4. Inability to urinate 5. Nausea and/or vomiting 6. Worsening swelling or bruising 7. Continued bleeding from incision. 8. Increased pain, redness, or drainage from the incision  The clinic staff is available to answer your questions during regular business hours (8:30am-5pm).  Please dont hesitate to call and ask to speak to one of our nurses for clinical concerns.   A surgeon from [facility] is always on call at the hospitals   If you have a medical emergency, go to the nearest emergency room or call 911.    [facility], [place], [place], [place], [place]  [address] ? MAIN: [phone]) [phone] ? TOLL FREE: [phone] ? FAX [phone] [email or web address]   GETTING TO GOOD BOWEL HEALTH. Irregular bowel habits such as constipation and diarrhea can lead to many problems over time.  Having one soft bowel movement a day is the most important way to prevent further problems.  The anorectal canal is designed to handle stretching and feces to safely manage our ability to get rid of solid  waste (feces, poop, stool) out of our body.  BUT, hard constipated stools can act like ripping concrete bricks and  diarrhea can be a burning fire to this very sensitive area of our body, causing inflamed hemorrhoids, anal fissures, increasing risk is perirectal abscesses, abdominal pain/bloating, an making irritable bowel worse.      The goal: ONE SOFT BOWEL MOVEMENT A DAY!  To have soft, regular bowel movements:   Drink plenty of fluids, consider 4-6 tall glasses of water a day.    Take plenty of fiber.  Fiber is the undigested part of plant food that passes into the colon, acting s natures broom to encourage bowel motility and movement.  Fiber can absorb and hold large amounts of water. This results in a larger, bulkier stool, which is soft and easier to pass. Work gradually over several weeks up to 6 servings a day of fiber (25g a day even more if needed) in the form of: o Vegetables -- Root (potatoes, carrots, turnips), leafy green (lettuce, salad greens, celery, spinach), or cooked high residue (cabbage, broccoli, etc) o Fruit -- Fresh (unpeeled skin & pulp), Dried (prunes, apricots, cherries, etc ),  or stewed ( applesauce)  o Whole grain breads, pasta, etc (whole wheat)  o Bran cereals   Bulking Agents -- This type of water-retaining fiber generally is easily obtained each day by one of the following:  o Psyllium bran -- The psyllium plant is remarkable because its ground seeds can retain so much water. This product is available as Metamucil, Konsyl, Effersyllium, Per Diem Fiber, or the less expensive generic preparation in drug and health food stores. Although labeled a laxative, it really is not a laxative.  o Methylcellulose -- This is another fiber derived from wood which also retains water. It is available as Citrucel. o Polyethylene Glycol - and artificial fiber commonly called Miralax or Glycolax.  It is helpful for people with gassy or bloated feelings with regular fiber o Flax Seed - a less gassy fiber than psyllium  No reading or other relaxing activity while on the toilet. If bowel  movements take longer than 5 minutes, you are too constipated  AVOID CONSTIPATION.  High fiber and water intake usually takes care of this.  Sometimes a laxative is needed to stimulate more frequent bowel movements, but   Laxatives are not a good long-term solution as it can wear the colon out.  They can help jump-start bowels if constipated, but should be relied on constantly without discussing with your doctor o Osmotics (Milk of Magnesia, Fleets phosphosoda, Magnesium citrate, MiraLax, GoLytely) are safer than  o Stimulants (Senokot, Castor Oil, Dulcolax, Ex Lax)    o Avoid taking laxatives for more than 7 days in a row.   IF SEVERELY CONSTIPATED, try a Bowel Retraining Program: o Do not use laxatives.  o Eat a diet high in roughage, such as bran cereals and leafy vegetables.  o Drink six (6) ounces of prune or apricot juice each morning.  o Eat two (2) large servings of stewed fruit each day.  o Take one (1) heaping tablespoon of a psyllium-based bulking agent twice a day. Use sugar-free sweetener when possible to avoid excessive calories.  o Eat a normal breakfast.  o Set aside 15 minutes after breakfast to sit on the toilet, but do not strain to have a bowel movement.  o If you do not have a bowel movement by the third day, use an enema and repeat the above steps.  Controlling diarrhea o Switch to liquids and simpler foods for a few days to avoid stressing your intestines further. o Avoid dairy products (especially milk & ice cream) for a short time.  The intestines often can lose the ability to digest lactose when stressed. o Avoid foods that cause gassiness or bloating.  Typical foods include beans and other legumes, cabbage, broccoli, and dairy foods.  Every person has some sensitivity to other foods, so listen to our body and avoid those foods that trigger problems for you. o Adding fiber (Citrucel, Metamucil, psyllium, Miralax) gradually can help thicken stools by absorbing excess  fluid and retrain the intestines to act more normally.  Slowly increase the dose over a few weeks.  Too much fiber too soon can backfire and cause cramping & bloating. o Probiotics (such as active yogurt, Align, etc) may help repopulate the intestines and colon with normal bacteria and calm down a sensitive digestive tract.  Most studies show it to be of mild help, though, and such products can be costly. o Medicines: - Bismuth subsalicylate (ex. Kayopectate, Pepto Bismol) every 30 minutes for up to 6 doses can help control diarrhea.  Avoid if pregnant. - Loperamide (Immodium) can slow down diarrhea.  Start with two tablets (4mg  total) first and then try one tablet every 6 hours.  Avoid if you are having fevers or severe pain.  If you are not better or start feeling worse, stop all medicines and call your doctor for advice o Call your doctor if you are getting worse or not better.  Sometimes further testing (cultures, endoscopy, X-ray studies, bloodwork, etc) may be needed to help diagnose and treat the cause of the diarrhea.  TROUBLESHOOTING IRREGULAR BOWELS 1) Avoid extremes of bowel movements (no bad constipation/diarrhea) 2) Miralax 17gm mixed in 8oz. water or juice-daily. May use BID as needed.  3) Gas-x,Phazyme, etc. as needed for gas & bloating.  4) Soft,bland diet. No spicy,greasy,fried foods.  5) Prilosec over-the-counter as needed  6) May hold gluten/wheat products from diet to see if symptoms improve.  7)  May try probiotics (Align, Activa, etc) to help calm the bowels down 7) If symptoms become worse call back immediately.  Managing Pain  Pain after surgery or related to activity is often due to strain/injury to muscle, tendon, nerves and/or incisions.  This pain is usually short-term and will improve in a few months.   Many people find it helpful to do the following things TOGETHER to help speed the process of healing and to get back to regular activity more quickly:  6. Avoid  heavy physical activity at first a. No lifting greater than 20 pounds at first, then increase to lifting as tolerated over the next few weeks b. Do not push through the pain.  Listen to your body and avoid positions and maneuvers than reproduce the pain.  Wait a few days before trying something more intense c. Walking is okay as tolerated, but go slowly and stop when getting sore.  If you can walk 30 minutes without stopping or pain, you can try more intense activity (running, jogging, aerobics, cycling, swimming, treadmill, sex, sports, weightlifting, etc ) d. Remember: If it hurts to do it, then dont do it!  7. Take Anti-inflammatory medication a. Choose ONE of the following over-the-counter medications: i.            Acetaminophen 500mg  tabs (Tylenol) 1-2 pills with every meal and just before bedtime (avoid if you have liver problems) ii.  Naproxen  tabs (ex. Aleve) 1-2 pills twice a day (avoid if you have kidney, stomach, IBD, or bleeding problems) iii. Ibuprofen  tabs (ex. Advil, Motrin) 3-4 pills with every meal and just before bedtime (avoid if you have kidney, stomach, IBD, or bleeding problems) b. Take with food/snack around the clock for 1-2 weeks i. This helps the muscle and nerve tissues become less irritable and calm down faster  8. Use a Heating pad or Ice/Cold Pack a. 4-6 times a day b. May use warm bath/hottub  or showers  9. Try Gentle Massage and/or Stretching  a. at the area of pain many times a day b. stop if you feel pain - do not overdo it  Try these steps together to help you body heal faster and avoid making things get worse.  Doing just one of these things may not be enough.    If you are not getting better after two weeks or are noticing you are getting worse, contact our office for further advice; we may need to re-evaluate you & see what other things we can do to help.  Pelvic floor muscle training exercises ("Kegels") can help strengthen  the muscles under the uterus, bladder, and bowel (large intestine). They can help both men and women who have problems with urine leakage or bowel control.  A pelvic floor muscle training exercise is like pretending that you have to urinate, and then holding it. You relax and tighten the muscles that control urine flow. It's important to find the right muscles to tighten.  The next time you have to urinate, start to go and then stop. Feel the muscles in your vagina, bladder, or anus get tight and move up. These are the pelvic floor muscles. If you feel them tighten, you've done the exercise right. If you are still not sure whether you are tightening the right muscles, keep in mind that all of the muscles of the pelvic floor relax and contract at the same time. Because these muscles control the bladder, rectum, and vagina, the following tips may help: Women: Insert a finger into your vagina. Tighten the muscles as if you are holding in your urine, then let go. You should feel the muscles tighten and move up and down.  Men: Insert a finger into your rectum. Tighten the muscles as if you are holding in your urine, then let go. You should feel the muscles tighten and move up and down. These are the same muscles you would tighten if you were trying to prevent yourself from passing gas.  It is very important that you keep the following muscles relaxed while doing pelvic floor muscle training exercises: Abdominal  Buttocks (the deeper, anal sphincter muscle should contract)  Thigh   A woman can also strengthen these muscles by using a vaginal cone, which is a weighted device that is inserted into the vagina. Then you try to tighten the pelvic floor muscles to hold the device in place. If you are unsure whether you are doing the pelvic floor muscle training correctly, you can use biofeedback and electrical stimulation to help find the correct muscle group to work. Biofeedback is a method of positive  reinforcement. Electrodes are placed on the abdomen and along the anal area. Some therapists place a sensor in the vagina in women or anus in men to monitor the contraction of pelvic floor muscles.  A monitor will display a graph showing which muscles are contracting and which are at rest. The therapist  can help find the right muscles for performing pelvic floor muscle training exercises.   PERFORMING PELVIC FLOOR EXERCISES: 1. Begin by emptying your bladder. 2. Tighten the pelvic floor muscles and hold for a count of 10. 3. Relax the muscles completely for a count of 10. 4. Do 10 repititions, 3 to 5 times a day (morning, afternoon, and night). You can do these exercises at any time and any place. Most people prefer to do the exercises while lying down or sitting in a chair. After 4 - 6 weeks, most people notice some improvement. It may take as long as 3 months to see a major change. After a couple of weeks, you can also try doing a single pelvic floor contraction at times when you are likely to leak (for example, while getting out of a chair). A word of caution: Some people feel that they can speed up the progress by increasing the number of repetitions and the frequency of exercises. However, over-exercising can instead cause muscle fatigue and increase urine leakage. If you feel any discomfort in your abdomen or back while doing these exercises, you are probably doing them wrong. Breathe deeply and relax your body when you are doing these exercises. Make sure you are not tightening your stomach, thigh, buttock, or chest muscles. When done the right way, pelvic floor muscle exercises have been shown to be very effective at improving urinary continence.  Pelvic Floor Pain / Incontinence  Do you suffer from pelvic pain or incontinence? Do you have pain in the pelvis, low back or hips that is associated with sitting, walking, urination or intercourse? Have you experienced leaking of urine or feces  when coughing, sneezing or laughing? Do you have pain in the pelvic area associated with cancer?  These are conditions that are common with pelvic floor muscle dysfunction. Over time, due to stress, scar tissue, surgeries and the natural course of aging, our muscles may become weak or overstressed and can spasm. This can lead to pain, weakness, incontinence or decreased quality of life.  Men and women with pelvic floor dysfunction frequently describe:  A falling out feeling. Pain or burning in the abdomen, tailbone or perineal area. Constipation or bowel elimination problems or difficulty initiating urination. Unresolved low back or hip pain. Frequency and urgency when going to the bathroom. Leaking of urine or feces. Pain with intercourse.  [email or web address]  To make a referral or for more information about [facility] Pelvic Floor Therapy Program, call  [facility]) - [phone] [facility] Patt[facility] Boss [place]) - [phone] [facility]) [phone]  Diverticulosis Diverticulosis is the condition that develops when small pouches (diverticula) form in the wall of your colon. Your colon, or large intestine, is where water is absorbed and stool is formed. The pouches form when the inside layer of your colon pushes through weak spots in the outer layers of your colon. CAUSES  No one knows exactly what causes diverticulosis. RISK FACTORS  Being older than 50. Your risk for this condition increases with age. Diverticulosis is rare in people younger than 40 years. By age 89, almost everyone has it.  Eating a low-fiber diet.  Being frequently constipated.  Being overweight.  Not getting enough exercise.  Smoking.  Taking over-the-counter pain medicines, like aspirin and ibuprofen. SYMPTOMS  Most people with diverticulosis do not  have symptoms. DIAGNOSIS  Because diverticulosis often has no symptoms, health care providers often discover the condition during an exam for other colon problems. In many cases, a  health care provider will diagnose diverticulosis while using a flexible scope to examine the colon (colonoscopy). TREATMENT  If you have never developed an infection related to diverticulosis, you may not need treatment. If you have had an infection before, treatment may include:  Eating more fruits, vegetables, and grains.  Taking a fiber supplement.  Taking a live bacteria supplement (probiotic).  Taking medicine to relax your colon. HOME CARE INSTRUCTIONS   Drink at least 6-8 glasses of water each day to prevent constipation.  Try not to strain when you have a bowel movement.  Keep all follow-up appointments. If you have had an infection before:  Increase the fiber in your diet as directed by your health care provider or dietitian.  Take a dietary fiber supplement if your health care provider approves.  Only take medicines as directed by your health care provider. SEEK MEDICAL CARE IF:   You have abdominal pain.  You have bloating.  You have cramps.  You have not gone to the bathroom in 3 days. SEEK IMMEDIATE MEDICAL CARE IF:   Your pain gets worse.  Yourbloating becomes very bad.  You have a fever or chills, and your symptoms suddenly get worse.  You begin vomiting.  You have bowel movements that are bloody or black. MAKE SURE YOU:  Understand these instructions.  Will watch your condition.  Will get help right away if you are not doing well or get worse.   This information is not intended to replace advice given to you by your health care provider. Make sure you discuss any questions you have with your health care provider.   Document Released: 11/05/2003 Document Revised: 02/12/2013 Document Reviewed: 01/02/2013 Elsevier Interactive Patient Education 2016 [employer]   Gastrostomy Tube Home Guide, Adult A gastrostomy tube is a tube that is surgically placed into the stomach. It is also called a "G-tube." G-tubes are used when a person is unable to eat and drink enough on their own to stay healthy. The tube is inserted into the stomach through a small cut (incision) in the skin. This tube is used for:  Feeding.  Giving medication. GASTROSTOMY TUBE CARE  Wash your hands with soap and water.  Remove the old dressing (if any). Some styles of G-tubes may need a dressing inserted between the skin and the G-tube. Other types of G-tubes do not require a dressing. Ask your health care provider if a dressing is needed.  Check the area where the tube enters the skin (insertion site) for redness, swelling, or pus-like (purulent) drainage. A small amount of clear or tan liquid drainage is normal. Check to make sure scar tissue (skin) is not growing around the insertion site. This could have a raised, bumpy appearance.  A cotton swab can be used to clean the skin around the tube:  When the G-tube is first put in, a normal saline solution or water can be used to clean the skin.  Mild soap and warm water can be used when the skin around the G-tube site has healed.  Roll the cotton swab around the G-tube insertion site to remove any drainage or crusting at the insertion site. STOMACH RESIDUALS Feeding tube residuals are the amount of liquids that are in the stomach at any given time. Residuals may be checked before giving feedings, medications, or as instructed by your health care provider.  Ask your health care provider if there are instances when you would not start tube feedings depending on the amount or  type of contents withdrawn from the stomach.  Check residuals by attaching a syringe to the G-tube and pulling back on the syringe plunger. Note the amount, and return the residual back into the stomach. FLUSHING THE G-TUBE  The G-tube should be  periodically flushed with clean warm water to keep it from clogging.  Flush the G-tube after feedings or medications. Draw up 30 mL of warm water in a syringe. Connect the syringe to the G-tube and slowly push the water into the tube.  Do not push feedings, medications, or flushes rapidly. Flush the G-tube gently and slowly.  Only use syringes made for G-tubes to flush medications or feedings.  Your health care provider may want the G-tube flushed more often or with more water. If this is the case, follow your health care provider's instructions. FEEDINGS Your health care provider will determine whether feedings are given as a bolus (a certain amount given at one time and at scheduled times) or whether feedings will be given continuously on a feeding pump.   Formulas should be given at room temperature.  If feedings are continuous, no more than 4 hours worth of feedings should be placed in the feeding bag. This helps prevent spoilage or accidental excess infusion.  Cover and place unused formula in the refrigerator.  If feedings are continuous, stop the feedings when medications or flushes are given. Be sure to restart the feedings.  Feeding bags and syringes should be replaced as instructed by your health care provider. GIVING MEDICATION   In general, it is best if all medications are in a liquid form for G-tube administration. Liquid medications are less likely to clog the G-tube.  Mix the liquid medication with 30 mL (or amount recommended by your health care provider) of warm water.  Draw up the medication into the syringe.  Attach the syringe to the G-tube and slowly push the mixture into the G-tube.  After giving the medication, draw up 30 mL of warm water in the syringe and slowly flush the G-tube.  For pills or capsules, check with your health care provider first before crushing medications. Some pills are not effective if they are crushed. Some capsules are sustained-release  medications.  If appropriate, crush the pill or capsule and mix with 30 mL of warm water. Using the syringe, slowly push the medication through the tube, then flush the tube with another 30 mL of tap water. G-TUBE PROBLEMS G-tube was pulled out.  Cause: May have been pulled out accidentally.  Solutions: Cover the opening with clean dressing and tape. Call your health care provider right away. The G-tube should be put in as soon as possible (within 4 hours) so the G-tube opening (tract) does not close. The G-tube needs to be put in at a health care setting. An X-ray needs to be done to confirm placement before the G-tube can be used again. Redness, irritation, soreness, or foul odor around the gastrostomy site.  Cause: May be caused by leakage or infection.  Solutions: Call your health care provider right away. Large amount of leakage of fluid or mucus-like liquid present (a large amount means it soaks clothing).  Cause: Many reasons could cause the G-tube to leak.  Solutions: Call your health care provider to discuss the amount of leakage. Skin or scar tissue appears to be growing where tube enters skin.   Cause: Tissue growth may develop around the insertion site if the G-tube is moved or pulled on excessively.  Solutions: Secure tube  with tape so that excess movement does not occur. Call your health care provider. G-tube is clogged.  Cause: Thick formula or medication.  Solutions: Try to slowly push warm water into the tube with a large syringe. Never try to push any object into the tube to unclog it. Do not force fluid into the G-tube. If you are unable to unclog the tube, call your health care provider right away. TIPS  Head of bed (HOB) position refers to the upright position of a person's upper body.  When giving medications or a feeding bolus, keep the [facility] up as told by your health care provider. Do this during the feeding and for 1 hour after the feeding or medication  administration.  If continuous feedings are being given, it is best to keep the [facility] up as told by your health care provider. When ADLs (activities of daily living) are performed and the [facility] needs to be flat, be sure to turn the feeding pump off. Restart the feeding pump when the [facility] is returned to the recommended height.  Do not pull or put tension on the tube.  To prevent fluid backflow, kink the G-tube before removing the cap or disconnecting a syringe.  Check the G-tube length every day. Measure from the insertion site to the end of the G-tube. If the length is longer than previous measurements, the tube may be coming out. Call your health care provider if you notice increasing G-tube length.  Oral care, such as brushing teeth, must be continued.  You may need to remove excess air (vent) from the G-tube. Your health care provider will tell you if this is needed.  Always call your health care provider if you have questions or problems with the G-tube. SEEK IMMEDIATE MEDICAL CARE IF:   You have severe abdominal pain, tenderness, or abdominal bloating (distension).  You have nausea or vomiting.  You are constipated or have problems moving your bowels.  The G-tube insertion site is red, swollen, has a foul smell, or has yellow or brown drainage.  You have difficulty breathing or shortness of breath.  You have a fever.  You have a large amount of feeding tube residuals.  The G-tube is clogged and cannot be flushed. MAKE SURE YOU:   Understand these instructions.  Will watch your condition.  Will get help right away if you are not doing well or get worse.   This information is not intended to replace advice given to you by your health care provider. Make sure you discuss any questions you have with your health care provider.   Document Released: 04/18/2001 Document Revised: 06/24/2014 Document Reviewed: 10/15/2012 Elsevier Interactive Patient Education [employer]2016 [employer]

## 2014-12-04 NOTE — Consult Note (Signed)
Chief Complaint: Patient was seen in consultation today for percutaneous gastrostomy tube placement    Referring Physician(s): Gross, S/TRH  History of Present Illness: Julie Richardson is a 81 y.o. female with past medical history significant for anemia, diabetes, atrial fibrillation, spinal stenosis, GERD, chronic kidney disease, arthritis, blindness left eye, remote TIA, COPD , hearing difficulty, recurrent aspiration pneumonia and repair of a colovaginal fistula in addition to sigmoid colectomy most likely secondary to diverticulitis on 11/28/14. Since her recent surgery the patient has had progressive difficulty swallowing with aspiration noted on recent swallow evaluation and esophageal dysmotility but no stricture or obstruction of the esophagus. She has no anatomic pathology in the region of the cricopharyngeus or detectable gag reflex. Request has now been made for percutaneous gastrostomy tube placement to assist with nutritional demands.  Past Medical History  Diagnosis Date  . HOH (hard of hearing)   . Anemia   . Diabetes mellitus without complication (HCC)   . History of home oxygen therapy     uses 2 liters at hs and prn during day  . Atrial fibrillation (HCC)     hx of  . Spinal stenosis   . GERD (gastroesophageal reflux disease)   . Chronic kidney disease     stage 3  . Arthritis   . Dysphagia     uses thickened liquids  . Hyperlipidemia   . Pelvis fracture (HCC) 6 years ago  . Complication of anesthesia     slow to wake up  . Blind left eye   . HOH (hard of hearing)   . TIA (transient ischemic attack)     7 YEARS AGO, NONE SINCE  . Pneumonia oct 2015    ASPIRATION PNEUMONIA X 3- 4 TIMES IN PAST  . COPD (chronic obstructive pulmonary disease) (HCC)     pseodonomonus bronchiectasis    Past Surgical History  Procedure Laterality Date  . Abdominal hysterectomy    . Right shoulder surgery  years ago    for fracture  . Tonsillectomy  as child     Allergies: Coumadin and Sulfa antibiotics  Medications: Prior to Admission medications   Medication Sig Start Date End Date Taking? Authorizing Provider  acetaminophen (TYLENOL) 325 MG tablet Take 650 mg by mouth 2 (two) times daily.    Yes Historical Provider, MD  Ascorbic Acid (VITAMIN C) 1000 MG tablet Take 1,000 mg by mouth 2 (two) times daily.   Yes Historical Provider, MD  atorvastatin (LIPITOR) 20 MG tablet Take 20 mg by mouth at bedtime.    Yes Historical Provider, MD  azithromycin (ZITHROMAX) 250 MG tablet Take 250 mg by mouth daily. For chronic obstructive pulmonary disease   Yes Historical Provider, MD  bisacodyl (DULCOLAX) 5 MG EC tablet Take 5 mg by mouth 2 (two) times daily.   Yes Historical Provider, MD  cholecalciferol (VITAMIN D) 1000 UNITS tablet Take 2,000 Units by mouth daily.   Yes Historical Provider, MD  cycloSPORINE (RESTASIS) 0.05 % ophthalmic emulsion Place 1 drop into both eyes 2 (two) times daily.   Yes Historical Provider, MD  erythromycin ophthalmic ointment Place 1 application into both eyes at bedtime.   Yes Historical Provider, MD  ipratropium-albuterol (DUONEB) 0.5-2.5 (3) MG/3ML SOLN Take 3 mLs by nebulization 3 (three) times daily.   Yes Historical Provider, MD  omeprazole (PRILOSEC) 20 MG capsule Take 20 mg by mouth daily.   Yes Historical Provider, MD  Polyethyl Glycol-Propyl Glycol (LUBRICANT EYE DROPS) 0.4-0.3 % SOLN Apply  1 drop to eye as needed (for dry eyes).   Yes Historical Provider, MD  polyethylene glycol (MIRALAX / GLYCOLAX) packet Take 17 g by mouth daily.   Yes Historical Provider, MD  predniSONE (DELTASONE) 5 MG tablet Take 5 mg by mouth daily with breakfast.   Yes Historical Provider, MD  sotalol (BETAPACE) 80 MG tablet Take 80 mg by mouth 2 (two) times daily.   Yes Historical Provider, MD  hydrocortisone (ANUSOL-HC) 2.5 % rectal cream Place 1 application rectally as needed for hemorrhoids or itching.    Historical Provider, MD   hydroxypropyl methylcellulose / hypromellose (ISOPTO TEARS / GONIOVISC) 2.5 % ophthalmic solution Place 1 drop into both eyes as needed for dry eyes.    Historical Provider, MD  traMADol (ULTRAM) 50 MG tablet Take 1-2 tablets (50-100 mg total) by mouth every 6 (six) hours as needed for moderate pain or severe pain. 11/28/14   Karie Soda, MD     History reviewed. No pertinent family history.  Social History   Social History  . Marital Status: Widowed    Spouse Name: N/A  . Number of Children: N/A  . Years of Education: N/A   Social History Main Topics  . Smoking status: Never Smoker   . Smokeless tobacco: Never Used  . Alcohol Use: No  . Drug Use: No  . Sexual Activity: Not Asked   Other Topics Concern  . None   Social History Narrative      Review of Systems  Constitutional: Negative for fever and chills.  HENT: Positive for hearing loss and trouble swallowing.   Respiratory: Negative for shortness of breath.        Occasional coughing up of secretions  Cardiovascular: Negative for chest pain.  Gastrointestinal: Positive for abdominal pain. Negative for nausea, vomiting and blood in stool.  Genitourinary: Negative for dysuria and hematuria.  Musculoskeletal: Negative for back pain.  Neurological: Negative for headaches.    Vital Signs: BP 110/94 mmHg  Pulse 81  Temp(Src) 97.6 F (36.4 C) (Oral)  Resp 18  Ht  (1.499 m)  Wt 91 lb 7.9 oz (41.5 kg)  BMI 18.47 kg/m2  SpO2 93%  Physical Exam  Constitutional: She is oriented to person, place, and time.  Thin elderly white female in no acute distress.  Eyes:  Blind in left eye  Cardiovascular: Normal rate and regular rhythm.   Pulmonary/Chest: Effort normal.  Few bibasilar crackles  Abdominal: Soft. Bowel sounds are normal. There is tenderness.  Tenderness noted at surgical incision sites with some soft tissue fullness/protrusion in the epigastric region which is more tender to palpation, possibly  hematoma from prior lovenox injection  Musculoskeletal: Normal range of motion. She exhibits no edema.  Neurological: She is alert and oriented to person, place, and time.    Mallampati Score:     Imaging: Dg Chest 2 View  12/02/2014  CLINICAL DATA:  Dysphasia.  Shortness of breath. EXAM: CHEST  2 VIEW COMPARISON:  None. FINDINGS: Mediastinum and hilar structures are normal. Cardiomegaly with mild pulmonary vascular prominence and interstitial prominence with small pleural effusions. These findings suggest mild congestive heart failure. Bibasilar subsegmental atelectasis and/or scarring. No acute bony abnormality. IMPRESSION: Cardiomegaly with mild pulmonary vascular prominence and interstitial prominence with small bilateral pleural effusions consistent with mild congestive heart failure. Electronically Signed   By: Maisie Fus  Register   On: 12/02/2014 09:56   Dg Chest 2 View  11/30/2014  CLINICAL DATA:  Chronic bronchiectasis and dyspnea. EXAM: CHEST  2 VIEW COMPARISON:  04/26/2014 FINDINGS: Cardiomediastinal silhouette is normal. Mediastinal contours appear intact. Aorta is torturous. There is no evidence of pneumothorax. There is bibasilar atelectasis. The previously seen areas of bronchiectasis and peribronchial thickening have improved but not completely resolved radiographically. There may be small bilateral pleural effusions. Osseous structures are without acute abnormality. There is compression deformity of 2 of the mid to lower thoracic vertebral bodies, stable from CT dated 03/29/2014. Soft tissues are grossly normal. IMPRESSION: Persistent areas of bronchiectasis and peribronchial thickening, with lower lobe predominance. Bibasilar atelectasis. Possible small bilateral pleural effusions. Electronically Signed   By: Ted Mcalpineobrinka  Dimitrova M.D.   On: 11/30/2014 12:17   Ct Head Wo Contrast  11/30/2014  CLINICAL DATA:  Reason surgery with new onset of slurred speech postoperatively. EXAM: CT HEAD  WITHOUT CONTRAST TECHNIQUE: Contiguous axial images were obtained from the base of the skull through the vertex without intravenous contrast. COMPARISON:  MRI same day.  Head CT 04/26/2014 FINDINGS: No sign of acute infarction. There is mild generalized brain atrophy. There are mild chronic small-vessel ischemic changes within the white matter. There are old lacunar infarctions in the right basal ganglia and thalamus. Physiologic calcification is present in the basal ganglia bilaterally. The patient has benign dural calcification. No mass lesion, hemorrhage, hydrocephalus or extra-axial collection. The calvarium is unremarkable. There is extensive opacification of the maxillary sinuses consistent with sinusitis. There is atherosclerotic calcification of the major vessels at the base of the brain. IMPRESSION: Atrophy and chronic small vessel disease. No acute intracranial finding. Maxillary sinusitis. Electronically Signed   By: Paulina FusiMark  Shogry M.D.   On: 11/30/2014 13:55   Mr Brain Wo Contrast  11/30/2014  CLINICAL DATA:  New onset slurred speech.  Recent colectomy. EXAM: MRI HEAD WITHOUT CONTRAST TECHNIQUE: Multiplanar, multiecho pulse sequences of the brain and surrounding structures were obtained without intravenous contrast. COMPARISON:  Head CT 04/26/2014 FINDINGS: There is no evidence of acute infarct, intracranial hemorrhage, mass, midline shift, or extra-axial fluid collection. There is moderate generalized cerebral atrophy. T2 hyperintensities in the subcortical and deep cerebral white matter and pons are nonspecific but compatible with mild chronic small vessel ischemic disease. A chronic right basal ganglia lacunar infarct is again noted. Prior right cataract extraction is noted. Chronic bilateral maxillary sinusitis is again noted with likely proteinaceous/inspissated material filling both sinuses. There is also mild left ethmoid air cell mucosal thickening. A trace right mastoid effusion is noted. Major  intracranial vascular flow voids are preserved. IMPRESSION: 1. No acute intracranial abnormality. 2. Mild chronic small vessel ischemic disease. Chronic right basal ganglia lacunar infarct. Electronically Signed   By: Sebastian AcheAllen  Grady M.D.   On: 11/30/2014 13:31   Dg Esophagus  12/02/2014  CLINICAL DATA:  Dysphagia. Aspiration on swallowing function study yesterday. History of aspiration pneumonia. EXAM: ESOPHOGRAM/BARIUM SWALLOW TECHNIQUE: Single contrast examination was performed using thin and thick barium. FLUOROSCOPY TIME:  Radiation Exposure Index (as provided by the fluoroscopic device): If the device does not provide the exposure index: Fluoroscopy Time:  2 minutes 5 seconds Number of Acquired Images: COMPARISON:  None. FINDINGS: Abnormal pharyngeal phase of swallowing. There was pooling of thick and thin barium in the valleculae and piriform sinuses. The patient had difficulty coordinating swallowing. Patient had difficulty moving the bolus into the esophagus. Initial swallows with thin barium revealed penetration without aspiration. Additional thick barium swallows progressed to frank aspiration of a mild amount. The esophageal motility is diminished. No stricture or mass. Limited evaluation of the esophagus  due to limited barium in the esophagus. IMPRESSION: Poor esophageal motility with difficulty propelling the bolus into the esophagus. There is pooling in the pharynx. There was aspiration of thick barium. There was penetration with thin barium. Limited evaluation the esophagus due to small boluses. There is esophageal dysmotility. No stricture or obstruction of the esophagus. Electronically Signed   By: Marlan Palau M.D.   On: 12/02/2014 11:40   Dg Swallowing Func-speech Pathology  12/01/2014  Objective Swallowing Evaluation:   MBS Patient Details Name: Julie Richardson MRN: [id number] Date of Birth: [birth year] Today's Date: 12/01/2014 Time: SLP Start Time (ACUTE ONLY): 1348-SLP Stop Time (ACUTE ONLY):  1406 SLP Time Calculation (min) (ACUTE ONLY): 18 min Past Medical History: Past Medical History Diagnosis Date . HOH (hard of hearing)  . Anemia  . Diabetes mellitus without complication (HCC)  . History of home oxygen therapy    uses 2 liters at hs and prn during day . Atrial fibrillation (HCC)    hx of . Spinal stenosis  . GERD (gastroesophageal reflux disease)  . Chronic kidney disease    stage 3 . Arthritis  . Dysphagia    uses thickened liquids . Hyperlipidemia  . Pelvis fracture (HCC) 6 years ago . Complication of anesthesia    slow to wake up . Blind left eye  . HOH (hard of hearing)  . TIA (transient ischemic attack)    7 YEARS AGO, NONE SINCE . Pneumonia oct 2015   ASPIRATION PNEUMONIA X 3- 4 TIMES IN PAST . COPD (chronic obstructive pulmonary disease) (HCC)    pseodonomonus bronchiectasis Past Surgical History: Past Surgical History Procedure Laterality Date . Abdominal hysterectomy   . Right shoulder surgery  years ago   for fracture . Tonsillectomy  as child HPI: Other Pertinent Information: Pt is s/p XI robot assisted SIGMOID COLECTOMY, REPAIR OF COLOVAGINAL FISTULA,RIGID PROCTOSCOPY.   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 (overnight in 08/2013).  Pt reports using nectar thick liquids over the last year or so.  Swallow eval ordered as pt reports difficulty swallowing medicine - even when crushed.   No Data Recorded Assessment / Plan / Recommendation CHL IP CLINICAL IMPRESSIONS 12/01/2014 Therapy Diagnosis Suspected primary esophageal dysphagia;Mild pharyngeal phase dysphagia Clinical Impression Patient presents with a suspected primary esophageal dysphagia with mild pharyngeal component. Oral phase WFL. Timing of swallow adequate. Anterior hyo-laryngeal movement appearing mildly reduced by not significant enough to account for severely impaired opening of the UES. Boluses of all consistencies trialed sit in pyriform  sinuses post swallow with little-no clearance of bolus into the esophagus. SLP instructed patient for a variety of head postures (head turns, tilts, and chin tucks) without change in function. Patient with eventual penetration and aspiration of consistencies provided with intact sensation and clearance of airway but inability to clear via esophagus requiring expectoration of bolus. Swallow is at this time non-functional given little-no clearance of bolus into esophagus. Per patient and MBS note from 2015, dysphagia only mild and functional. Patient was consuming po prior to admission. Of note, discussion with Dr. Jomarie Longs revealed that patient has had multiple recent admissions to [facility] for aspiration PNA. At this time, while brief intubation and deconditioning may account for some decline in dysfunction, question if primary esophageal deficit is to blame for current level of dysfunction. Unclear baseline further complicates diagnostics. ENT or GI consult may be beneficial in ruling out primary UES dysfunction  and determining potential for intervention to improve. SLP will continue to f/u at bedside to monitor for a facilitate improved swallowing function, assisting to determine potential to resume a po diet.    CHL IP TREATMENT RECOMMENDATION 12/01/2014 Treatment Recommendations Therapy as outlined in treatment plan below   CHL IP DIET RECOMMENDATION 12/01/2014 SLP Diet Recommendations Ice chips PRN after oral care;NPO Liquid Administration via (None) Medication Administration Via alternative means Compensations (None) Postural Changes and/or Swallow Maneuvers (None)   CHL IP OTHER RECOMMENDATIONS 12/01/2014 Recommended Consults Consider ENT evaluation;Consider GI evaluation Oral Care Recommendations Oral care QID Other Recommendations (None)     CHL IP FREQUENCY AND DURATION 12/01/2014 Speech Therapy Frequency (ACUTE ONLY) min 1 x/week Treatment Duration 1 week       CHL IP REASON FOR REFERRAL  12/01/2014 Reason for Referral Objectively evaluate swallowing function       CHL IP GO 09/18/2013 Functional Assessment Tool Used skilled clinical judgment Functional Limitations Swallowing Swallow Current Status (Z6109) CJ Swallow Goal Status (U0454) CJ Swallow Discharge Status (U9811) CJ Motor Speech Current Status (B1478) (None) Motor Speech Goal Status (G9562) (None) Motor Speech Goal Status (Z3086) (None) Spoken Language Comprehension Current Status (V7846) (None) Spoken Language Comprehension Goal Status (N6295) (None) Spoken Language Comprehension Discharge Status [phone]) (None) Spoken Language Expression Current Status (K4401) (None) Spoken Language Expression Goal Status (U2725) (None) Spoken Language Expression Discharge Status [phone]) (None) Attention Current Status (I3474) (None) Attention Goal Status (Q5956) (None) Attention Discharge Status (L8756) (None) Memory Current Status (E3329) (None) Memory Goal Status (J1884) (None) Memory Discharge Status (Z6606) (None) Voice Current Status (T0160) (None) Voice Goal Status (F0932) (None) Voice Discharge Status (T5573) (None) Other Speech-Language Pathology Functional Limitation (U2025) (None) Other Speech-Language Pathology Functional Limitation Goal Status (K2706) (None) Other Speech-Language Pathology Functional Limitation Discharge Status [phone]) (None) Ferdinand Lango MA, CCC-SLP [phone]   McCoy Leah Meryl 12/01/2014, 2:37 PM    Labs:  CBC:  Recent Labs  12/01/14 0545 12/02/14 0427 12/03/14 0501 12/04/14 0517  WBC 10.4 7.4 7.0 6.6  HGB 8.9* 9.6* 8.8* 9.5*  HCT 28.5* 30.0* 27.3* 29.3*  PLT 236 235 225 236    COAGS:  Recent Labs  12/04/14 1145  INR 1.30  APTT 26    BMP:  Recent Labs  12/01/14 0545 12/02/14 0427 12/03/14 0501 12/04/14 0517  NA 140 136 139 135  K 3.5 3.0* 3.0* 3.6  CL 109 104 105 101  CO2 18* GLUCOSE 52* 157* 137* 116*  BUN <5*  CALCIUM 8.4* 8.2* 8.1* 8.0*  CREATININE 0.78 0.73  0.60 0.58  GFRNONAA >60 >60 >60 >60  GFRAA >60 >60 >60 >60    LIVER FUNCTION TESTS:  Recent Labs  04/26/14 0800 12/03/14 0806  BILITOT 1.2 0.9  AST 41* 33  ALT 27 17  ALKPHOS 117 48  PROT 7.9 6.2*  ALBUMIN 3.4* 2.7*    TUMOR MARKERS: No results for input(s): AFPTM, CEA, CA199, CHROMGRNA in the last 8760 hours.  Assessment and Plan: Gean Laursen is a 81 y.o. female with past medical history significant for anemia, diabetes, atrial fibrillation, spinal stenosis, GERD, chronic kidney disease, arthritis, blindness left eye, remote TIA, COPD on home O2 , hearing difficulty, recurrent aspiration pneumonia and repair of a colovaginal fistula in addition to sigmoid colectomy most likely secondary to diverticulitis on 11/28/14. Since her recent surgery the patient has had progressive difficulty swallowing with aspiration noted on recent swallow evaluation and esophageal dysmotility but no stricture  or obstruction of the esophagus. She has no anatomic pathology in the region of the cricopharyngeus or detectable gag reflex. Request has now been made for percutaneous gastrostomy tube placement to assist with nutritional demands. Imaging studies and labs have been reviewed. Case has been discussed with Dr. Michaell Cowing. Risks and benefits discussed with the patient/son Julie Richardson  including, but not limited to the need for a barium enema during the procedure, bleeding, infection, peritonitis, or damage to adjacent structures.All of the patient's questions were answered, patient is agreeable to proceed.Consent signed and in chart. Patient did receive Lovenox injection this morning therefore will plan to perform case later this afternoon with light IV sedation    Thank you for this interesting consult.  I greatly enjoyed meeting Julie Richardson and look forward to participating in their care.  A copy of this report was sent to the requesting provider on this date.  Signed: D. Jeananne Rama 12/04/2014, 12:46  PM   I spent a total of 40 minutes in face to face in clinical consultation, greater than 50% of which was counseling/coordinating care for percutaneous gastrostomy tube placement

## 2014-12-04 NOTE — Progress Notes (Signed)
Patient ID: Julie Richardson, female   DOB: 10/01/1925, 81 y.o.   MRN: [id number]  [facility] PROGRESS NOTE  Shontell Prosser WGN:[id number] DOB: 11/08/1925 DOA: 11/28/2014 PCP: Darlina Guys, MD   Brief narrative:    81 yo female with chronic dysphagia, stroke 7 yrs ago, RA on chronic prednisone, remote paroxysmal a-fib but no longer on AC, colo-vaginal fistula, who was admitted 10/07 under surgery team and underwent sigmoid colectomy on 10/7. [facility] was consulted for evaluation of slurred speech.  Assessment/Plan:    Principal Problem:   Colovaginal fistula s/p robotic colectomy/repair 11/28/2014 - post op day #6, clinically stable - management per surgery team  - path report unremarkable   Active Problems:   Chronic dysphagia with aspiration - ENT seen in consultation, pt at high risk for aspiration as there is a significant sensorimotor defect in initiating swallow - pt is also subsequently at high risk of aspiration PNA and she already has history of recurrent episodes - GI and IR team already consulted     Sinus tachycardia  - rate controlled on metoprolol IV for now which is given 4 x day - no chest pain or shortness of breath this AM     COPD (chronic obstructive pulmonary disease) (HCC) - respiratory status is stable this AM    GERD (gastroesophageal reflux disease) - continue with Protonix IV    Bronchiectasis with Pseudomonas without acute exacerbation (HCC) - continue IV Ceftazidime as pt is now NPO    Hypokalemia  supplemented and WNL this AM     Hypomagnesemia - supplemented and WNL this AM    Protein-calorie malnutrition, moderate (HCC), underweight  - must keep NPO for now - GI team consulted for assistance - Body mass index is 17.53     RA on chronic Prednisone - currently covered with Solumedrol IV until feeding issue resolved   DVT prophylaxis - SCD's  Code Status: DNR Family Communication:  plan of care discussed with the patient Disposition Plan:  To be determined, per surgery team   IV access:  Peripheral IV  Procedures and diagnostic studies:    CXR 12/02/2014  Cardiomegaly with mild pulmonary vascular prominence, c/w mild congestive heart failure.  CXR 11/30/2014 Persistent areas of bronchiectasis and peribronchial thickening, with lower lobe predominance.  Ct Head Wo Contrast  11/30/2014 Atrophy and chronic small vessel disease. No acute intracranial finding.  MRI Brain 11/30/2014  No acute abnormality,chronic small vessel ischemia, chronic R basal ganglia lacunar infarct.   Dg Esophagus 12/02/2014 Poor esophageal motility with difficulty propelling the bolus into the esophagus. There is pooling in the pharynx. There was aspiration of thick barium. There was penetration with thin barium. Limited evaluation the esophagus due to small boluses. There is esophageal dysmotility. No stricture or obstruction of the esophagus.   Medical Consultants:  GI ENT  Other Consultants:  PT - home health PT  IAnti-Infectives:   None  Debbora Presto, MD  [facility] Pager [phone]  If 7PM-7AM, please contact night-coverage [email or web address] Password [email or web address] 12/04/2014, 7:03 AM   LOS: 6 days   HPI/Subjective: No events overnight.   Objective: Filed Vitals:   12/03/14 1441 12/03/14 2000 12/03/14 2229 12/04/14 0435  BP: 139/82  162/77 110/94  Pulse: 83  83 93  Temp: 98.3 F (36.8 C)  98.4 F (36.9 C) 97.6 F (36.4 C)  TempSrc: Oral  Oral Oral  Resp: Height:      Weight:    41.5 kg (91 lb 7.9 oz)  SpO2: 97% 94% 96% 92%    Intake/Output Summary (Last 24 hours) at 12/04/14 0703 Last data filed at 12/04/14 0600  Gross per 24 hour  Intake   1835 ml  Output   1250 ml  Net    585 ml    Exam:   General:  Pt is alert, follows commands appropriately, not in acute distress  Cardiovascular: Regular rate and rhythm, no rubs, no gallops  Respiratory: Clear to auscultation bilaterally, no wheezing, no crackles, no  rhonchi  Abdomen: Soft, non tender, non distended, bowel sounds present, no guarding  Data Reviewed: Basic Metabolic Panel:  Recent Labs Lab 11/29/14 0505 12/01/14 0545 12/02/14 0427 12/03/14 0501 12/04/14 0517  NA 138 140 136 139 135  K 4.0 3.5 3.0* 3.0* 3.6  CL 107 109 104 105 101  CO2 23 18* 25 26 28   GLUCOSE 100* 52* 157* 137* 116*  BUN 34* 20 11 8  <5*  CREATININE 1.17* 0.78 0.73 0.60 0.58  CALCIUM 8.3* 8.4* 8.2* 8.1* 8.0*  MG 1.5*  --  1.5*  --  2.4   Liver Function Tests:  Recent Labs Lab 12/03/14 0806  AST 33  ALT 17  ALKPHOS 48  BILITOT 0.9  PROT 6.2*  ALBUMIN 2.7*    Recent Labs Lab 12/03/14 0806  LIPASE 33   CBC:  Recent Labs Lab 11/29/14 0505 12/01/14 0545 12/02/14 0427 12/03/14 0501 12/04/14 0517  WBC 9.4 10.4 7.4 7.0 6.6  HGB 8.8* 8.9* 9.6* 8.8* 9.5*  HCT 27.3* 28.5* 30.0* 27.3* 29.3*  MCV 92.5 96.3 92.3 91.6 91.6  PLT 229 236 235 225 236   CBG:  Recent Labs Lab 11/28/14 1012  GLUCAP 67   Scheduled Meds: . cefTAZidime   IV  1 g Intravenous Q12H  . cycloSPORINE  1 drop Both Eyes BID  . enoxaparin  injection  30 mg Subcutaneous Q24H  . erythromycin  1 application Both Eyes QHS  . hydrocortisone  inj  20 mg Intravenous Daily  . ipratropium-albuterol  3 mL Nebulization BID  . metoprolol  10 mg Intravenous 4 times per day  . pantoprazole  IV  40 mg Intravenous QHS   Continuous Infusions: . dextrose 5 % and 0.45 % NaCl with KCl 40 mEq/L 75 mL/hr at 12/04/14 0001

## 2014-12-05 LAB — CREATININE, SERUM
Creatinine, Ser: 0.69 mg/dL (ref 0.44–1.00)
GFR calc Af Amer: >60 mL/min (ref >60)
GFR calc non Af Amer: >60 mL/min (ref >60)

## 2014-12-05 MED ORDER — ATORVASTATIN CALCIUM 20 MG PO TABS
20.0000 mg | ORAL_TABLET | Freq: Every day | ORAL | Status: DC
  Administered 2014-12-05 – 2014-12-09 (×5): 20 mg via ORAL
  Filled 2014-12-05: qty 2
  Filled 2014-12-05 (×2): qty 1
  Filled 2014-12-05: qty 2
  Filled 2014-12-05 (×2): qty 1

## 2014-12-05 MED ORDER — PREDNISONE 5 MG PO TABS
5.0000 mg | ORAL_TABLET | Freq: Every day | ORAL | Status: DC
  Administered 2014-12-05 – 2014-12-06 (×2): 5 mg via ORAL
  Filled 2014-12-05 (×3): qty 1

## 2014-12-05 MED ORDER — SODIUM CHLORIDE 0.9 % IJ SOLN: 3.0000 mL | INTRAMUSCULAR | Status: DC | PRN

## 2014-12-05 MED ORDER — SODIUM CHLORIDE 0.9 % IV SOLN
250.0000 mL | INTRAVENOUS | Status: DC | PRN
  Administered 2014-12-06: 250 mL via INTRAVENOUS

## 2014-12-05 MED ORDER — VITAMIN D3 25 MCG (1000 UNIT) PO TABS
2000.0000 [IU] | ORAL_TABLET | Freq: Every day | ORAL | Status: DC
  Administered 2014-12-05 – 2014-12-10 (×6): 2000 [IU] via ORAL
  Filled 2014-12-05 (×6): qty 2

## 2014-12-05 MED ORDER — POTASSIUM CHLORIDE CRYS ER 20 MEQ PO TBCR: 20.0000 meq | EXTENDED_RELEASE_TABLET | ORAL | Status: DC | PRN

## 2014-12-05 MED ORDER — FREE WATER
100.0000 mL | Freq: Three times a day (TID) | Status: DC
  Administered 2014-12-05 – 2014-12-09 (×11): 100 mL

## 2014-12-05 MED ORDER — AZITHROMYCIN 250 MG PO TABS
250.0000 mg | ORAL_TABLET | Freq: Every day | ORAL | Status: DC
  Administered 2014-12-05 – 2014-12-10 (×6): 250 mg via ORAL
  Filled 2014-12-05 (×6): qty 1

## 2014-12-05 MED ORDER — JEVITY 1.2 CAL PO LIQD
1000.0000 mL | ORAL | Status: DC
  Filled 2014-12-05: qty 1000

## 2014-12-05 MED ORDER — JEVITY 1.2 CAL PO LIQD
1000.0000 mL | ORAL | Status: DC
  Administered 2014-12-05 – 2014-12-06 (×2): 1000 mL
  Administered 2014-12-07: 15:00:00
  Filled 2014-12-05 (×3): qty 1000

## 2014-12-05 MED ORDER — METOCLOPRAMIDE HCL 5 MG/5ML PO SOLN
10.0000 mg | Freq: Four times a day (QID) | ORAL | Status: DC
  Administered 2014-12-05 – 2014-12-07 (×7): 10 mg
  Filled 2014-12-05 (×10): qty 10

## 2014-12-05 MED ORDER — LACTATED RINGERS IV BOLUS (SEPSIS): 1000.0000 mL | Freq: Three times a day (TID) | INTRAVENOUS | Status: DC | PRN

## 2014-12-05 MED ORDER — FENTANYL CITRATE (PF) 100 MCG/2ML IJ SOLN: 25.0000 ug | INTRAMUSCULAR | Status: DC | PRN

## 2014-12-05 MED ORDER — SODIUM CHLORIDE 0.9 % IJ SOLN
3.0000 mL | Freq: Two times a day (BID) | INTRAMUSCULAR | Status: DC
  Administered 2014-12-05 – 2014-12-09 (×3): 3 mL via INTRAVENOUS

## 2014-12-05 MED ORDER — VITAMIN C 500 MG PO TABS: 1000.0000 mg | ORAL_TABLET | Freq: Two times a day (BID) | ORAL | Status: DC

## 2014-12-05 MED ORDER — VITAMIN C 500 MG PO TABS
500.0000 mg | ORAL_TABLET | Freq: Two times a day (BID) | ORAL | Status: DC
  Administered 2014-12-05 – 2014-12-08 (×7): 500 mg via ORAL
  Filled 2014-12-05 (×8): qty 1

## 2014-12-05 MED ORDER — PANTOPRAZOLE SODIUM 40 MG PO TBEC
80.0000 mg | DELAYED_RELEASE_TABLET | Freq: Every day | ORAL | Status: DC
  Administered 2014-12-05: 80 mg via ORAL
  Filled 2014-12-05: qty 2

## 2014-12-05 MED ORDER — TRAMADOL HCL 50 MG PO TABS
50.0000 mg | ORAL_TABLET | Freq: Four times a day (QID) | ORAL | Status: DC | PRN
  Administered 2014-12-08: 50 mg via ORAL
  Filled 2014-12-05: qty 1

## 2014-12-05 MED ORDER — SOTALOL HCL 80 MG PO TABS
80.0000 mg | ORAL_TABLET | Freq: Two times a day (BID) | ORAL | Status: DC
  Administered 2014-12-05 (×2): 80 mg via ORAL
  Filled 2014-12-05 (×4): qty 1

## 2014-12-05 NOTE — Progress Notes (Signed)
Patient ID: Julie Richardson, female   DOB: 1925/11/17, 81 y.o.   MRN: [id number]  [facility] PROGRESS NOTE  Julie Richardson WGN:[id number] DOB: 1925-12-02 DOA: 11/28/2014 PCP: Darlina Guys, MD   Brief narrative:    81 yo female with chronic dysphagia, stroke 7 yrs ago, RA on chronic prednisone, remote paroxysmal a-fib but no longer on AC, colo-vaginal fistula, who was admitted 10/07 under surgery team and underwent sigmoid colectomy on 10/7. [facility] was consulted for evaluation of slurred speech.  Assessment/Plan:    Principal Problem:   Colovaginal fistula s/p robotic colectomy/repair 11/28/2014 - post op day #7, clinically stable - management per surgery team  - path report unremarkable   Active Problems:   Chronic dysphagia with aspiration - ENT seen in consultation, pt at high risk for aspiration as there is a significant sensorimotor defect in initiating swallow - pt is now s/p fluoroscopic guided  Insertion of gastrostomy tube, TF initiated    Sinus tachycardia  - rate controlled on metoprolol IV for now which is given 4 x day - no chest pain or shortness of breath this AM     COPD (chronic obstructive pulmonary disease) (HCC) - respiratory status is stable this AM    GERD (gastroesophageal reflux disease) - continue with Protonix IV    Bronchiectasis with Pseudomonas without acute exacerbation (HCC) - continue IV Ceftazidime as pt is now NPO    Hypokalemia - supplemented and WNL 10/13 - will repeat BMP again in AM     Hypomagnesemia - supplemented and WNL 10/13    Protein-calorie malnutrition, moderate (HCC), underweight  - gastrostomy tube placed and TF started  - Body mass index is 17.53     RA on chronic Prednisone - currently covered with Solumedrol IV  DVT prophylaxis - SCD's  Code Status: DNR Family Communication:  plan of care discussed with the patient Disposition Plan: To be determined, per surgery team   IV access:  Peripheral IV  Procedures and  diagnostic studies:    CXR 12/02/2014  Cardiomegaly with mild pulmonary vascular prominence, c/w mild congestive heart failure.  CXR 11/30/2014 Persistent areas of bronchiectasis and peribronchial thickening, with lower lobe predominance.  Ct Head Wo Contrast  11/30/2014 Atrophy and chronic small vessel disease. No acute intracranial finding.  MRI Brain 11/30/2014  No acute abnormality,chronic small vessel ischemia, chronic R basal ganglia lacunar infarct.   Dg Esophagus 12/02/2014 Poor esophageal motility with difficulty propelling the bolus into the esophagus. There is pooling in the pharynx. There was aspiration of thick barium. There was penetration with thin barium. Limited evaluation the esophagus due to small boluses. There is esophageal dysmotility. No stricture or obstruction of the esophagus.   Medical Consultants:  GI ENT  Other Consultants:  PT - home health PT  IAnti-Infectives:   None  Debbora Presto, MD  [facility] Pager [phone]  If 7PM-7AM, please contact night-coverage [email or web address] Password [email or web address] 12/05/2014, 10:18 AM   LOS: 7 days   HPI/Subjective: No events overnight.   Objective: Filed Vitals:   12/04/14 1635 12/04/14 2246 12/05/14 0602 12/05/14 0830  BP: 104/56 105/67 114/62   Pulse: 102 95 95 110  Temp: 98.1 F (36.7 C) 97.4 F (36.3 C) 97.2 F (36.2 C)   TempSrc: Oral Oral Oral   Resp: Height:      Weight:   72.8 kg (160 lb 7.9 oz)   SpO2: 93% 100% 100% 95%    Intake/Output Summary (Last 24 hours) at 12/05/14 1018  Last data filed at 12/05/14 0910  Gross per 24 hour  Intake   1810 ml  Output    750 ml  Net   1060 ml    Exam:   General:  Pt is alert, follows commands appropriately, not in acute distress  Cardiovascular: Regular rate and rhythm, no rubs, no gallops  Respiratory: Clear to auscultation bilaterally, no wheezing, no crackles, no rhonchi  Abdomen: Soft, non tender, non distended, bowel sounds present, no  guarding  Data Reviewed: Basic Metabolic Panel:  Recent Labs Lab 11/29/14 0505 12/01/14 0545 12/02/14 0427 12/03/14 0501 12/04/14 0517 12/05/14 0523  NA 138 140 136 139 135  --   K 4.0 3.5 3.0* 3.0* 3.6  --   CL 107 109 104 105 101  --   CO2 23 18* 25 26 28   --   GLUCOSE 100* 52* 157* 137* 116*  --   BUN 34* 20 11 8  <5*  --   CREATININE 1.17* 0.78 0.73 0.60 0.58 0.69  CALCIUM 8.3* 8.4* 8.2* 8.1* 8.0*  --   MG 1.5*  --  1.5*  --  2.4  --    Liver Function Tests:  Recent Labs Lab 12/03/14 0806  AST 33  ALT 17  ALKPHOS 48  BILITOT 0.9  PROT 6.2*  ALBUMIN 2.7*    Recent Labs Lab 12/03/14 0806  LIPASE 33   CBC:  Recent Labs Lab 11/29/14 0505 12/01/14 0545 12/02/14 0427 12/03/14 0501 12/04/14 0517  WBC 9.4 10.4 7.4 7.0 6.6  HGB 8.8* 8.9* 9.6* 8.8* 9.5*  HCT 27.3* 28.5* 30.0* 27.3* 29.3*  MCV 92.5 96.3 92.3 91.6 91.6  PLT 229 236 235 225 236   CBG: No results for input(s): GLUCAP in the last 168 hours. Scheduled Meds: . cefTAZidime   IV  1 g Intravenous Q12H  . cycloSPORINE  1 drop Both Eyes BID  . enoxaparin  injection  30 mg Subcutaneous Q24H  . erythromycin  1 application Both Eyes QHS  . hydrocortisone  inj  20 mg Intravenous Daily  . ipratropium-albuterol  3 mL Nebulization BID  . metoprolol  10 mg Intravenous 4 times per day  . pantoprazole  IV  40 mg Intravenous QHS   Continuous Infusions: . feeding supplement (JEVITY 1.2 CAL)

## 2014-12-05 NOTE — Progress Notes (Signed)
Physical Therapy Treatment Patient Details Name: Julie Richardson MRN: [id number] DOB: 09/17/1925 Today's Date: 12/05/2014    History of Present Illness Pt is an 81 year old female s/p XI ROBOT ASSISTED SIGMOID COLECTOMY, REPAIR OF COLOVAGINAL FISTULA,RIGID PROCTOSCOPY on 11/28/14. 11/30/14: Pt with new onset of dysarthric speech since yesterday. Pt status post percutaneous gastrostomy tube on 10/13    PT Comments    Pt feeling weaker and more fatigued today.  Pt only able to tolerate ambulating 30 feet with a seated rest break.  Follow Up Recommendations  Home health PT;Supervision for mobility/OOB     Equipment Recommendations  None recommended by PT    Recommendations for Other Services       Precautions / Restrictions Precautions Precautions: Fall Precaution Comments: G tube    Mobility  Bed Mobility Overal bed mobility: Modified Independent             General bed mobility comments: increased time  Transfers Overall transfer level: Needs assistance Equipment used: Rolling walker (2 wheeled) Transfers: Sit to/from Stand Sit to Stand: Min assist         General transfer comment: verbal cues for hand placement, assist to rise and steady  Ambulation/Gait Ambulation/Gait assistance: Min assist Ambulation Distance (Feet): 30 Feet Assistive device: Rolling walker (2 wheeled) Gait Pattern/deviations: Step-through pattern;Decreased stride length     General Gait Details: pt reports feeling very tired and weak today limiting distance, required seated rest break after 15 feet, denies dizziness and pain, HR 115 bpm during gait, remained on O2 [place]   Stairs            Wheelchair Mobility    Modified Rankin (Stroke Patients Only)       Balance                                    Cognition Arousal/Alertness: Awake/alert Behavior During Therapy: WFL for tasks assessed/performed Overall Cognitive Status: Within Functional Limits for tasks  assessed                      Exercises      General Comments        Pertinent Vitals/Pain Pain Assessment: No/denies pain    Home Living                      Prior Function            PT Goals (current goals can now be found in the care plan section) Progress towards PT goals: Progressing toward goals (less progress today however s/p  G-tube since last visit)    Frequency  Min 3X/week    PT Plan Current plan remains appropriate    Co-evaluation             End of Session Equipment Utilized During Treatment: Oxygen Activity Tolerance: Patient limited by fatigue Patient left: with call bell/phone within reach;in bed;with bed alarm set     Time: 1348-1405 PT Time Calculation (min) (ACUTE ONLY): 17 min  Charges:  $Gait Training: 8-22 mins                    G Codes:      Zauria Dombek,KATHrine E 12/05/2014, 4:15 PM Zenovia JarredKati Minervia Osso, PT, DPT 12/05/2014 Pager: [phone]

## 2014-12-05 NOTE — Care Management Important Message (Signed)
Important Message  Patient Details  Name: Julie Richardson MRN: [id number] Date of Birth: 08/29/1925   Medicare Important Message Given:  Yes-third notification given    Haskell FlirtJamison, Perry Molla 12/05/2014, 1:04 PMImportant Message  Patient Details  Name: Julie Richardson MRN: [id number] Date of Birth: 05/15/1925   Medicare Important Message Given:  Yes-third notification given    Haskell FlirtJamison, Marijean Montanye 12/05/2014, 1:04 PM

## 2014-12-05 NOTE — Progress Notes (Signed)
CSW continuing to follow.   Pt plans to discharge to [facility] when medically ready in order for facility to assist with pt tube feedings.   [facility]. [facility] confirmed that facility can accept pt during the weekend if medically ready for discharge.  CSW to continue to follow to provide support and assist with pt disposition needs when pt medically ready.  Loletta SpecterSuzanna Kidd, MSW, LCSW Clinical Social Work [phone]

## 2014-12-05 NOTE — Progress Notes (Addendum)
Initial Nutrition Assessment  DOCUMENTATION CODES:   Non-severe (moderate) malnutrition in context of chronic illness  INTERVENTION:  - Will order Jevity 1.2 @ 45 mL/hr with 100 mL free water Q8h which will provide 1296 kcal, 60 grams protein, and 1171 mL free water. Initiate Jevity 1.2 @ 10 mL/hr and increase by 10 mL Q8h to reach goal rate of 45 mL/hr. - Monitor magnesium, potassium, and phosphorus daily for at least 3 days, MD to replete as needed, as pt is at risk for refeeding syndrome given prolonged period without nutrition. - RD will continue to monitor for needs  NUTRITION DIAGNOSIS:   Inadequate oral intake related to inability to eat as evidenced by NPO status.  GOAL:   Patient will meet greater than or equal to 90% of their needs  MONITOR:   TF tolerance, Weight trends, Labs, Skin, I & O's  REASON FOR ASSESSMENT:   Consult Enteral/tube feeding initiation and management  ASSESSMENT:   Pt with colovaginal fistula s/p robotic colectomy/repair 11/28/2014. She had swallowing difficulty x1-3 years PTA but after sugery 11/28/14 was unable to swallow. She subsequently agreed to PEG placement which was placed by IR 12/04/14.  Pt seen for RD to manage TF. BMI indicates normal weight status. Current weight inaccurate and weight from yesterday (41.5 kg) was used to calculate needs. Pt has been unable to consume PO x7 days due to swallowing difficulty. Current order in place for Jevity 1.2 @ 50 mL/hr which will provide 1440 kcal, 67 grams protein, and 968 mL free water which will exceed needs.  Will order TF as outlined above. Pt's son at bedside reports that pt's weight has been stable ~96 lbs for "a long time." Weight 12/03/14 was 97 lbs and on 12/04/14 was 91 lbs; will continue to monitor weight trends. Mild muscle and fat wasting noted to upper and lower body. Son also expresses concern for aspiration as pt has had several instances of aspiration PNA in the past.   Not meeting  needs. Medications reviewed. Labs reviewed; BUN <5, Ca: 8 mg/dL.   Diet Order:  Diet - low sodium heart healthy Diet NPO time specified Except for: Ice Chips  Skin:  Wound (see comment) (Incisions to rectum and abdomen)  Last BM:  10/10  Height:   Ht Readings from Last 1 Encounters:  11/28/14 4\' 11"  (1.499 m)    Weight:   Wt Readings from Last 1 Encounters:  12/05/14 160 lb 7.9 oz (72.8 kg)    Ideal Body Weight:  44.09 kg (kg)  BMI:  Body mass index is 32.4 kg/(m^2).  Estimated Nutritional Needs:   Kcal:  1160-1360  Protein:  47-60 grams  Fluid:  1.8-2 L/day  EDUCATION NEEDS:   No education needs identified at this time     Trenton GammonJessica Suhayb Anzalone, RD, LDN Inpatient Clinical Dietitian Pager # [phone] After hours/weekend pager # [phone]

## 2014-12-05 NOTE — Progress Notes (Addendum)
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  Barium swallow confirms this.    -ENT eval confirms.  Agree w Julie Richardson.   D/w pt & her son.    Pt needs gastric feeding tube to help reliably get nutrition & meds enterally.  I suspect dysphagia will be a permanent problem & will not fully recover - but could improve w ENT/SLP help.  Pt has friend w G tube that is active/functional, and she & her son are very open to the idea of a G tube.  Patient is still somewhat active & independent so they remain reasonably aggressive.    She dislikes the idea of an NGT or ND feeding tube.  I agree that nasal tubes are a poor long term solution.    I called [facility] Wed AM to do EGD to evaluate esophagus & stomach & then do PEG by EGD as well.   Dr Paulita Fujita to d/w Dr Penelope Coop.  Dr Cristina Gong eventually d/w the next day & they declined PEG placement  [facility] Dr Pascal Lux able to place 10/13  Meds & TFs being started via Gtube    -ileus resolved from colectomy/colovaginal fistula repair - adv TFs today  -pathology benign - d/w pt/son & copy of report in the  room  -hypoK - replaced  -hypoMag - replaced  -IVFs - wean as TFs start  -changed narcotics w bad dreams & mild grogginess - MS better  -inc HR controlled w IV metoprolol - switch back to eneteral sotolol  -HTN control - challenge w NPO.  switch back to eneteral sotolol  -chronic brochiectasis - switch back to enteral Azithromycin with h/o Pseudomonas bronchiectasis with WBC increasing   -anemia stable  -switch back to enteral prednisone  -VTE prophylaxis- SCDs, etc  -mobilize as tolerated to help recovery  D/C patient from hospital when patient meets criteria (anticipate in 2-3 day(s)):  Tolerating TFs well Ambulating well Adequate pain control without IV medications Urinating  Having flatus Disposition planning in place - plan to go to skilled nursing section of assisted living   I updated the patient's status to the patient & her RN.  Recommendations were made.  Questions were answered.  The patient & RN expressed understanding & appreciation.   Adin Hector, M.D., F.A.C.S. Gastrointestinal and Minimally Invasive Surgery [facility] [address] [place], [place] [address] [phone] Main / Paging   12/05/2014  Subjective:  Mildly groggy from Gtube placement yest Hungry RN just outside door  Objective:  Vital signs:  Filed Vitals:   12/04/14 1608 12/04/14 1635 12/04/14 2246 12/05/14 0602  BP: 161/85 104/56 105/67 114/62  Pulse: 113 102 95 95  Temp:  98.1 F (36.7 C)  97.4 F (36.3 C) 97.2 F (36.2 C)  TempSrc:  Oral Oral Oral  Resp: _0 Height:      Weight:    72.8 kg (160 lb 7.9 oz)  SpO2: 100% 93% 100% 100%    Last BM Date: 12/01/14  Intake/Output   Yesterday:  10/13 0701 - 10/14 0700 In: 1810 [I.V.:1810] Out: 600 [Urine:600] This shift:     Bowel function:  Flatus: y  BM: y  Drain: n/a  Physical Exam:  General: Pt awake/alert/oriented x4 in no acute distress.  Mildly sleepy  but wakes up well Eyes: PERRL, normal EOM.  Sclera clear.  No icterus Neuro: CN II-XII intact w/o focal sensory/motor deficits. Lymph: No head/neck/groin lymphadenopathy Psych:  No delerium/psychosis/paranoia HENT: Normocephalic, Mucus membranes dry.  No thrush.  Remains HOH Neck: Supple, No tracheal deviation Chest: No chest wall pain w good excursion CV:  Pulses intact.  Regular rhythm MS: Normal AROM mjr joints.  No obvious deformity Abdomen: Soft.  Nondistended.  Nontender at incisions.  No evidence of peritonitis.  No incarcerated hernias. Ext:  SCDs BLE.  No mjr edema.  No cyanosis Skin: No petechiae / purpura  Results:   Labs: Results for orders placed or performed during the hospital encounter of 11/28/14 (from the past 48 hour(s))  Lipase, blood     Status: None   Collection Time: 12/03/14  8:06 AM  Result Value Ref Range   Lipase 33 22 - 51 U/L  Hepatic function panel     Status: Abnormal   Collection Time: 12/03/14  8:06 AM  Result Value Ref Range   Total Protein 6.2 (L) 6.5 - 8.1 g/dL   Albumin 2.7 (L) 3.5 - 5.0 g/dL   AST 33 15 - 41 U/L   ALT 17 14 - 54 U/L   Alkaline Phosphatase 48 38 - 126 U/L   Total Bilirubin 0.9 0.3 - 1.2 mg/dL   Bilirubin, Direct 0.2 0.1 - 0.5 mg/dL   Indirect Bilirubin 0.7 0.3 - 0.9 mg/dL  Prealbumin     Status: Abnormal   Collection Time: 12/03/14  8:06 AM  Result Value Ref Range   Prealbumin 11.6 (L) 18 - 38 mg/dL    Comment: Performed at [facility]  CBC     Status: Abnormal   Collection Time: 12/04/14  5:17 AM  Result Value Ref Range   WBC 6.6 4.0 - 10.5 K/uL   RBC 3.20 (L) 3.87 - 5.11 MIL/uL   Hemoglobin 9.5 (L) 12.0 - 15.0 g/dL   HCT 29.3 (L) 36.0 - 46.0 %   MCV 91.6 78.0 - 100.0 fL   MCH 29.7 26.0 - 34.0 pg   MCHC 32.4 30.0 - 36.0 g/dL   RDW 14.8 11.5 - 15.5 %   Platelets 236 150 - 400 K/uL  Basic metabolic panel     Status: Abnormal   Collection Time: 12/04/14  5:17 AM  Result Value Ref Range   Sodium 135 135 -  145 mmol/L   Potassium 3.6 3.5 - 5.1 mmol/L   Chloride 101 101 - 111 mmol/L   CO2 28 22 - 32 mmol/L   Glucose, Bld 116 (H) 65 - 99 mg/dL   BUN <5 (L) 6 - 20 mg/dL   Creatinine, Ser 0.58 0.44 - 1.00 mg/dL   Calcium 8.0 (L) 8.9 - 10.3 mg/dL   GFR calc non Af Amer >60 >60 mL/min   GFR calc Af Amer >60 >60 mL/min    Comment: (  NOTE) The eGFR has been calculated using the CKD EPI equation. This calculation has not been validated in all clinical situations. eGFR's persistently <60 mL/min signify possible Chronic Kidney Disease.    Anion gap 6 5 - 15  Magnesium     Status: None   Collection Time: 12/04/14  5:17 AM  Result Value Ref Range   Magnesium 2.4 1.7 - 2.4 mg/dL  Protime-INR     Status: Abnormal   Collection Time: 12/04/14 11:45 AM  Result Value Ref Range   Prothrombin Time 16.3 (H) 11.6 - 15.2 seconds   INR 1.30 0.00 - 1.49  APTT     Status: None   Collection Time: 12/04/14 11:45 AM  Result Value Ref Range   aPTT 26 24 - 37 seconds  Creatinine, serum     Status: None   Collection Time: 12/05/14  5:23 AM  Result Value Ref Range   Creatinine, Ser 0.69 0.44 - 1.00 mg/dL   GFR calc non Af Amer >60 >60 mL/min   GFR calc Af Amer >60 >60 mL/min    Comment: (NOTE) The eGFR has been calculated using the CKD EPI equation. This calculation has not been validated in all clinical situations. eGFR's persistently <60 mL/min signify possible Chronic Kidney Disease.     Imaging / Studies: Ir Gastrostomy Tube Mod Sed  12/04/2014  INDICATION: Dysphagia/aspiration. In need of gastrostomy tube for enteric nutrition supplementation. EXAM: PULL TROUGH GASTROSTOMY TUBE PLACEMENT COMPARISON:  CT abdomen pelvis - 09/15/2014 MEDICATIONS: Patient is currently admitted to the hospital and receiving intravenous antibiotics.; Antibiotics were administered within 1 hour of the procedure. Glucagon 1 mg IV CONTRAST:  20 mL of Omnipaque 300 administered into the gastric lumen. ANESTHESIA/SEDATION: The  patient received conscious sedation with intravenous Versed and Fentanyl Sedation time 13 minutes FLUOROSCOPY TIME:  2 minutes 18 seconds (44.8 mGy) COMPLICATIONS: None immediate PROCEDURE: Informed written consent was obtained from the patient following explanation of the procedure, risks, benefits and alternatives. A time out was performed prior to the initiation of the procedure. Ultrasound scanning was performed to demarcate the edge of the left lobe of the liver. Maximal barrier sterile technique utilized including caps, mask, sterile gowns, sterile gloves, large sterile drape, hand hygiene and Betadine prep. The left upper quadrant was sterilely prepped and draped. An oral gastric catheter was inserted into the stomach under fluoroscopy. The existing nasogastric feeding tube was removed. The left costal margin and air opacified transverse colon were identified and avoided. Air was injected into the stomach for insufflation and visualization under fluoroscopy. Under sterile conditions a 17 gauge trocar needle was utilized to access the stomach percutaneously beneath the left subcostal margin after the overlying soft tissues were anesthetized with 1% Lidocaine with epinephrine. Needle position was confirmed within the stomach with aspiration of air and injection of small amount of contrast. A single T tack was deployed for gastropexy. Over an Amplatz guide wire, a 9-French sheath was inserted into the stomach. A snare device was utilized to capture the oral gastric catheter. The snare device was pulled retrograde from the stomach up the esophagus and out the oropharynx. The 20-French pull-through gastrostomy was connected to the snare device and pulled antegrade through the oropharynx down the esophagus into the stomach and then through the percutaneous tract external to the patient. The gastrostomy was assembled externally. Contrast injection confirms position in the stomach. Several spot radiographic images  were obtained in various obliquities for documentation. The patient tolerated procedure well without immediate post  procedural complication. FINDINGS: After successful fluoroscopic guided placement, the gastrostomy tube is appropriately positioned with internal disc against the ventral aspect of the gastric lumen. IMPRESSION: Successful fluoroscopic insertion of a 20-French pull-through gastrostomy tube. The gastrostomy may be used immediately for medication administration and in 24 hrs for the initiation of feeds. Electronically Signed   By: Sandi Mariscal M.D.   On: 12/04/2014 16:42    Medications / Allergies: per chart  Antibiotics: Anti-infectives    Start     Dose/Rate Route Frequency Ordered Stop   12/01/14 1500  cefTAZidime (FORTAZ) 1 g in dextrose 5 % 50 mL IVPB    Comments:  When taking PO well, switch to home azithromycin 212m PO daily   1 g 100 mL/hr over 30 Minutes Intravenous Every 12 hours 12/01/14 1332     11/28/14 2300  cefoTEtan (CEFOTAN) 2 g in dextrose 5 % 50 mL IVPB     2 g 100 mL/hr over 30 Minutes Intravenous Every 12 hours 11/28/14 1736 11/28/14 2330   11/28/14 1532  clindamycin (CLEOCIN) 900 mg, gentamicin (GARAMYCIN) 240 mg in sodium chloride 0.9 % 1,000 mL for intraperitoneal lavage  Status:  Discontinued       As needed 11/28/14 1532 11/28/14 1535   11/28/14 1006  cefoTEtan (CEFOTAN) 2 g in dextrose 5 % 50 mL IVPB     2 g 100 mL/hr over 30 Minutes Intravenous On call to O.R. 11/28/14 1006 11/28/14 1305   11/28/14 0600  clindamycin (CLEOCIN) 900 mg, gentamicin (GARAMYCIN) 240 mg in sodium chloride 0.9 % 1,000 mL for intraperitoneal lavage  Status:  Discontinued    Comments:  Pharmacy may adjust dosing strength, schedule, rate of infusion, etc as needed to optimize therapy    Intraperitoneal To Surgery 11/27/14 1323 11/28/14 1643        Note: Portions of this report may have been transcribed using voice recognition software. Every effort was made to ensure  accuracy; however, inadvertent computerized transcription errors may be present.   Any transcriptional errors that result from this process are unintentional.     SAdin Hector M.D., F.A.C.S. Gastrointestinal and Minimally Invasive Surgery [facility] [address] [place] [phone] / Paging   12/05/2014  CARE TEAM:  PCP: PWillodean Rosenthal MD  Outpatient Care Team: Patient Care Team: JWillodean Rosenthal MD as PCP - General (Internal Medicine) SBjorn Loser MD as Consulting Physician (Urology) SMichael Boston MD as Consulting Physician (General Surgery) JTeena Irani MD as Consulting Physician (Gastroenterology)  Inpatient Treatment Team: Treatment Team: Attending Provider: SMichael Boston MD; Registered Nurse: LBailey Mech RN; Technician: KCarlisle Beers NT; Registered Nurse: FEli Hose RN; Rounding Team: WGarner Gavel MD; Consulting Physician: SWonda Horner MD; Technician: JGeoffery Spruce NT; Technician: RMilagros Reap NT; Consulting Physician: WArta Silence MD; Respiratory Therapist: TNelly Laurence RRT; Technician: WMart Piggs NT; Physical Therapist: KJunius Argyle PT

## 2014-12-05 NOTE — Progress Notes (Signed)
Patient ID: Julie Richardson, female   DOB: 08/15/1925, 81 y.o.   MRN: [id number]    Referring Physician(s): CCS/TRH  Chief Complaint: Dysphagia, protein calorie malnutrition   Subjective: Patient drowsy and weak, states she is hungry; has some soreness at G-tube site as expected. Denies nausea /vomiting.   Allergies: Coumadin and Sulfa antibiotics  Medications: Prior to Admission medications   Medication Sig Start Date End Date Taking? Authorizing Provider  acetaminophen (TYLENOL) 325 MG tablet Take 650 mg by mouth 2 (two) times daily.    Yes Historical Provider, MD  Ascorbic Acid (VITAMIN C) 1000 MG tablet Take 1,000 mg by mouth 2 (two) times daily.   Yes Historical Provider, MD  atorvastatin (LIPITOR) 20 MG tablet Take 20 mg by mouth at bedtime.    Yes Historical Provider, MD  azithromycin (ZITHROMAX) 250 MG tablet Take 250 mg by mouth daily. For chronic obstructive pulmonary disease   Yes Historical Provider, MD  bisacodyl (DULCOLAX) 5 MG EC tablet Take 5 mg by mouth 2 (two) times daily.   Yes Historical Provider, MD  cholecalciferol (VITAMIN D) 1000 UNITS tablet Take 2,000 Units by mouth daily.   Yes Historical Provider, MD  cycloSPORINE (RESTASIS) 0.05 % ophthalmic emulsion Place 1 drop into both eyes 2 (two) times daily.   Yes Historical Provider, MD  erythromycin ophthalmic ointment Place 1 application into both eyes at bedtime.   Yes Historical Provider, MD  ipratropium-albuterol (DUONEB) 0.5-2.5 (3) MG/3ML SOLN Take 3 mLs by nebulization 3 (three) times daily.   Yes Historical Provider, MD  omeprazole (PRILOSEC) 20 MG capsule Take 20 mg by mouth daily.   Yes Historical Provider, MD  Polyethyl Glycol-Propyl Glycol (LUBRICANT EYE DROPS) 0.4-0.3 % SOLN Apply 1 drop to eye as needed (for dry eyes).   Yes Historical Provider, MD  polyethylene glycol (MIRALAX / GLYCOLAX) packet Take 17 g by mouth daily.   Yes Historical Provider, MD  predniSONE (DELTASONE) 5 MG tablet Take 5 mg by  mouth daily with breakfast.   Yes Historical Provider, MD  sotalol (BETAPACE) 80 MG tablet Take 80 mg by mouth 2 (two) times daily.   Yes Historical Provider, MD  hydrocortisone (ANUSOL-HC) 2.5 % rectal cream Place 1 application rectally as needed for hemorrhoids or itching.    Historical Provider, MD  hydroxypropyl methylcellulose / hypromellose (ISOPTO TEARS / GONIOVISC) 2.5 % ophthalmic solution Place 1 drop into both eyes as needed for dry eyes.    Historical Provider, MD  traMADol (ULTRAM) 50 MG tablet Take 1-2 tablets (50-100 mg total) by mouth every 6 (six) hours as needed for moderate pain or severe pain. 11/28/14   Karie Soda, MD     Vital Signs: BP 114/62 mmHg  Pulse 110  Temp(Src) 97.2 F (36.2 C) (Oral)  Resp 18  Ht  (1.499 m)  Wt 160 lb 7.9 oz (72.8 kg)  BMI 32.40 kg/m2  SpO2 95%  Physical Exam gastrostomy tube site small amount of blood around gauze. Tube intact; site mildly tender to palpation; abdomen soft  Imaging: Dg Chest 2 View  12/02/2014  CLINICAL DATA:  Dysphasia.  Shortness of breath. EXAM: CHEST  2 VIEW COMPARISON:  None. FINDINGS: Mediastinum and hilar structures are normal. Cardiomegaly with mild pulmonary vascular prominence and interstitial prominence with small pleural effusions. These findings suggest mild congestive heart failure. Bibasilar subsegmental atelectasis and/or scarring. No acute bony abnormality. IMPRESSION: Cardiomegaly with mild pulmonary vascular prominence and interstitial prominence with small bilateral pleural effusions consistent with mild congestive  heart failure. Electronically Signed   By: Maisie Fushomas  Register   On: 12/02/2014 09:56   Ir Gastrostomy Tube Mod Sed  12/04/2014  INDICATION: Dysphagia/aspiration. In need of gastrostomy tube for enteric nutrition supplementation. EXAM: PULL TROUGH GASTROSTOMY TUBE PLACEMENT COMPARISON:  CT abdomen pelvis - 09/15/2014 MEDICATIONS: Patient is currently admitted to the hospital and receiving  intravenous antibiotics.; Antibiotics were administered within 1 hour of the procedure. Glucagon 1 mg IV CONTRAST:  20 mL of Omnipaque 300 administered into the gastric lumen. ANESTHESIA/SEDATION: The patient received conscious sedation with intravenous Versed and Fentanyl Sedation time 13 minutes FLUOROSCOPY TIME:  2 minutes 18 seconds (17.9 mGy) COMPLICATIONS: None immediate PROCEDURE: Informed written consent was obtained from the patient following explanation of the procedure, risks, benefits and alternatives. A time out was performed prior to the initiation of the procedure. Ultrasound scanning was performed to demarcate the edge of the left lobe of the liver. Maximal barrier sterile technique utilized including caps, mask, sterile gowns, sterile gloves, large sterile drape, hand hygiene and Betadine prep. The left upper quadrant was sterilely prepped and draped. An oral gastric catheter was inserted into the stomach under fluoroscopy. The existing nasogastric feeding tube was removed. The left costal margin and air opacified transverse colon were identified and avoided. Air was injected into the stomach for insufflation and visualization under fluoroscopy. Under sterile conditions a 17 gauge trocar needle was utilized to access the stomach percutaneously beneath the left subcostal margin after the overlying soft tissues were anesthetized with 1% Lidocaine with epinephrine. Needle position was confirmed within the stomach with aspiration of air and injection of small amount of contrast. A single T tack was deployed for gastropexy. Over an Amplatz guide wire, a 9-French sheath was inserted into the stomach. A snare device was utilized to capture the oral gastric catheter. The snare device was pulled retrograde from the stomach up the esophagus and out the oropharynx. The 20-French pull-through gastrostomy was connected to the snare device and pulled antegrade through the oropharynx down the esophagus into the  stomach and then through the percutaneous tract external to the patient. The gastrostomy was assembled externally. Contrast injection confirms position in the stomach. Several spot radiographic images were obtained in various obliquities for documentation. The patient tolerated procedure well without immediate post procedural complication. FINDINGS: After successful fluoroscopic guided placement, the gastrostomy tube is appropriately positioned with internal disc against the ventral aspect of the gastric lumen. IMPRESSION: Successful fluoroscopic insertion of a 20-French pull-through gastrostomy tube. The gastrostomy may be used immediately for medication administration and in 24 hrs for the initiation of feeds. Electronically Signed   By: Simonne ComeJohn  Watts M.D.   On: 12/04/2014 16:42   Dg Esophagus  12/02/2014  CLINICAL DATA:  Dysphagia. Aspiration on swallowing function study yesterday. History of aspiration pneumonia. EXAM: ESOPHOGRAM/BARIUM SWALLOW TECHNIQUE: Single contrast examination was performed using thin and thick barium. FLUOROSCOPY TIME:  Radiation Exposure Index (as provided by the fluoroscopic device): If the device does not provide the exposure index: Fluoroscopy Time:  2 minutes 5 seconds Number of Acquired Images: COMPARISON:  None. FINDINGS: Abnormal pharyngeal phase of swallowing. There was pooling of thick and thin barium in the valleculae and piriform sinuses. The patient had difficulty coordinating swallowing. Patient had difficulty moving the bolus into the esophagus. Initial swallows with thin barium revealed penetration without aspiration. Additional thick barium swallows progressed to frank aspiration of a mild amount. The esophageal motility is diminished. No stricture or mass. Limited evaluation of the esophagus  due to limited barium in the esophagus. IMPRESSION: Poor esophageal motility with difficulty propelling the bolus into the esophagus. There is pooling in the pharynx. There was  aspiration of thick barium. There was penetration with thin barium. Limited evaluation the esophagus due to small boluses. There is esophageal dysmotility. No stricture or obstruction of the esophagus. Electronically Signed   By: Marlan Palau M.D.   On: 12/02/2014 11:40   Dg Swallowing Func-speech Pathology  12/01/2014  Objective Swallowing Evaluation:   MBS Patient Details Name: Julie Richardson MRN: [id number] Date of Birth: Sep 15, 1925 Today's Date: 12/01/2014 Time: SLP Start Time (ACUTE ONLY): 1348-SLP Stop Time (ACUTE ONLY): 1406 SLP Time Calculation (min) (ACUTE ONLY): 18 min Past Medical History: Past Medical History Diagnosis Date . HOH (hard of hearing)  . Anemia  . Diabetes mellitus without complication (HCC)  . History of home oxygen therapy    uses 2 liters at hs and prn during day . Atrial fibrillation (HCC)    hx of . Spinal stenosis  . GERD (gastroesophageal reflux disease)  . Chronic kidney disease    stage 3 . Arthritis  . Dysphagia    uses thickened liquids . Hyperlipidemia  . Pelvis fracture (HCC) 6 years ago . Complication of anesthesia    slow to wake up . Blind left eye  . HOH (hard of hearing)  . TIA (transient ischemic attack)    7 YEARS AGO, NONE SINCE . Pneumonia oct 2015   ASPIRATION PNEUMONIA X 3- 4 TIMES IN PAST . COPD (chronic obstructive pulmonary disease) (HCC)    pseodonomonus bronchiectasis Past Surgical History: Past Surgical History Procedure Laterality Date . Abdominal hysterectomy   . Right shoulder surgery  years ago   for fracture . Tonsillectomy  as child HPI: Other Pertinent Information: Pt is s/p XI robot assisted SIGMOID COLECTOMY, REPAIR OF COLOVAGINAL FISTULA,RIGID PROCTOSCOPY.   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 (overnight in 08/2013).  Pt reports using nectar thick liquids over the last year or so.  Swallow eval ordered as pt reports difficulty swallowing medicine - even when crushed.    No Data Recorded Assessment / Plan / Recommendation CHL IP CLINICAL IMPRESSIONS 12/01/2014 Therapy Diagnosis Suspected primary esophageal dysphagia;Mild pharyngeal phase dysphagia Clinical Impression Patient presents with a suspected primary esophageal dysphagia with mild pharyngeal component. Oral phase WFL. Timing of swallow adequate. Anterior hyo-laryngeal movement appearing mildly reduced by not significant enough to account for severely impaired opening of the UES. Boluses of all consistencies trialed sit in pyriform sinuses post swallow with little-no clearance of bolus into the esophagus. SLP instructed patient for a variety of head postures (head turns, tilts, and chin tucks) without change in function. Patient with eventual penetration and aspiration of consistencies provided with intact sensation and clearance of airway but inability to clear via esophagus requiring expectoration of bolus. Swallow is at this time non-functional given little-no clearance of bolus into esophagus. Per patient and MBS note from 2015, dysphagia only mild and functional. Patient was consuming po prior to admission. Of note, discussion with Dr. Jomarie Longs revealed that patient has had multiple recent admissions to [facility] for aspiration PNA. At this time, while brief intubation and deconditioning may account for some decline in dysfunction, question if primary esophageal deficit is to blame for current level of dysfunction. Unclear baseline further complicates diagnostics. ENT or GI consult may be beneficial in ruling out primary UES dysfunction  and determining potential for intervention to improve. SLP will continue to f/u at bedside to monitor for a facilitate improved swallowing function, assisting to determine potential to resume a po diet.    CHL IP TREATMENT RECOMMENDATION 12/01/2014 Treatment Recommendations Therapy as outlined in treatment plan below   CHL IP DIET RECOMMENDATION 12/01/2014 SLP Diet  Recommendations Ice chips PRN after oral care;NPO Liquid Administration via (None) Medication Administration Via alternative means Compensations (None) Postural Changes and/or Swallow Maneuvers (None)   CHL IP OTHER RECOMMENDATIONS 12/01/2014 Recommended Consults Consider ENT evaluation;Consider GI evaluation Oral Care Recommendations Oral care QID Other Recommendations (None)     CHL IP FREQUENCY AND DURATION 12/01/2014 Speech Therapy Frequency (ACUTE ONLY) min 1 x/week Treatment Duration 1 week       CHL IP REASON FOR REFERRAL 12/01/2014 Reason for Referral Objectively evaluate swallowing function       CHL IP GO 09/18/2013 Functional Assessment Tool Used skilled clinical judgment Functional Limitations Swallowing Swallow Current Status (Z6109) CJ Swallow Goal Status (U0454) CJ Swallow Discharge Status (U9811) CJ Motor Speech Current Status (B1478) (None) Motor Speech Goal Status (G9562) (None) Motor Speech Goal Status (Z3086) (None) Spoken Language Comprehension Current Status (V7846) (None) Spoken Language Comprehension Goal Status (N6295) (None) Spoken Language Comprehension Discharge Status [phone]) (None) Spoken Language Expression Current Status (K4401) (None) Spoken Language Expression Goal Status (U2725) (None) Spoken Language Expression Discharge Status [phone]) (None) Attention Current Status (I3474) (None) Attention Goal Status (Q5956) (None) Attention Discharge Status (L8756) (None) Memory Current Status (E3329) (None) Memory Goal Status (J1884) (None) Memory Discharge Status (Z6606) (None) Voice Current Status (T0160) (None) Voice Goal Status (F0932) (None) Voice Discharge Status (T5573) (None) Other Speech-Language Pathology Functional Limitation [phone]) (None) Other Speech-Language Pathology Functional Limitation Goal Status (K2706) (None) Other Speech-Language Pathology Functional Limitation Discharge Status [phone]) (None) Ferdinand Lango MA, CCC-SLP [phone]   McCoy Leah Meryl 12/01/2014, 2:37 PM     Labs:  CBC:  Recent Labs  12/01/14 0545 12/02/14 0427 12/03/14 0501 12/04/14 0517  WBC 10.4 7.4 7.0 6.6  HGB 8.9* 9.6* 8.8* 9.5*  HCT 28.5* 30.0* 27.3* 29.3*  PLT 236 235 225 236    COAGS:  Recent Labs  12/04/14 1145  INR 1.30  APTT 26    BMP:  Recent Labs  12/01/14 0545 12/02/14 0427 12/03/14 0501 12/04/14 0517 12/05/14 0523  NA 140 136 139 135  --   K 3.5 3.0* 3.0* 3.6  --   CL 109 104 105 101  --   CO2 18* 25 26 28   --   GLUCOSE 52* 157* 137* 116*  --   BUN 20 11 8  <5*  --   CALCIUM 8.4* 8.2* 8.1* 8.0*  --   CREATININE 0.78 0.73 0.60 0.58 0.69  GFRNONAA >60 >60 >60 >60 >60  GFRAA >60 >60 >60 >60 >60    LIVER FUNCTION TESTS:  Recent Labs  04/26/14 0800 12/03/14 0806  BILITOT 1.2 0.9  AST 41* 33  ALT 27 17  ALKPHOS 117 48  PROT 7.9 6.2*  ALBUMIN 3.4* 2.7*    Assessment and Plan: Patient with dysphagia/recurrent aspiration and protein calorie malnutrition, status post percutaneous gastrostomy tube on 10/13. Okay to use tube for feeds. Patient afebrile. Labs pending today.   Signed: D. Jeananne Rama 12/05/2014, 12:13 PM   I spent a total of 15 minutes at the the patient's bedside AND on the patient's hospital floor or unit, greater than 50% of which was counseling/coordinating care for gastrostomy tube

## 2014-12-06 DIAGNOSIS — D62 Acute posthemorrhagic anemia: Secondary | ICD-10-CM

## 2014-12-06 DIAGNOSIS — I481 Persistent atrial fibrillation: Secondary | ICD-10-CM

## 2014-12-06 DIAGNOSIS — J449 Chronic obstructive pulmonary disease, unspecified: Secondary | ICD-10-CM

## 2014-12-06 DIAGNOSIS — N824 Other female intestinal-genital tract fistulae: Secondary | ICD-10-CM

## 2014-12-06 LAB — CBC
HCT: 19.4 % — ABNORMAL LOW (ref 36.0–46.0)
HCT: 31.4 % — ABNORMAL LOW (ref 36.0–46.0)
HEMOGLOBIN: 6 g/dL — AB (ref 12.0–15.0)
Hemoglobin: 10.2 g/dL — ABNORMAL LOW (ref 12.0–15.0)
MCH: 29.3 pg (ref 26.0–34.0)
MCH: 29.2 pg (ref 26.0–34.0)
MCHC: 30.9 g/dL (ref 30.0–36.0)
MCHC: 32.5 g/dL (ref 30.0–36.0)
MCV: 94.6 fL (ref 78.0–100.0)
MCV: 90 fL (ref 78.0–100.0)
PLATELETS: 173 10*3/uL (ref 150–400)
Platelets: 226 10*3/uL (ref 150–400)
RBC: 2.05 MIL/uL — AB (ref 3.87–5.11)
RBC: 3.49 MIL/uL — AB (ref 3.87–5.11)
RDW: 16.4 % — ABNORMAL HIGH (ref 11.5–15.5)
RDW: 16.2 % — AB (ref 11.5–15.5)
WBC: 9.5 10*3/uL (ref 4.0–10.5)
WBC: 6.1 10*3/uL (ref 4.0–10.5)

## 2014-12-06 LAB — GLUCOSE, CAPILLARY
GLUCOSE-CAPILLARY: 135 mg/dL — AB (ref 65–99)
GLUCOSE-CAPILLARY: 144 mg/dL — AB (ref 65–99)
Glucose-Capillary: 124 mg/dL — ABNORMAL HIGH (ref 65–99)
Glucose-Capillary: 121 mg/dL — ABNORMAL HIGH (ref 65–99)
Glucose-Capillary: 127 mg/dL — ABNORMAL HIGH (ref 65–99)
Glucose-Capillary: 155 mg/dL — ABNORMAL HIGH (ref 65–99)

## 2014-12-06 LAB — BASIC METABOLIC PANEL
ANION GAP: 7 (ref 5–15)
BUN: 36 mg/dL — ABNORMAL HIGH (ref 6–20)
CHLORIDE: 105 mmol/L (ref 101–111)
CO2: 26 mmol/L (ref 22–32)
Calcium: 8.7 mg/dL — ABNORMAL LOW (ref 8.9–10.3)
Creatinine, Ser: 0.78 mg/dL (ref 0.44–1.00)
GFR calc non Af Amer: >60 mL/min (ref >60)
Glucose, Bld: 145 mg/dL — ABNORMAL HIGH (ref 65–99)
POTASSIUM: 4.2 mmol/L (ref 3.5–5.1)
Sodium: 138 mmol/L (ref 135–145)

## 2014-12-06 LAB — MAGNESIUM: Magnesium: 2 mg/dL (ref 1.7–2.4)

## 2014-12-06 LAB — MRSA PCR SCREENING: MRSA by PCR: POSITIVE — AB

## 2014-12-06 LAB — PREPARE RBC (CROSSMATCH)

## 2014-12-06 MED ORDER — PANTOPRAZOLE SODIUM 40 MG PO PACK
80.0000 mg | PACK | Freq: Every day | ORAL | Status: DC
  Administered 2014-12-06 – 2014-12-10 (×5): 80 mg
  Filled 2014-12-06 (×6): qty 40

## 2014-12-06 MED ORDER — HYDROCORTISONE NA SUCCINATE PF 100 MG IJ SOLR
100.0000 mg | Freq: Three times a day (TID) | INTRAMUSCULAR | Status: DC
  Administered 2014-12-06 – 2014-12-07 (×3): 100 mg via INTRAVENOUS
  Filled 2014-12-06 (×3): qty 2

## 2014-12-06 MED ORDER — SODIUM CHLORIDE 0.9 % IV SOLN
Freq: Once | INTRAVENOUS | Status: AC
  Administered 2014-12-06: 08:00:00 via INTRAVENOUS

## 2014-12-06 MED ORDER — AMIODARONE HCL IN DEXTROSE 360-4.14 MG/200ML-% IV SOLN: 30.0000 mg/h | INTRAVENOUS | Status: DC

## 2014-12-06 MED ORDER — AMIODARONE IV BOLUS ONLY 150 MG/100ML
150.0000 mg | Freq: Once | INTRAVENOUS | Status: AC
  Administered 2014-12-06: 150 mg via INTRAVENOUS
  Filled 2014-12-06: qty 100

## 2014-12-06 MED ORDER — AMIODARONE HCL IN DEXTROSE 360-4.14 MG/200ML-% IV SOLN
60.0000 mg/h | INTRAVENOUS | Status: AC
  Administered 2014-12-06: 60 mg/h via INTRAVENOUS
  Filled 2014-12-06: qty 200

## 2014-12-06 MED ORDER — MUPIROCIN 2 % EX OINT
1.0000 "application " | TOPICAL_OINTMENT | Freq: Two times a day (BID) | CUTANEOUS | Status: DC
  Administered 2014-12-07 – 2014-12-10 (×8): 1 via NASAL
  Filled 2014-12-06 (×2): qty 22

## 2014-12-06 MED ORDER — AMIODARONE LOAD VIA INFUSION: 150.0000 mg | Freq: Once | INTRAVENOUS | Status: DC

## 2014-12-06 MED ORDER — AMIODARONE HCL IN DEXTROSE 360-4.14 MG/200ML-% IV SOLN
30.0000 mg/h | INTRAVENOUS | Status: DC
  Administered 2014-12-06 – 2014-12-09 (×6): 30 mg/h via INTRAVENOUS
  Filled 2014-12-06 (×6): qty 200

## 2014-12-06 MED ORDER — CHLORHEXIDINE GLUCONATE CLOTH 2 % EX PADS
6.0000 | MEDICATED_PAD | Freq: Every day | CUTANEOUS | Status: DC
  Administered 2014-12-07 – 2014-12-10 (×4): 6 via TOPICAL

## 2014-12-06 MED ORDER — AMIODARONE HCL IN DEXTROSE 360-4.14 MG/200ML-% IV SOLN: 60.0000 mg/h | INTRAVENOUS | Status: DC

## 2014-12-06 NOTE — Consult Note (Signed)
Primary Physician: Primary Cardiologist:  New   Asked to see re SVT  HPI: Pt is a 81 yo with history of colovaginal fistula.  Admitted for surgery.  Now post op Day 8   Also  ahistoryo fo CVA, RA (on prednisone), PAF (remote, not on anticoag),  Bronchiectasis, dysphgia   Today developed AFib with RVR and  SVT.  Now back in Afib   BP has been low all day, now at 72/49   Hg 6.0  2 U PRBC written for  (Hgb was 9 2 days ago)  Receiving IV fluids and IV amiodarone.   Plan to tx to ICU  The patient says she has noted an occasional fultter of her heart in past  Nothing serious Denies CP  No palpitations.          Past Medical History  Diagnosis Date  . HOH (hard of hearing)   . Anemia   . Diabetes mellitus without complication (HCC)   . History of home oxygen therapy     uses 2 liters at hs and prn during day  . Atrial fibrillation (HCC)     hx of  . Spinal stenosis   . GERD (gastroesophageal reflux disease)   . Chronic kidney disease     stage 3  . Arthritis   . Dysphagia     uses thickened liquids  . Hyperlipidemia   . Pelvis fracture (HCC) 6 years ago  . Complication of anesthesia     slow to wake up  . Blind left eye   . HOH (hard of hearing)   . TIA (transient ischemic attack)     7 YEARS AGO, NONE SINCE  . Pneumonia oct 2015    ASPIRATION PNEUMONIA X 3- 4 TIMES IN PAST  . COPD (chronic obstructive pulmonary disease) (HCC)     pseodonomonus bronchiectasis    Medications Prior to Admission  Medication Sig Dispense Refill  . acetaminophen (TYLENOL) 325 MG tablet Take 650 mg by mouth 2 (two) times daily.     . Ascorbic Acid (VITAMIN C) 1000 MG tablet Take 1,000 mg by mouth 2 (two) times daily.    Marland Kitchen atorvastatin (LIPITOR) 20 MG tablet Take 20 mg by mouth at bedtime.     Marland Kitchen azithromycin (ZITHROMAX) 250 MG tablet Take 250 mg by mouth daily. For chronic obstructive pulmonary disease    . bisacodyl (DULCOLAX) 5 MG EC tablet Take 5 mg by mouth 2 (two) times daily.      . cholecalciferol (VITAMIN D) 1000 UNITS tablet Take 2,000 Units by mouth daily.    . cycloSPORINE (RESTASIS) 0.05 % ophthalmic emulsion Place 1 drop into both eyes 2 (two) times daily.    Marland Kitchen erythromycin ophthalmic ointment Place 1 application into both eyes at bedtime.    Marland Kitchen ipratropium-albuterol (DUONEB) 0.5-2.5 (3) MG/3ML SOLN Take 3 mLs by nebulization 3 (three) times daily.    Marland Kitchen omeprazole (PRILOSEC) 20 MG capsule Take 20 mg by mouth daily.    Bertram Gala Glycol-Propyl Glycol (LUBRICANT EYE DROPS) 0.4-0.3 % SOLN Apply 1 drop to eye as needed (for dry eyes).    . polyethylene glycol (MIRALAX / GLYCOLAX) packet Take 17 g by mouth daily.    . predniSONE (DELTASONE) 5 MG tablet Take 5 mg by mouth daily with breakfast.    . sotalol (BETAPACE) 80 MG tablet Take 80 mg by mouth 2 (two) times daily.    . [DISCONTINUED] metroNIDAZOLE (FLAGYL) 500 MG tablet Take 500 mg by  mouth See admin instructions. Take at 2 pm, 3 pm and 10 pm the day before surgery    . [DISCONTINUED] neomycin (MYCIFRADIN) 500 MG tablet Take 1,000 mg by mouth See admin instructions. Take at pm, 3 pm, and 10 pm the day before surgery    . hydrocortisone (ANUSOL-HC) 2.5 % rectal cream Place 1 application rectally as needed for hemorrhoids or itching.    . hydroxypropyl methylcellulose / hypromellose (ISOPTO TEARS / GONIOVISC) 2.5 % ophthalmic solution Place 1 drop into both eyes as needed for dry eyes.       Marland Kitchen antiseptic oral rinse  15 mL Mouth Rinse TID  . atorvastatin  20 mg Oral QHS  . azithromycin  250 mg Oral Daily  . cholecalciferol  2,000 Units Oral Daily  . cycloSPORINE  1 drop Both Eyes BID  . diltiazem  10 mg Intravenous Once  . erythromycin  1 application Both Eyes QHS  . free water  100 mL Per Tube 3 times per day  . ipratropium-albuterol  3 mL Nebulization BID  . lip balm  1 application Topical BID  . metoCLOPramide  10 mg Per Tube 4 times per day  . pantoprazole sodium  80 mg Per Tube Daily  . predniSONE  5 mg  Oral Q breakfast  . RESOURCE THICKENUP CLEAR   Oral TID WC  . saccharomyces boulardii  250 mg Oral BID  . sodium chloride  3 mL Intravenous Q12H  . sodium chloride  3 mL Intravenous Q12H  . sotalol  80 mg Oral BID  . vitamin C  500 mg Oral BID    Infusions: . amiodarone    . amiodarone    . feeding supplement (JEVITY 1.2 CAL) 1,000 mL (12/05/14 1219)    Allergies  Allergen Reactions  . Coumadin [Warfarin Sodium] Other (See Comments)    Rectal bleeding   . Sulfa Antibiotics Rash    Social History   Social History  . Marital Status: Widowed    Spouse Name: N/A  . Number of Children: N/A  . Years of Education: N/A   Occupational History  . Not on file.   Social History Main Topics  . Smoking status: Never Smoker   . Smokeless tobacco: Never Used  . Alcohol Use: No  . Drug Use: No  . Sexual Activity: Not on file   Other Topics Concern  . Not on file   Social History Narrative    History reviewed. No pertinent family history.  REVIEW OF SYSTEMS:  All systems reviewed  Negative to the above problem except as noted above.    PHYSICAL EXAM: Filed Vitals:   12/06/14 1456  BP: 78/53  Pulse: 156  Temp:   Resp:      Intake/Output Summary (Last 24 hours) at 12/06/14 1534 Last data filed at 12/06/14 1345  Gross per 24 hour  Intake    585 ml  Output      0 ml  Net    585 ml    General:  Pale 81 yo   Thin  In NAD HEENT: normal Neck: supple. no JVD. Carotids 2+ bilat; no bruits. No lymphadenopathy or thryomegaly appreciated. Cor: PMI nondisplaced. Irregular rate & rhythm. No rubs, gallops or murmurs. Lungs: Mild rhonchi Abdomen: Decreased BS  Sl distendendd  Supple  Extremities: no cyanosis, clubbing, rash, edema Neuro: alert & oriented x 3, cranial nerves grossly intact. moves all 4 extremities w/o difficulty. Affect pleasant.  ECG:  SVT 171 bpm  Results for orders placed or performed during the hospital encounter of 11/28/14 (from the past 24 hour(s))   Glucose, capillary     Status: Abnormal   Collection Time: 12/05/14  7:45 PM  Result Value Ref Range   Glucose-Capillary 121 (H) 65 - 99 mg/dL  Glucose, capillary     Status: Abnormal   Collection Time: 12/06/14 12:08 AM  Result Value Ref Range   Glucose-Capillary 124 (H) 65 - 99 mg/dL  Glucose, capillary     Status: Abnormal   Collection Time: 12/06/14  4:34 AM  Result Value Ref Range   Glucose-Capillary 135 (H) 65 - 99 mg/dL  Basic metabolic panel     Status: Abnormal   Collection Time: 12/06/14  6:30 AM  Result Value Ref Range   Sodium 138 135 - 145 mmol/L   Potassium 4.2 3.5 - 5.1 mmol/L   Chloride 105 101 - 111 mmol/L   CO2 26 22 - 32 mmol/L   Glucose, Bld 145 (H) 65 - 99 mg/dL   BUN 36 (H) 6 - 20 mg/dL   Creatinine, Ser 4.090.78 0.44 - 1.00 mg/dL   Calcium 8.7 (L) 8.9 - 10.3 mg/dL   GFR calc non Af Amer >60 >60 mL/min   GFR calc Af Amer >60 >60 mL/min   Anion gap 7 5 - 15  CBC     Status: Abnormal   Collection Time: 12/06/14  6:30 AM  Result Value Ref Range   WBC 9.5 4.0 - 10.5 K/uL   RBC 2.05 (L) 3.87 - 5.11 MIL/uL   Hemoglobin 6.0 (LL) 12.0 - 15.0 g/dL   HCT 81.119.4 (L) 91.436.0 - 78.246.0 %   MCV 94.6 78.0 - 100.0 fL   MCH 29.3 26.0 - 34.0 pg   MCHC 30.9 30.0 - 36.0 g/dL   RDW 95.616.4 (H) 21.311.5 - 08.615.5 %   Platelets 226 150 - 400 K/uL  Magnesium     Status: None   Collection Time: 12/06/14  6:30 AM  Result Value Ref Range   Magnesium 2.0 1.7 - 2.4 mg/dL  Prepare RBC     Status: None   Collection Time: 12/06/14  7:39 AM  Result Value Ref Range   Order Confirmation ORDER PROCESSED BY BLOOD BANK   Glucose, capillary     Status: Abnormal   Collection Time: 12/06/14  7:39 AM  Result Value Ref Range   Glucose-Capillary 127 (H) 65 - 99 mg/dL  Type and screen [facility]     Status: None (Preliminary result)   Collection Time: 12/06/14  8:09 AM  Result Value Ref Range   ABO/RH(D) O POS    Antibody Screen NEG    Sample Expiration 12/09/2014    Unit Number  [id number]    Blood Component Type RED CELLS,LR    Unit division 00    Status of Unit ISSUED    Transfusion Status OK TO TRANSFUSE    Crossmatch Result Compatible    Unit Number [id number]    Blood Component Type RBC LR PHER2    Unit division 00    Status of Unit ALLOCATED    Transfusion Status OK TO TRANSFUSE    Crossmatch Result Compatible   Glucose, capillary     Status: Abnormal   Collection Time: 12/06/14 11:51 AM  Result Value Ref Range   Glucose-Capillary 155 (H) 65 - 99 mg/dL   Ir Gastrostomy Tube Mod Sed  12/04/2014  INDICATION: Dysphagia/aspiration. In need of gastrostomy tube for enteric nutrition supplementation.  EXAM: PULL TROUGH GASTROSTOMY TUBE PLACEMENT COMPARISON:  CT abdomen pelvis - 09/15/2014 MEDICATIONS: Patient is currently admitted to the hospital and receiving intravenous antibiotics.; Antibiotics were administered within 1 hour of the procedure. Glucagon 1 mg IV CONTRAST:  20 mL of Omnipaque 300 administered into the gastric lumen. ANESTHESIA/SEDATION: The patient received conscious sedation with intravenous Versed and Fentanyl Sedation time 13 minutes FLUOROSCOPY TIME:  2 minutes 18 seconds (17.9 mGy) COMPLICATIONS: None immediate PROCEDURE: Informed written consent was obtained from the patient following explanation of the procedure, risks, benefits and alternatives. A time out was performed prior to the initiation of the procedure. Ultrasound scanning was performed to demarcate the edge of the left lobe of the liver. Maximal barrier sterile technique utilized including caps, mask, sterile gowns, sterile gloves, large sterile drape, hand hygiene and Betadine prep. The left upper quadrant was sterilely prepped and draped. An oral gastric catheter was inserted into the stomach under fluoroscopy. The existing nasogastric feeding tube was removed. The left costal margin and air opacified transverse colon were identified and avoided. Air was injected into the stomach  for insufflation and visualization under fluoroscopy. Under sterile conditions a 17 gauge trocar needle was utilized to access the stomach percutaneously beneath the left subcostal margin after the overlying soft tissues were anesthetized with 1% Lidocaine with epinephrine. Needle position was confirmed within the stomach with aspiration of air and injection of small amount of contrast. A single T tack was deployed for gastropexy. Over an Amplatz guide wire, a 9-French sheath was inserted into the stomach. A snare device was utilized to capture the oral gastric catheter. The snare device was pulled retrograde from the stomach up the esophagus and out the oropharynx. The 20-French pull-through gastrostomy was connected to the snare device and pulled antegrade through the oropharynx down the esophagus into the stomach and then through the percutaneous tract external to the patient. The gastrostomy was assembled externally. Contrast injection confirms position in the stomach. Several spot radiographic images were obtained in various obliquities for documentation. The patient tolerated procedure well without immediate post procedural complication. FINDINGS: After successful fluoroscopic guided placement, the gastrostomy tube is appropriately positioned with internal disc against the ventral aspect of the gastric lumen. IMPRESSION: Successful fluoroscopic insertion of a 20-French pull-through gastrostomy tube. The gastrostomy may be used immediately for medication administration and in 24 hrs for the initiation of feeds. Electronically Signed   By: Simonne Come M.D.   On: 12/04/2014 16:42     ASSESSMENT:  1  Rhythm  Pt with Afib and SVT  Probably exacerbated by current stress with anemia. On amiodarone and HR has improved.  (Bp was low before starting) Would continue  Not an anticoag candidate Would get echo to evaluate LVEF She had been on sotalol as an outpatient  I would stop  At her age should only be qd and  currently would prob recomm continuing with outpt amiodarone.    2.  Anemia  Receiving p RBC  Goal Hgb greater than 8  3. GI  Recent surgery as notede  4 Hx RA   Had been on prednisone preop  Should be on stress dose steroids now if   5  Hx remote CVA    Had not been on coumadin

## 2014-12-06 NOTE — Progress Notes (Signed)
8 Days Post-Op  Subjective: Feels weak.  No chest pain or dyspnea.  Objective: Vital signs in last 24 hours: Temp:  [97.6 F (36.4 C)-98.4 F (36.9 C)] 97.6 F (36.4 C) (10/15 0431) Pulse Rate:  [82-95] 82 (10/15 0431) Resp:  [18-20] 20 (10/15 0431) BP: (89-104)/(52-63) 89/55 mmHg (10/15 0431) SpO2:  [96 %-100 %] 100 % (10/15 0830) Weight:  [73.2 kg (161 lb 6 oz)] 73.2 kg (161 lb 6 oz) (10/15 0431) Last BM Date: 12/06/14  Intake/Output from previous day: 10/14 0701 - 10/15 0700 In: 390 [NG/GT:100] Out: 150 [Urine:150] Intake/Output this shift:    PE: General- In NAD Abdomen-soft, incisions are clean and intact, g-tube in left abdomen  Lab Results:   Recent Labs  12/04/14 0517 12/06/14 0630  WBC 6.6 9.5  HGB 9.5* 6.0*  HCT 29.3* 19.4*  PLT 236 226   BMET  Recent Labs  12/04/14 0517 12/05/14 0523 12/06/14 0630  NA 135  --  138  K 3.6  --  4.2  CL 101  --  105  CO2 28  --  26  GLUCOSE 116*  --  145*  BUN <5*  --  36*  CREATININE 0.58 0.69 0.78  CALCIUM 8.0*  --  8.7*   PT/INR  Recent Labs  12/04/14 1145  LABPROT 16.3*  INR 1.30   Comprehensive Metabolic Panel:    Component Value Date/Time   NA 138 12/06/2014 0630   NA 135 12/04/2014 0517   K 4.2 12/06/2014 0630   K 3.6 12/04/2014 0517   CL 105 12/06/2014 0630   CL 101 12/04/2014 0517   CO2 26 12/06/2014 0630   CO2 28 12/04/2014 0517   BUN 36* 12/06/2014 0630   BUN <5* 12/04/2014 0517   CREATININE 0.78 12/06/2014 0630   CREATININE 0.69 12/05/2014 0523   GLUCOSE 145* 12/06/2014 0630   GLUCOSE 116* 12/04/2014 0517   CALCIUM 8.7* 12/06/2014 0630   CALCIUM 8.0* 12/04/2014 0517   AST 33 12/03/2014 0806   AST 41* 04/26/2014 0800   ALT 17 12/03/2014 0806   ALT 27 04/26/2014 0800   ALKPHOS 48 12/03/2014 0806   ALKPHOS 117 04/26/2014 0800   BILITOT 0.9 12/03/2014 0806   BILITOT 1.2 04/26/2014 0800   PROT 6.2* 12/03/2014 0806   PROT 7.9 04/26/2014 0800   ALBUMIN 2.7* 12/03/2014 0806    ALBUMIN 3.4* 04/26/2014 0800     Studies/Results: Ir Gastrostomy Tube Mod Sed  12/04/2014  INDICATION: Dysphagia/aspiration. In need of gastrostomy tube for enteric nutrition supplementation. EXAM: PULL TROUGH GASTROSTOMY TUBE PLACEMENT COMPARISON:  CT abdomen pelvis - 09/15/2014 MEDICATIONS: Patient is currently admitted to the hospital and receiving intravenous antibiotics.; Antibiotics were administered within 1 hour of the procedure. Glucagon 1 mg IV CONTRAST:  20 mL of Omnipaque 300 administered into the gastric lumen. ANESTHESIA/SEDATION: The patient received conscious sedation with intravenous Versed and Fentanyl Sedation time 13 minutes FLUOROSCOPY TIME:  2 minutes 18 seconds (17.9 mGy) COMPLICATIONS: None immediate PROCEDURE: Informed written consent was obtained from the patient following explanation of the procedure, risks, benefits and alternatives. A time out was performed prior to the initiation of the procedure. Ultrasound scanning was performed to demarcate the edge of the left lobe of the liver. Maximal barrier sterile technique utilized including caps, mask, sterile gowns, sterile gloves, large sterile drape, hand hygiene and Betadine prep. The left upper quadrant was sterilely prepped and draped. An oral gastric catheter was inserted into the stomach under fluoroscopy. The existing nasogastric feeding  tube was removed. The left costal margin and air opacified transverse colon were identified and avoided. Air was injected into the stomach for insufflation and visualization under fluoroscopy. Under sterile conditions a 17 gauge trocar needle was utilized to access the stomach percutaneously beneath the left subcostal margin after the overlying soft tissues were anesthetized with 1% Lidocaine with epinephrine. Needle position was confirmed within the stomach with aspiration of air and injection of small amount of contrast. A single T tack was deployed for gastropexy. Over an Amplatz guide  wire, a 9-French sheath was inserted into the stomach. A snare device was utilized to capture the oral gastric catheter. The snare device was pulled retrograde from the stomach up the esophagus and out the oropharynx. The 20-French pull-through gastrostomy was connected to the snare device and pulled antegrade through the oropharynx down the esophagus into the stomach and then through the percutaneous tract external to the patient. The gastrostomy was assembled externally. Contrast injection confirms position in the stomach. Several spot radiographic images were obtained in various obliquities for documentation. The patient tolerated procedure well without immediate post procedural complication. FINDINGS: After successful fluoroscopic guided placement, the gastrostomy tube is appropriately positioned with internal disc against the ventral aspect of the gastric lumen. IMPRESSION: Successful fluoroscopic insertion of a 20-French pull-through gastrostomy tube. The gastrostomy may be used immediately for medication administration and in 24 hrs for the initiation of feeds. Electronically Signed   By: Simonne ComeJohn  Watts M.D.   On: 12/04/2014 16:42    Anti-infectives: Anti-infectives    Start     Dose/Rate Route Frequency Ordered Stop   12/05/14 1000  azithromycin (ZITHROMAX) tablet 250 mg     250 mg Oral Daily 12/05/14 0746     12/01/14 1500  cefTAZidime (FORTAZ) 1 g in dextrose 5 % 50 mL IVPB  Status:  Discontinued    Comments:  When taking PO well, switch to home azithromycin 250mg  PO daily   1 g 100 mL/hr over 30 Minutes Intravenous Every 12 hours 12/01/14 1332 12/05/14 0745   11/28/14 2300  cefoTEtan (CEFOTAN) 2 g in dextrose 5 % 50 mL IVPB     2 g 100 mL/hr over 30 Minutes Intravenous Every 12 hours 11/28/14 1736 11/28/14 2330   11/28/14 1532  clindamycin (CLEOCIN) 900 mg, gentamicin (GARAMYCIN) 240 mg in sodium chloride 0.9 % 1,000 mL for intraperitoneal lavage  Status:  Discontinued       As needed  11/28/14 1532 11/28/14 1535   11/28/14 1006  cefoTEtan (CEFOTAN) 2 g in dextrose 5 % 50 mL IVPB     2 g 100 mL/hr over 30 Minutes Intravenous On call to O.R. 11/28/14 1006 11/28/14 1305   11/28/14 0600  clindamycin (CLEOCIN) 900 mg, gentamicin (GARAMYCIN) 240 mg in sodium chloride 0.9 % 1,000 mL for intraperitoneal lavage  Status:  Discontinued    Comments:  Pharmacy may adjust dosing strength, schedule, rate of infusion, etc as needed to optimize therapy    Intraperitoneal To Surgery 11/27/14 1323 11/28/14 1643      Assessment Principal Problem:   Colovaginal fistula s/p robotic colectomy/repair 11/28/2014 Active Problems:  ABL anemia-hemoglobin 6 this AM.   COPD (chronic obstructive pulmonary disease) (HCC)   GERD (gastroesophageal reflux disease)     Chronic antibiotic suppression - Azithromycin for chronic bronchiectasis with Pseudomonas   Protein-calorie malnutrition, moderate (HCC)   Dysphagia    LOS: 8 days   Plan: Transfuse 2 units PRBCs. Recheck hemoglobin tomorrow.  Julie Richardson J 12/06/2014

## 2014-12-06 NOTE — Progress Notes (Signed)
CRITICAL VALUE ALERT  Critical value received:  Hgb 6.0  Date of notification:  12/06/14  Time of notification:  0730  Critical value read back: yes  Nurse who received alert:  Jerrel IvorySandra Gagliano, RN  MD notified (1st page):  Dr. Gerrit FriendsGerkin  Time of first page:  0740  MD notified (2nd page):  Time of second page:  Responding MD:  Dr. Gerrit FriendsGerkin  Time MD responded:  [phone]

## 2014-12-06 NOTE — Progress Notes (Signed)
Dr. Izola PriceMyers notified that pt HR 110s-140s, going in & out of Atrial Fib and Atrial flutter. Will begin blood transfusion as ordered when blood bank has units prepared and continue to monitor HR & B/P. Dr. Izola PriceMyers aware.  Delford FieldGagliano, Sandra E, RN

## 2014-12-06 NOTE — Progress Notes (Addendum)
Patient ID: Julie Richardson, female   DOB: 01/02/1926, 81 y.o.   MRN: [id number]  [facility] PROGRESS NOTE  Analysse Quinonez HYQ:[id number] DOB: Feb 27, 1925 DOA: 11/28/2014 PCP: Darlina Guys, MD   Brief narrative:    81 yo female with chronic dysphagia, stroke 7 yrs ago, RA on chronic prednisone, remote paroxysmal a-fib but no longer on AC, colo-vaginal fistula, who was admitted 10/07 under surgery team and underwent sigmoid colectomy on 10/7. [facility] was consulted for evaluation of slurred speech.  Assessment/Plan:    Principal Problem:   Colovaginal fistula s/p robotic colectomy/repair 11/28/2014 - post op day #8, clinically stable - management per surgery team  - path report unremarkable   Active Problems:   Chronic dysphagia with aspiration - ENT seen in consultation, pt at high risk for aspiration as there is a significant sensorimotor defect in initiating swallow - recommended gastrostomy tube placement  - pt is now s/p fluoroscopic guided  Insertion of gastrostomy tube, 10/13, TF initiated, so far tolerating well     Sinus tachycardia  - intermittent a-fib episodes  - likely exacerbated by acute blood loss anemia, Hg drop to 6 - address with transfusion and monitor - check 12 lead EKG, Cardiology consulted, stop sotalol  - place on amio IV bolus first and infusion, transfer to SDU  - not candidate for [facility]    Hypotension - likely exacerbated by blood loss, stop sotalol - pt was on solumedrol while inpatient and was transitioned to Prednisone 5 mg PO 10/14 - will place back on stress dose steroids and plan on slower tapering  - we may also consider NS bolus if SBP persistently < 80    Acute on chronic blood loss anemia - Hg drop in the past 24 hours - transfuse 2 U PRBC 10/15 - repeat Hg in AM    COPD (chronic obstructive pulmonary disease) (HCC) - respiratory status is stable this AM    GERD (gastroesophageal reflux disease) - continue with Protonix     Bronchiectasis  with Pseudomonas without acute exacerbation (HCC) - continue home medical regimen with Zithromax     Hypokalemia - supplemented and WNL this AM - will repeat BMP again in AM     Hypomagnesemia - supplemented and WNL 10/13    Protein-calorie malnutrition, moderate (HCC), underweight  - gastrostomy tube placed and TF started  - Body mass index is 17.53     RA on chronic Prednisone - has been on solumedrol while inpatient and was transitioned to Prednisone home regimen 5 mg po QD - will stop and place on stress dose steroids to avoid potential adrenal crisis   DVT prophylaxis - SCD's, stop Lovenox SQ today, if Hg stable in next 24 hours, may resume Lovenox SQ if still inpatient   Code Status: DNR Family Communication:  plan of care discussed with the patient Disposition Plan: To be determined, per surgery team   IV access:  Peripheral IV  Procedures and diagnostic studies:    CXR 12/02/2014  Cardiomegaly with mild pulmonary vascular prominence, c/w mild congestive heart failure.  CXR 11/30/2014 Persistent areas of bronchiectasis and peribronchial thickening, with lower lobe predominance.  Ct Head Wo Contrast  11/30/2014 Atrophy and chronic small vessel disease. No acute intracranial finding.  MRI Brain 11/30/2014  No acute abnormality,chronic small vessel ischemia, chronic R basal ganglia lacunar infarct.   Dg Esophagus 12/02/2014 Poor esophageal motility with difficulty propelling the bolus into the esophagus. There is pooling in the pharynx. There was aspiration of thick  barium. There was penetration with thin barium. Limited evaluation the esophagus due to small boluses. There is esophageal dysmotility. No stricture or obstruction of the esophagus.   Medical Consultants:  GI ENT  Other Consultants:  PT - home health PT  IAnti-Infectives:   None  Debbora PrestoMAGICK-MYERS, ISKRA, MD  [facility] Pager [phone]  If 7PM-7AM, please contact night-coverage [email or web address] Password  [email or web address] 12/06/2014, 12:44 PM   LOS: 8 days   HPI/Subjective: No events overnight.   Objective: Filed Vitals:   12/06/14 0830 12/06/14 1044 12/06/14 1114 12/06/14 1130  BP:  86/55 80/43 77/48   Pulse:  111 129 126  Temp:  97.5 F (36.4 C) 97.8 F (36.6 C) 97.3 F (36.3 C)  TempSrc:  Oral  Oral  Resp:  16 28 28   Height:      Weight:      SpO2: 100% 100% 100% 100%    Intake/Output Summary (Last 24 hours) at 12/06/14 1244 Last data filed at 12/06/14 1130  Gross per 24 hour  Intake    380 ml  Output      0 ml  Net    380 ml    Exam:   General:  Pt is alert, follows commands appropriately, not in acute distress  Cardiovascular: Regular rhythm, tachycardic, no rubs, no gallops  Respiratory: Clear to auscultation bilaterally, no wheezing, diminished breath sounds at bases   Abdomen: Soft, non tender, non distended, no guarding  Data Reviewed: Basic Metabolic Panel:  Recent Labs Lab 12/01/14 0545 12/02/14 0427 12/03/14 0501 12/04/14 0517 12/05/14 0523 12/06/14 0630  NA 140 136 139 135  --  138  K 3.5 3.0* 3.0* 3.6  --  4.2  CL 109 104 105 101  --  105  CO2 18* 25 26 28   --  26  GLUCOSE 52* 157* 137* 116*  --  145*  BUN 20 11 8  <5*  --  36*  CREATININE 0.78 0.73 0.60 0.58 0.69 0.78  CALCIUM 8.4* 8.2* 8.1* 8.0*  --  8.7*  MG  --  1.5*  --  2.4  --  2.0   Liver Function Tests:  Recent Labs Lab 12/03/14 0806  AST 33  ALT 17  ALKPHOS 48  BILITOT 0.9  PROT 6.2*  ALBUMIN 2.7*    Recent Labs Lab 12/03/14 0806  LIPASE 33   CBC:  Recent Labs Lab 12/01/14 0545 12/02/14 0427 12/03/14 0501 12/04/14 0517 12/06/14 0630  WBC 10.4 7.4 7.0 6.6 9.5  HGB 8.9* 9.6* 8.8* 9.5* 6.0*  HCT 28.5* 30.0* 27.3* 29.3* 19.4*  MCV 96.3 92.3 91.6 91.6 94.6  PLT 236 235 225 236 226   CBG:  Recent Labs Lab 12/05/14 1945 12/06/14 0008 12/06/14 0434 12/06/14 0739 12/06/14 1151  GLUCAP 121* 124* 135* 127* 155*   . atorvastatin  20 mg Oral QHS  . azithromycin   250 mg Oral Daily  . cholecalciferol  2,000 Units Oral Daily  . cycloSPORINE  1 drop Both Eyes BID  . diltiazem  10 mg Intravenous Once  . enoxaparin  injection  30 mg Subcutaneous Q24H  . erythromycin  1 application Both Eyes QHS  . ipratropium-albuterol  3 mL Nebulization BID  . lip balm  1 application Topical BID  . metoCLOPramide  10 mg Per Tube 4 times per day  . pantoprazole sodium  80 mg Per Tube Daily  . predniSONE  5 mg Oral Q breakfast  . saccharomyces boulardii  250 mg Oral BID  . sotalol  80  mg Oral BID  . vitamin C  500 mg Oral BID

## 2014-12-06 NOTE — Progress Notes (Signed)
Patient started to have a bowel movements yesterday afternoon, and patient had 4 black watery stool since last night N.P. Informed. And ordered to check AM labs. Will continue to monitor the patient.

## 2014-12-06 NOTE — Progress Notes (Addendum)
Pt tachy with SVT and HR up to 170, SBP in 70's. Move to SDU, cardiology consulted, may need PCCM consult as well if persistently hypotensive. Will address hypotension with NS boluses.   Debbora PrestoMAGICK-MYERS, ISKRA, MD  [facility] Pager [phone]  If 7PM-7AM, please contact night-coverage [email or web address] Password [email or web address]

## 2014-12-07 DIAGNOSIS — R1312 Dysphagia, oropharyngeal phase: Secondary | ICD-10-CM | POA: Insufficient documentation

## 2014-12-07 DIAGNOSIS — R1310 Dysphagia, unspecified: Secondary | ICD-10-CM

## 2014-12-07 DIAGNOSIS — I471 Supraventricular tachycardia: Secondary | ICD-10-CM

## 2014-12-07 DIAGNOSIS — Z8673 Personal history of transient ischemic attack (TIA), and cerebral infarction without residual deficits: Secondary | ICD-10-CM

## 2014-12-07 DIAGNOSIS — J479 Bronchiectasis, uncomplicated: Secondary | ICD-10-CM

## 2014-12-07 DIAGNOSIS — I4891 Unspecified atrial fibrillation: Secondary | ICD-10-CM

## 2014-12-07 LAB — CBC
HCT: 31.1 % — ABNORMAL LOW (ref 36.0–46.0)
HEMATOCRIT: 31.5 % — AB (ref 36.0–46.0)
Hemoglobin: 10.1 g/dL — ABNORMAL LOW (ref 12.0–15.0)
Hemoglobin: 10.1 g/dL — ABNORMAL LOW (ref 12.0–15.0)
MCH: 29 pg (ref 26.0–34.0)
MCH: 29.2 pg (ref 26.0–34.0)
MCHC: 32.1 g/dL (ref 30.0–36.0)
MCHC: 32.5 g/dL (ref 30.0–36.0)
MCV: 89.9 fL (ref 78.0–100.0)
MCV: 90.5 fL (ref 78.0–100.0)
PLATELETS: 186 10*3/uL (ref 150–400)
Platelets: 185 10*3/uL (ref 150–400)
RBC: 3.48 MIL/uL — ABNORMAL LOW (ref 3.87–5.11)
RBC: 3.46 MIL/uL — AB (ref 3.87–5.11)
RDW: 16.4 % — ABNORMAL HIGH (ref 11.5–15.5)
RDW: 16.6 % — AB (ref 11.5–15.5)
WBC: 5.6 10*3/uL (ref 4.0–10.5)
WBC: 6.7 10*3/uL (ref 4.0–10.5)

## 2014-12-07 LAB — GLUCOSE, CAPILLARY
GLUCOSE-CAPILLARY: 163 mg/dL — AB (ref 65–99)
GLUCOSE-CAPILLARY: 183 mg/dL — AB (ref 65–99)
GLUCOSE-CAPILLARY: 191 mg/dL — AB (ref 65–99)
GLUCOSE-CAPILLARY: 201 mg/dL — AB (ref 65–99)
Glucose-Capillary: 141 mg/dL — ABNORMAL HIGH (ref 65–99)
Glucose-Capillary: 204 mg/dL — ABNORMAL HIGH (ref 65–99)
Glucose-Capillary: 186 mg/dL — ABNORMAL HIGH (ref 65–99)
Glucose-Capillary: 146 mg/dL — ABNORMAL HIGH (ref 65–99)

## 2014-12-07 LAB — TYPE AND SCREEN
ABO/RH(D): O POS
ANTIBODY SCREEN: NEGATIVE
UNIT DIVISION: 0
UNIT DIVISION: 0

## 2014-12-07 LAB — MAGNESIUM: MAGNESIUM: 1.9 mg/dL (ref 1.7–2.4)

## 2014-12-07 LAB — BASIC METABOLIC PANEL
Anion gap: 8 (ref 5–15)
BUN: 32 mg/dL — ABNORMAL HIGH (ref 6–20)
CHLORIDE: 105 mmol/L (ref 101–111)
CO2: 25 mmol/L (ref 22–32)
Calcium: 8.3 mg/dL — ABNORMAL LOW (ref 8.9–10.3)
Creatinine, Ser: 0.69 mg/dL (ref 0.44–1.00)
GFR calc non Af Amer: >60 mL/min (ref >60)
Glucose, Bld: 222 mg/dL — ABNORMAL HIGH (ref 65–99)
POTASSIUM: 4.2 mmol/L (ref 3.5–5.1)
SODIUM: 138 mmol/L (ref 135–145)

## 2014-12-07 MED ORDER — PNEUMOCOCCAL VAC POLYVALENT 25 MCG/0.5ML IJ INJ
0.5000 mL | INJECTION | INTRAMUSCULAR | Status: DC
Start: 2014-12-08 — End: 2014-12-10
  Filled 2014-12-07 (×2): qty 0.5

## 2014-12-07 MED ORDER — INSULIN ASPART 100 UNIT/ML ~~LOC~~ SOLN
0.0000 [IU] | SUBCUTANEOUS | Status: DC
  Administered 2014-12-07 (×2): 2 [IU] via SUBCUTANEOUS
  Administered 2014-12-07: 1 [IU] via SUBCUTANEOUS
  Administered 2014-12-07: 2 [IU] via SUBCUTANEOUS
  Administered 2014-12-07 (×2): 3 [IU] via SUBCUTANEOUS
  Administered 2014-12-08: 2 [IU] via SUBCUTANEOUS
  Administered 2014-12-08 – 2014-12-09 (×4): 1 [IU] via SUBCUTANEOUS
  Administered 2014-12-10: 2 [IU] via SUBCUTANEOUS

## 2014-12-07 MED ORDER — HYDROCORTISONE NA SUCCINATE PF 100 MG IJ SOLR
50.0000 mg | Freq: Every day | INTRAMUSCULAR | Status: DC
  Administered 2014-12-08: 50 mg via INTRAVENOUS
  Filled 2014-12-07: qty 1

## 2014-12-07 NOTE — Progress Notes (Signed)
Patient ID: Julie Richardson, female   DOB: 03/04/1925, 81 y.o.   MRN: [id number]  General Surgery [facility]  POD#: 9  Subjective: Patient in stepdown.  Alert and responsive.  No complaints.  Back in sinus rhythm.  Medical service and cardiology following.  Objective: Vital signs in last 24 hours: Temp:  [97.3 F (36.3 C)-98.2 F (36.8 C)] 97.4 F (36.3 C) (10/16 0800) Pulse Rate:  [61-171] 64 (10/16 0700) Resp:  [12-28] 13 (10/16 0700) BP: (72-141)/(42-87) 131/63 mmHg (10/16 0700) SpO2:  [96 %-100 %] 96 % (10/16 0700) Weight:  [41.9 kg (92 lb 6 oz)-44.2 kg (97 lb 7.1 oz)] 41.9 kg (92 lb 6 oz) (10/16 0435) Last BM Date: 12/06/14  Intake/Output from previous day: 10/15 0701 - 10/16 0700 In: 2039.9 [I.V.:272.9; Blood:647; NG/GT:950] Out: -  Intake/Output this shift:    Physical Exam: HEENT - sclerae clear, mucous membranes moist Neck - soft Chest - clear bilaterally Cor - RRR Abdomen - soft without distension; G-tube LUQ with TF's infusing; wounds dry and intact Ext - no edema, non-tender Neuro - alert & oriented, no focal deficits  Lab Results:   Recent Labs  12/07/14 0022 12/07/14 0729  WBC 5.6 6.7  HGB 10.1* 10.1*  HCT 31.1* 31.5*  PLT 185 186   BMET  Recent Labs  12/06/14 0630 12/07/14 0542  NA 138 138  K 4.2 4.2  CL 105 105  CO2 26 25  GLUCOSE 145* 222*  BUN 36* 32*  CREATININE 0.78 0.69  CALCIUM 8.7* 8.3*   PT/INR  Recent Labs  12/04/14 1145  LABPROT 16.3*  INR 1.30   Comprehensive Metabolic Panel:    Component Value Date/Time   NA 138 12/07/2014 0542   NA 138 12/06/2014 0630   K 4.2 12/07/2014 0542   K 4.2 12/06/2014 0630   CL 105 12/07/2014 0542   CL 105 12/06/2014 0630   CO2 25 12/07/2014 0542   CO2 26 12/06/2014 0630   BUN 32* 12/07/2014 0542   BUN 36* 12/06/2014 0630   CREATININE 0.69 12/07/2014 0542   CREATININE 0.78 12/06/2014 0630   GLUCOSE 222* 12/07/2014 0542   GLUCOSE 145* 12/06/2014 0630   CALCIUM 8.3* 12/07/2014 0542   CALCIUM 8.7* 12/06/2014 0630   AST 33 12/03/2014 0806   AST 41* 04/26/2014 0800   ALT 17 12/03/2014 0806   ALT 27 04/26/2014 0800   ALKPHOS 48 12/03/2014 0806   ALKPHOS 117 04/26/2014 0800   BILITOT 0.9 12/03/2014 0806   BILITOT 1.2 04/26/2014 0800   PROT 6.2* 12/03/2014 0806   PROT 7.9 04/26/2014 0800   ALBUMIN 2.7* 12/03/2014 0806   ALBUMIN 3.4* 04/26/2014 0800    Studies/Results: No results found.  Anti-infectives: Anti-infectives    Start     Dose/Rate Route Frequency Ordered Stop   12/05/14 1000  azithromycin (ZITHROMAX) tablet 250 mg     250 mg Oral Daily 12/05/14 0746     12/01/14 1500  cefTAZidime (FORTAZ) 1 g in dextrose 5 % 50 mL IVPB  Status:  Discontinued    Comments:  When taking PO well, switch to home azithromycin 250mg  PO daily   1 g 100 mL/hr over 30 Minutes Intravenous Every 12 hours 12/01/14 1332 12/05/14 0745   11/28/14 2300  cefoTEtan (CEFOTAN) 2 g in dextrose 5 % 50 mL IVPB     2 g 100 mL/hr over 30 Minutes Intravenous Every 12 hours 11/28/14 1736 11/28/14 2330   11/28/14 1532  clindamycin (CLEOCIN)  900 mg, gentamicin (GARAMYCIN) 240 mg in sodium chloride 0.9 % 1,000 mL for intraperitoneal lavage  Status:  Discontinued       As needed 11/28/14 1532 11/28/14 1535   11/28/14 1006  cefoTEtan (CEFOTAN) 2 g in dextrose 5 % 50 mL IVPB     2 g 100 mL/hr over 30 Minutes Intravenous On call to O.R. 11/28/14 1006 11/28/14 1305   11/28/14 0600  clindamycin (CLEOCIN) 900 mg, gentamicin (GARAMYCIN) 240 mg in sodium chloride 0.9 % 1,000 mL for intraperitoneal lavage  Status:  Discontinued    Comments:  Pharmacy may adjust dosing strength, schedule, rate of infusion, etc as needed to optimize therapy    Intraperitoneal To Surgery 11/27/14 1323 11/28/14 1643      Assessment & Plans:  Colovaginal fistula s/p robotic colectomy/repair 11/28/2014  On TF's at goal rate  Afib with rapid rate - now back in sinus rhythm  Amiodarone IV to  continue  Cardiology and medicine following  Anemia  Hgb >10 after transfusions  OK from surgical standpoint to transfer to telemetry today.  Await medical service evaluation.   Velora Heckler, MD, [facility] Office: [phone]   [name] Judie Petit 12/07/2014

## 2014-12-07 NOTE — Progress Notes (Addendum)
Patient ID: Julie Richardson, female   DOB: 1925/11/16, 81 y.o.   MRN: [id number]  [facility] PROGRESS NOTE  Julie Richardson WUJ:[id number] DOB: 1925-03-04 DOA: 11/28/2014 PCP: Darlina Guys, MD   Brief narrative:    81 yo female with chronic dysphagia, stroke 7 yrs ago, RA on chronic prednisone, remote paroxysmal a-fib but no longer on AC, colo-vaginal fistula, who was admitted 10/07 under surgery team and underwent sigmoid colectomy on 10/7. [facility] was consulted for evaluation of slurred speech.  Major events since admission: 10/15 - hypotensive, requiring transfer to SDU 10/16 - stable, in sinus rhythm   Assessment/Plan:    Principal Problem:   Colovaginal fistula s/p robotic colectomy/repair 11/28/2014 - post op day #10, clinically stable - management per surgery team  - path report unremarkable   Active Problems:   Chronic dysphagia with aspiration - ENT seen in consultation, pt at high risk for aspiration as there is a significant sensorimotor defect in initiating swallow - recommended gastrostomy tube placement  - pt is now s/p fluoroscopic guided  Insertion of gastrostomy tube, 10/13, TF initiated, so far tolerating well     Sinus tachycardia  - resolved - per cardiology, stopped sotalol - currently on Cardizem and amiodarone IV and per cardiology, plan to transition to PO if surgery OK     Hypotension - likely exacerbated by blood loss and use of sotalol  - pt was on solumedrol while inpatient and was transitioned to Prednisone 5 mg PO 10/14 - currently on stress dose steroids, will transition to Prednisone with continued tapering  - Prednisone 40 mg tablet to take on 10/18, 30 mg tablet on 10/19, 20 mg tablet on 10/20, 10 mg tablet on 10/21 - starting 10/22 take Prednisone 5 mg tablet and continue as per previous home medical regimen  - orders placed     Acute on chronic blood loss anemia - Hg drop 10/14 - transfused 2 U PRBC 10/15, Hg now stable ~10 - repeat Hg in  AM    COPD (chronic obstructive pulmonary disease) (HCC) - respiratory status is stable this AM    GERD (gastroesophageal reflux disease) - continue with Protonix     Bronchiectasis with Pseudomonas without acute exacerbation (HCC) - continue home medical regimen with Zithromax     Hypokalemia - supplement and repeat BMP in AM    Hypomagnesemia - supplemented and WNL 10/13    Protein-calorie malnutrition, moderate (HCC), underweight  - gastrostomy tube placed and TF started  - Body mass index is 17.53     RA on chronic Prednisone - has been on solumedrol while inpatient and was transitioned to Prednisone home regimen 5 mg po QD - taper down stress dose steroids as outlined above   DVT prophylaxis - SCD's, stopped Lovenox SQ   Code Status: DNR Family Communication:  plan of care discussed with the patient Disposition Plan: To be determined, per surgery team   IV access:  Peripheral IV  Procedures and diagnostic studies:    CXR 12/02/2014  Cardiomegaly with mild pulmonary vascular prominence, c/w mild congestive heart failure.  CXR 11/30/2014 Persistent areas of bronchiectasis and peribronchial thickening, with lower lobe predominance.  Ct Head Wo Contrast  11/30/2014 Atrophy and chronic small vessel disease. No acute intracranial finding.  MRI Brain 11/30/2014  No acute abnormality,chronic small vessel ischemia, chronic R basal ganglia lacunar infarct.   Dg Esophagus 12/02/2014 Poor esophageal motility with difficulty propelling the bolus into the esophagus. There is pooling in the pharynx. There was  aspiration of thick barium. There was penetration with thin barium. Limited evaluation the esophagus due to small boluses. There is esophageal dysmotility. No stricture or obstruction of the esophagus.   Medical Consultants:  GI ENT  Other Consultants:  PT - home health PT  IAnti-Infectives:   None  Debbora Presto, MD  [facility] Pager [phone]  If 7PM-7AM, please  contact night-coverage [email or web address] Password [email or web address] 12/07/2014, 12:50 PM   LOS: 9 days   HPI/Subjective: No events overnight.   Objective: Filed Vitals:   12/07/14 0700 12/07/14 0800 12/07/14 0900 12/07/14 0910  BP: 131/63 111/65 114/62   Pulse: 64 65 70   Temp:  97.4 F (36.3 C)    TempSrc:  Oral    Resp: Height:      Weight:      SpO2: 96% 97% 98% 98%    Intake/Output Summary (Last 24 hours) at 12/07/14 1250 Last data filed at 12/07/14 0900  Gross per 24 hour  Intake 2013.27 ml  Output      0 ml  Net 2013.27 ml    Exam:   General:  Pt is alert, follows commands appropriately, not in acute distress  Cardiovascular: Regular rhythm, no rubs, no gallops  Respiratory: Clear to auscultation bilaterally, no wheezing, diminished breath sounds at bases   Abdomen: Soft, non tender, non distended, no guarding  Data Reviewed: Basic Metabolic Panel:  Recent Labs Lab 12/02/14 0427 12/03/14 0501 12/04/14 0517 12/05/14 0523 12/06/14 0630 12/07/14 0542  NA 136 139 135  --  138 138  K 3.0* 3.0* 3.6  --  4.2 4.2  CL 104 105 101  --  105 105  CO2 --  26 25  GLUCOSE 157* 137* 116*  --  145* 222*  BUN 11 8 <5*  --  36* 32*  CREATININE 0.73 0.60 0.58 0.69 0.78 0.69  CALCIUM 8.2* 8.1* 8.0*  --  8.7* 8.3*  MG 1.5*  --  2.4  --  2.0 1.9   Liver Function Tests:  Recent Labs Lab 12/03/14 0806  AST 33  ALT 17  ALKPHOS 48  BILITOT 0.9  PROT 6.2*  ALBUMIN 2.7*    Recent Labs Lab 12/03/14 0806  LIPASE 33   CBC:  Recent Labs Lab 12/04/14 0517 12/06/14 0630 12/06/14 2140 12/07/14 0022 12/07/14 0729  WBC 6.6 9.5 6.1 5.6 6.7  HGB 9.5* 6.0* 10.2* 10.1* 10.1*  HCT 29.3* 19.4* 31.4* 31.1* 31.5*  MCV 91.6 94.6 90.0 89.9 90.5  PLT 236 226 173 185 186   CBG:  Recent Labs Lab 12/06/14 1700 12/06/14 2001 12/06/14 2336 12/07/14 0409 12/07/14 0738  GLUCAP 144* 141* 163* 204* 186*    . antiseptic oral rinse  15 mL Mouth Rinse TID  .  atorvastatin  20 mg Oral QHS  . azithromycin  250 mg Oral Daily  . bismuth subsalicylate  30 mL Oral BID  . Chlorhexidine Gluconate Cloth  6 each Topical Q0600  . cholecalciferol  2,000 Units Oral Daily  . cycloSPORINE  1 drop Both Eyes BID  . diltiazem  10 mg Intravenous Once  . erythromycin  1 application Both Eyes QHS  . feeding supplement (ENSURE ENLIVE)  237 mL Per Tube QID  . free water  100 mL Per Tube 3 times per day  . hydrocortisone sod succinate (SOLU-CORTEF) inj  50 mg Intravenous Daily  . insulin aspart  0-9 Units Subcutaneous 6 times per day  . ipratropium-albuterol  3 mL Nebulization BID  . lip balm  1 application Topical BID  . loperamide  2-4 mg Oral Once  . mupirocin ointment  1 application Nasal BID  . pantoprazole sodium  80 mg Per Tube Daily  . pneumococcal 23 valent vaccine  0.5 mL Intramuscular Tomorrow-1000  . RESOURCE THICKENUP CLEAR   Oral TID WC  . saccharomyces boulardii  250 mg Oral BID  . sodium chloride  3 mL Intravenous Q12H  . sodium chloride  3 mL Intravenous Q12H  . vitamin C  500 mg Oral BID

## 2014-12-07 NOTE — Progress Notes (Signed)
SUBJECTIVE: Doing well. Denies chest pain, shortness of breath, and palpitations.      Intake/Output Summary (Last 24 hours) at 12/07/14 0824 Last data filed at 12/07/14 0700  Gross per 24 hour  Intake 1969.87 ml  Output      0 ml  Net 1969.87 ml    Current Facility-Administered Medications  Medication Dose Route Frequency Provider Last Rate Last Dose  . 0.9 %  sodium chloride infusion  250 mL Intravenous PRN Karie Soda, MD      . 0.9 %  sodium chloride infusion  250 mL Intravenous PRN Karie Soda, MD 10 mL/hr at 12/06/14 2128 250 mL at 12/06/14 2128  . amiodarone (NEXTERONE PREMIX) 360 MG/200ML (1.8 mg/mL) IV infusion  30 mg/hr Intravenous Continuous Dorothea Ogle, MD 16.7 mL/hr at 12/07/14 0610 30 mg/hr at 12/07/14 0610  . antiseptic oral rinse (BIOTENE) solution 15 mL  15 mL Mouth Rinse TID Karie Soda, MD   15 mL at 12/06/14 2200  . atorvastatin (LIPITOR) tablet 20 mg  20 mg Oral QHS Karie Soda, MD   20 mg at 12/06/14 2144  . azithromycin (ZITHROMAX) tablet 250 mg  250 mg Oral Daily Karie Soda, MD   250 mg at 12/06/14 1025  . Chlorhexidine Gluconate Cloth 2 % PADS 6 each  6 each Topical Q0600 Karie Soda, MD   6 each at 12/07/14 0434  . cholecalciferol (VITAMIN D) tablet 2,000 Units  2,000 Units Oral Daily Karie Soda, MD   2,000 Units at 12/06/14 1025  . cycloSPORINE (RESTASIS) 0.05 % ophthalmic emulsion 1 drop  1 drop Both Eyes BID Karie Soda, MD   1 drop at 12/06/14 2145  . diltiazem (CARDIZEM) injection 10 mg  10 mg Intravenous Once Zannie Cove, MD      . diphenhydrAMINE (BENADRYL) injection 12.5 mg  12.5 mg Intravenous Q6H PRN Karie Soda, MD      . erythromycin ophthalmic ointment 1 application  1 application Both Eyes QHS Karie Soda, MD   1 application at 12/06/14 2152  . feeding supplement (JEVITY 1.2 CAL) liquid 1,000 mL  1,000 mL Per Tube Continuous Renie Ora, RD 40 mL/hr at 12/06/14 1550 1,000 mL at 12/06/14 1550  . fentaNYL (SUBLIMAZE)  injection 25-50 mcg  25-50 mcg Intravenous Q2H PRN Karie Soda, MD      . free water 100 mL  100 mL Per Tube 3 times per day Renie Ora, RD   100 mL at 12/07/14 0600  . glucagon (GLUCAGEN) injection   Intravenous PRN Simonne Come, MD   1 mg at 12/04/14 1600  . hydrocortisone (ANUSOL-HC) 2.5 % rectal cream 1 application  1 application Rectal PRN Karie Soda, MD      . hydrocortisone sodium succinate (SOLU-CORTEF) 100 MG injection 100 mg  100 mg Intravenous Q8H Dorothea Ogle, MD   100 mg at 12/07/14 0124  . insulin aspart (novoLOG) injection 0-9 Units  0-9 Units Subcutaneous 6 times per day Rolan Lipa, NP   3 Units at 12/07/14 0439  . iohexol (OMNIPAQUE) 300 MG/ML solution 20 mL  20 mL Other Once PRN Medication Radiologist, MD   20 mL at 12/04/14 1617  . ipratropium-albuterol (DUONEB) 0.5-2.5 (3) MG/3ML nebulizer solution 3 mL  3 mL Nebulization BID Karie Soda, MD   3 mL at 12/06/14 1950  . lidocaine (XYLOCAINE) 2 % jelly 1 application  1 application Topical Once PRN Serena Colonel, MD      .  lidocaine (XYLOCAINE) 4 % external solution 0-50 mL  0-50 mL Topical Once PRN Serena Colonel, MD      . lip balm (CARMEX) ointment 1 application  1 application Topical BID Karie Soda, MD   1 application at 12/06/14 2200  . menthol-cetylpyridinium (CEPACOL) lozenge 3 mg  1 lozenge Oral PRN Karie Soda, MD      . metoprolol (LOPRESSOR) injection 5 mg  5 mg Intravenous Q4H PRN Zannie Cove, MD   5 mg at 12/05/14 2208  . mupirocin ointment (BACTROBAN) 2 % 1 application  1 application Nasal BID Karie Soda, MD   1 application at 12/07/14 0127  . ondansetron (ZOFRAN) injection 4 mg  4 mg Intravenous Q6H PRN Karie Soda, MD      . oxymetazoline (AFRIN) 0.05 % nasal spray 1 spray  1 spray Each Nare Once PRN Serena Colonel, MD      . pantoprazole sodium (PROTONIX) 40 mg/20 mL oral suspension 80 mg  80 mg Per Tube Daily Karie Soda, MD   80 mg at 12/06/14 1055  . phenol (CHLORASEPTIC) mouth spray 2  spray  2 spray Mouth/Throat PRN Karie Soda, MD      . polyvinyl alcohol (LIQUIFILM TEARS) 1.4 % ophthalmic solution 1 drop  1 drop Both Eyes PRN Karie Soda, MD      . potassium chloride SA (K-DUR,KLOR-CON) CR tablet 20 mEq  20 mEq Oral Q4H PRN Karie Soda, MD      . promethazine (PHENERGAN) injection 6.25-12.5 mg  6.25-12.5 mg Intravenous Q4H PRN Karie Soda, MD      . RESOURCE THICKENUP CLEAR   Oral TID [facility] Karie Soda, MD   Stopped at 11/29/14 1200  . saccharomyces boulardii (FLORASTOR) capsule 250 mg  250 mg Oral BID Karie Soda, MD   250 mg at 12/06/14 2144  . sodium chloride 0.9 % injection 3 mL  3 mL Intravenous Q12H Karie Soda, MD   3 mL at 12/06/14 2155  . sodium chloride 0.9 % injection 3 mL  3 mL Intravenous PRN Karie Soda, MD      . sodium chloride 0.9 % injection 3 mL  3 mL Intravenous Q12H Karie Soda, MD   3 mL at 12/06/14 2155  . sodium chloride 0.9 % injection 3 mL  3 mL Intravenous PRN Karie Soda, MD      . traMADol Janean Sark) tablet 50-100 mg  50-100 mg Oral Q6H PRN Karie Soda, MD      . vitamin C (ASCORBIC ACID) tablet 500 mg  500 mg Oral BID Dorothea Ogle, MD   500 mg at 12/06/14 2144    Filed Vitals:   12/07/14 0615 12/07/14 0630 12/07/14 0645 12/07/14 0700  BP: 120/56 132/67 141/47 131/63  Pulse: 66 69 65 64  Temp:      TempSrc:      Resp: Height:      Weight:      SpO2: 99% 97% 97% 96%    PHYSICAL EXAM General: NAD, thin, frail HEENT: Normal. Neck: No JVD, no thyromegaly.  Lungs: Clear to auscultation bilaterally in anterior lung fields. CV: Nondisplaced PMI.  Regular rate and rhythm, normal S1/S2, no S3/S4, no murmur.  No pretibial edema.    Abdomen: Soft, nontender.  Neurologic: Alert and oriented.  Psych: Normal affect. Musculoskeletal: No gross deformities. Extremities: No clubbing or cyanosis.   TELEMETRY: Reviewed telemetry pt in sinus rhythm.  LABS: Basic Metabolic Panel:  Recent Labs  69/62/95 0630 12/07/14  0542    NA 138 138  K 4.2 4.2  CL 105 105  CO2 26 25  GLUCOSE 145* 222*  BUN 36* 32*  CREATININE 0.78 0.69  CALCIUM 8.7* 8.3*  MG 2.0 1.9   Liver Function Tests: No results for input(s): AST, ALT, ALKPHOS, BILITOT, PROT, ALBUMIN in the last 72 hours. No results for input(s): LIPASE, AMYLASE in the last 72 hours. CBC:  Recent Labs  12/07/14 0022 12/07/14 0729  WBC 5.6 6.7  HGB 10.1* 10.1*  HCT 31.1* 31.5*  MCV 89.9 90.5  PLT 185 186   Cardiac Enzymes: No results for input(s): CKTOTAL, CKMB, CKMBINDEX, TROPONINI in the last 72 hours. BNP: Invalid input(s): POCBNP D-Dimer: No results for input(s): DDIMER in the last 72 hours. Hemoglobin A1C: No results for input(s): HGBA1C in the last 72 hours. Fasting Lipid Panel: No results for input(s): CHOL, HDL, LDLCALC, TRIG, CHOLHDL, LDLDIRECT in the last 72 hours. Thyroid Function Tests: No results for input(s): TSH, T4TOTAL, T3FREE, THYROIDAB in the last 72 hours.  Invalid input(s): FREET3 Anemia Panel: No results for input(s): VITAMINB12, FOLATE, FERRITIN, TIBC, IRON, RETICCTPCT in the last 72 hours.  RADIOLOGY: Dg Chest 2 View  12/02/2014  CLINICAL DATA:  Dysphasia.  Shortness of breath. EXAM: CHEST  2 VIEW COMPARISON:  None. FINDINGS: Mediastinum and hilar structures are normal. Cardiomegaly with mild pulmonary vascular prominence and interstitial prominence with small pleural effusions. These findings suggest mild congestive heart failure. Bibasilar subsegmental atelectasis and/or scarring. No acute bony abnormality. IMPRESSION: Cardiomegaly with mild pulmonary vascular prominence and interstitial prominence with small bilateral pleural effusions consistent with mild congestive heart failure. Electronically Signed   By: Maisie Fus  Register   On: 12/02/2014 09:56   Dg Chest 2 View  11/30/2014  CLINICAL DATA:  Chronic bronchiectasis and dyspnea. EXAM: CHEST  2 VIEW COMPARISON:  04/26/2014 FINDINGS: Cardiomediastinal silhouette is  normal. Mediastinal contours appear intact. Aorta is torturous. There is no evidence of pneumothorax. There is bibasilar atelectasis. The previously seen areas of bronchiectasis and peribronchial thickening have improved but not completely resolved radiographically. There may be small bilateral pleural effusions. Osseous structures are without acute abnormality. There is compression deformity of 2 of the mid to lower thoracic vertebral bodies, stable from CT dated 03/29/2014. Soft tissues are grossly normal. IMPRESSION: Persistent areas of bronchiectasis and peribronchial thickening, with lower lobe predominance. Bibasilar atelectasis. Possible small bilateral pleural effusions. Electronically Signed   By: Ted Mcalpine M.D.   On: 11/30/2014 12:17   Ct Head Wo Contrast  11/30/2014  CLINICAL DATA:  Reason surgery with new onset of slurred speech postoperatively. EXAM: CT HEAD WITHOUT CONTRAST TECHNIQUE: Contiguous axial images were obtained from the base of the skull through the vertex without intravenous contrast. COMPARISON:  MRI same day.  Head CT 04/26/2014 FINDINGS: No sign of acute infarction. There is mild generalized brain atrophy. There are mild chronic small-vessel ischemic changes within the white matter. There are old lacunar infarctions in the right basal ganglia and thalamus. Physiologic calcification is present in the basal ganglia bilaterally. The patient has benign dural calcification. No mass lesion, hemorrhage, hydrocephalus or extra-axial collection. The calvarium is unremarkable. There is extensive opacification of the maxillary sinuses consistent with sinusitis. There is atherosclerotic calcification of the major vessels at the base of the brain. IMPRESSION: Atrophy and chronic small vessel disease. No acute intracranial finding. Maxillary sinusitis. Electronically Signed   By: Paulina Fusi M.D.   On: 11/30/2014 13:55   Mr Brain Wo Contrast  11/30/2014  CLINICAL DATA:  New onset  slurred speech.  Recent colectomy. EXAM: MRI HEAD WITHOUT CONTRAST TECHNIQUE: Multiplanar, multiecho pulse sequences of the brain and surrounding structures were obtained without intravenous contrast. COMPARISON:  Head CT 04/26/2014 FINDINGS: There is no evidence of acute infarct, intracranial hemorrhage, mass, midline shift, or extra-axial fluid collection. There is moderate generalized cerebral atrophy. T2 hyperintensities in the subcortical and deep cerebral white matter and pons are nonspecific but compatible with mild chronic small vessel ischemic disease. A chronic right basal ganglia lacunar infarct is again noted. Prior right cataract extraction is noted. Chronic bilateral maxillary sinusitis is again noted with likely proteinaceous/inspissated material filling both sinuses. There is also mild left ethmoid air cell mucosal thickening. A trace right mastoid effusion is noted. Major intracranial vascular flow voids are preserved. IMPRESSION: 1. No acute intracranial abnormality. 2. Mild chronic small vessel ischemic disease. Chronic right basal ganglia lacunar infarct. Electronically Signed   By: Sebastian Ache M.D.   On: 11/30/2014 13:31   Ir Gastrostomy Tube Mod Sed  12/04/2014  INDICATION: Dysphagia/aspiration. In need of gastrostomy tube for enteric nutrition supplementation. EXAM: PULL TROUGH GASTROSTOMY TUBE PLACEMENT COMPARISON:  CT abdomen pelvis - 09/15/2014 MEDICATIONS: Patient is currently admitted to the hospital and receiving intravenous antibiotics.; Antibiotics were administered within 1 hour of the procedure. Glucagon 1 mg IV CONTRAST:  20 mL of Omnipaque 300 administered into the gastric lumen. ANESTHESIA/SEDATION: The patient received conscious sedation with intravenous Versed and Fentanyl Sedation time 13 minutes FLUOROSCOPY TIME:  2 minutes 18 seconds (17.9 mGy) COMPLICATIONS: None immediate PROCEDURE: Informed written consent was obtained from the patient following explanation of the  procedure, risks, benefits and alternatives. A time out was performed prior to the initiation of the procedure. Ultrasound scanning was performed to demarcate the edge of the left lobe of the liver. Maximal barrier sterile technique utilized including caps, mask, sterile gowns, sterile gloves, large sterile drape, hand hygiene and Betadine prep. The left upper quadrant was sterilely prepped and draped. An oral gastric catheter was inserted into the stomach under fluoroscopy. The existing nasogastric feeding tube was removed. The left costal margin and air opacified transverse colon were identified and avoided. Air was injected into the stomach for insufflation and visualization under fluoroscopy. Under sterile conditions a 17 gauge trocar needle was utilized to access the stomach percutaneously beneath the left subcostal margin after the overlying soft tissues were anesthetized with 1% Lidocaine with epinephrine. Needle position was confirmed within the stomach with aspiration of air and injection of small amount of contrast. A single T tack was deployed for gastropexy. Over an Amplatz guide wire, a 9-French sheath was inserted into the stomach. A snare device was utilized to capture the oral gastric catheter. The snare device was pulled retrograde from the stomach up the esophagus and out the oropharynx. The 20-French pull-through gastrostomy was connected to the snare device and pulled antegrade through the oropharynx down the esophagus into the stomach and then through the percutaneous tract external to the patient. The gastrostomy was assembled externally. Contrast injection confirms position in the stomach. Several spot radiographic images were obtained in various obliquities for documentation. The patient tolerated procedure well without immediate post procedural complication. FINDINGS: After successful fluoroscopic guided placement, the gastrostomy tube is appropriately positioned with internal disc against  the ventral aspect of the gastric lumen. IMPRESSION: Successful fluoroscopic insertion of a 20-French pull-through gastrostomy tube. The gastrostomy may be used immediately for medication administration and in 24 hrs for the  initiation of feeds. Electronically Signed   By: Simonne Come M.D.   On: 12/04/2014 16:42   Dg Esophagus  12/02/2014  CLINICAL DATA:  Dysphagia. Aspiration on swallowing function study yesterday. History of aspiration pneumonia. EXAM: ESOPHOGRAM/BARIUM SWALLOW TECHNIQUE: Single contrast examination was performed using thin and thick barium. FLUOROSCOPY TIME:  Radiation Exposure Index (as provided by the fluoroscopic device): If the device does not provide the exposure index: Fluoroscopy Time:  2 minutes 5 seconds Number of Acquired Images: COMPARISON:  None. FINDINGS: Abnormal pharyngeal phase of swallowing. There was pooling of thick and thin barium in the valleculae and piriform sinuses. The patient had difficulty coordinating swallowing. Patient had difficulty moving the bolus into the esophagus. Initial swallows with thin barium revealed penetration without aspiration. Additional thick barium swallows progressed to frank aspiration of a mild amount. The esophageal motility is diminished. No stricture or mass. Limited evaluation of the esophagus due to limited barium in the esophagus. IMPRESSION: Poor esophageal motility with difficulty propelling the bolus into the esophagus. There is pooling in the pharynx. There was aspiration of thick barium. There was penetration with thin barium. Limited evaluation the esophagus due to small boluses. There is esophageal dysmotility. No stricture or obstruction of the esophagus. Electronically Signed   By: Marlan Palau M.D.   On: 12/02/2014 11:40   Dg Swallowing Func-speech Pathology  12/01/2014  Objective Swallowing Evaluation:   MBS Patient Details Name: Willena Jeancharles MRN: [id number] Date of Birth: 1926-02-15 Today's Date: 12/01/2014 Time: SLP  Start Time (ACUTE ONLY): 1348-SLP Stop Time (ACUTE ONLY): 1406 SLP Time Calculation (min) (ACUTE ONLY): 18 min Past Medical History: Past Medical History Diagnosis Date . HOH (hard of hearing)  . Anemia  . Diabetes mellitus without complication (HCC)  . History of home oxygen therapy    uses 2 liters at hs and prn during day . Atrial fibrillation (HCC)    hx of . Spinal stenosis  . GERD (gastroesophageal reflux disease)  . Chronic kidney disease    stage 3 . Arthritis  . Dysphagia    uses thickened liquids . Hyperlipidemia  . Pelvis fracture (HCC) 6 years ago . Complication of anesthesia    slow to wake up . Blind left eye  . HOH (hard of hearing)  . TIA (transient ischemic attack)    7 YEARS AGO, NONE SINCE . Pneumonia oct 2015   ASPIRATION PNEUMONIA X 3- 4 TIMES IN PAST . COPD (chronic obstructive pulmonary disease) (HCC)    pseodonomonus bronchiectasis Past Surgical History: Past Surgical History Procedure Laterality Date . Abdominal hysterectomy   . Right shoulder surgery  years ago   for fracture . Tonsillectomy  as child HPI: Other Pertinent Information: Pt is s/p XI robot assisted SIGMOID COLECTOMY, REPAIR OF COLOVAGINAL FISTULA,RIGID PROCTOSCOPY.   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 (overnight in 08/2013).  Pt reports using nectar thick liquids over the last year or so.  Swallow eval ordered as pt reports difficulty swallowing medicine - even when crushed.   No Data Recorded Assessment / Plan / Recommendation CHL IP CLINICAL IMPRESSIONS 12/01/2014 Therapy Diagnosis Suspected primary esophageal dysphagia;Mild pharyngeal phase dysphagia Clinical Impression Patient presents with a suspected primary esophageal dysphagia with mild pharyngeal component. Oral phase WFL. Timing of swallow adequate. Anterior hyo-laryngeal movement appearing mildly reduced by not significant enough to account for severely impaired opening of the UES.  Boluses of all consistencies trialed sit in  pyriform sinuses post swallow with little-no clearance of bolus into the esophagus. SLP instructed patient for a variety of head postures (head turns, tilts, and chin tucks) without change in function. Patient with eventual penetration and aspiration of consistencies provided with intact sensation and clearance of airway but inability to clear via esophagus requiring expectoration of bolus. Swallow is at this time non-functional given little-no clearance of bolus into esophagus. Per patient and MBS note from 2015, dysphagia only mild and functional. Patient was consuming po prior to admission. Of note, discussion with Dr. Jomarie LongsJoseph revealed that patient has [facility] aspiration PNA. At this time, while brief intubation and deconditioning may account for some decline in dysfunction, question if primary esophageal deficit is to blame for current level of dysfunction. Unclear baseline further complicates diagnostics. ENT or GI consult may be beneficial in ruling out primary UES dysfunction and determining potential for intervention to improve. SLP will continue to f/u at bedside to monitor for a facilitate improved swallowing function, assisting to determine potential to resume a po diet.    CHL IP TREATMENT RECOMMENDATION 12/01/2014 Treatment Recommendations Therapy as outlined in treatment plan below   CHL IP DIET RECOMMENDATION 12/01/2014 SLP Diet Recommendations Ice chips PRN after oral care;NPO Liquid Administration via (None) Medication Administration Via alternative means Compensations (None) Postural Changes and/or Swallow Maneuvers (None)   CHL IP OTHER RECOMMENDATIONS 12/01/2014 Recommended Consults Consider ENT evaluation;Consider GI evaluation Oral Care Recommendations Oral care QID Other Recommendations (None)     CHL IP FREQUENCY AND DURATION 12/01/2014 Speech Therapy Frequency (ACUTE ONLY) min 1 x/week  Treatment Duration 1 week       CHL IP REASON FOR REFERRAL 12/01/2014 Reason for Referral Objectively evaluate swallowing function       CHL IP GO 09/18/2013 Functional Assessment Tool Used skilled clinical judgment Functional Limitations Swallowing Swallow Current Status (M8413(G8996) CJ Swallow Goal Status (K4401(G8997) CJ Swallow Discharge Status (U2725(G8998) CJ Motor Speech Current Status (D6644(G8999) (None) Motor Speech Goal Status (I3474(G9186) (None) Motor Speech Goal Status (Q5956(G9158) (None) Spoken Language Comprehension Current Status (L8756(G9159) (None) Spoken Language Comprehension Goal Status (E3329(G9160) (None) Spoken Language Comprehension Discharge Status (J1884(G9161) (None) Spoken Language Expression Current Status (Z6606(G9162) (None) Spoken Language Expression Goal Status (T0160(G9163) (None) Spoken Language Expression Discharge Status (F0932(G9164) (None) Attention Current Status (T5573(G9165) (None) Attention Goal Status (U2025(G9166) (None) Attention Discharge Status (K2706(G9167) (None) Memory Current Status (C3762(G9168) (None) Memory Goal Status (G3151(G9169) (None) Memory Discharge Status (V6160(G9170) (None) Voice Current Status (V3710(G9171) (None) Voice Goal Status (G2694(G9172) (None) Voice Discharge Status (W5462(G9173) (None) Other Speech-Language Pathology Functional Limitation (V0350(G9174) (None) Other Speech-Language Pathology Functional Limitation Goal Status (K9381(G9175) (None) Other Speech-Language Pathology Functional Limitation Discharge Status [phone](G9176) (None) Ferdinand LangoLeah McCoy MA, CCC-SLP [phone]   McCoy Leah Meryl 12/01/2014, 2:37 PM      ASSESSMENT AND PLAN: 1. Atrial fibrillation and SVT: Currently in sinus rhythm. Now normotensive. Hgb up to 10.1 (had been 6 yesterday). Would continue IV amiodarone for now and probably transition to tablets via gastrostomy tube on 10/17. Not an anticoagulation candidate.  Agree with Dr. Tenny Crawoss in stopping sotalol altogether. Discussed with Dr. Gerrit FriendsGerkin.   2. Anemia: Hgb improved to 10.1, 6 yesterday.  3. History of CVA   Prentice DockerSuresh Pallavi Clifton, M.D.,  F.A.C.C.

## 2014-12-08 DIAGNOSIS — I480 Paroxysmal atrial fibrillation: Secondary | ICD-10-CM

## 2014-12-08 LAB — GLUCOSE, CAPILLARY
GLUCOSE-CAPILLARY: 140 mg/dL — AB (ref 65–99)
GLUCOSE-CAPILLARY: 123 mg/dL — AB (ref 65–99)
GLUCOSE-CAPILLARY: 114 mg/dL — AB (ref 65–99)
Glucose-Capillary: 135 mg/dL — ABNORMAL HIGH (ref 65–99)
Glucose-Capillary: 131 mg/dL — ABNORMAL HIGH (ref 65–99)

## 2014-12-08 LAB — BASIC METABOLIC PANEL
ANION GAP: 7 (ref 5–15)
BUN: 26 mg/dL — AB (ref 6–20)
CALCIUM: 8.5 mg/dL — AB (ref 8.9–10.3)
CO2: 27 mmol/L (ref 22–32)
CREATININE: 0.79 mg/dL (ref 0.44–1.00)
Chloride: 106 mmol/L (ref 101–111)
GFR calc Af Amer: >60 mL/min (ref >60)
GFR calc non Af Amer: >60 mL/min (ref >60)
GLUCOSE: 147 mg/dL — AB (ref 65–99)
Potassium: 3 mmol/L — ABNORMAL LOW (ref 3.5–5.1)
Sodium: 140 mmol/L (ref 135–145)

## 2014-12-08 LAB — CBC
HEMATOCRIT: 31.4 % — AB (ref 36.0–46.0)
Hemoglobin: 10.2 g/dL — ABNORMAL LOW (ref 12.0–15.0)
MCH: 29.4 pg (ref 26.0–34.0)
MCHC: 32.5 g/dL (ref 30.0–36.0)
MCV: 90.5 fL (ref 78.0–100.0)
Platelets: 211 10*3/uL (ref 150–400)
RBC: 3.47 MIL/uL — ABNORMAL LOW (ref 3.87–5.11)
RDW: 16.2 % — AB (ref 11.5–15.5)
WBC: 11.6 10*3/uL — ABNORMAL HIGH (ref 4.0–10.5)

## 2014-12-08 MED ORDER — FERROUS SULFATE 300 (60 FE) MG/5ML PO SYRP
300.0000 mg | ORAL_SOLUTION | Freq: Three times a day (TID) | ORAL | Status: DC
  Administered 2014-12-08 – 2014-12-10 (×5): 300 mg via ORAL
  Filled 2014-12-08 (×7): qty 5

## 2014-12-08 MED ORDER — LOPERAMIDE HCL 2 MG PO CAPS: 2.0000 mg | ORAL_CAPSULE | Freq: Once | ORAL | Status: DC

## 2014-12-08 MED ORDER — ENSURE ENLIVE PO LIQD
237.0000 mL | Freq: Four times a day (QID) | ORAL | Status: DC
  Administered 2014-12-08 – 2014-12-09 (×3): 237 mL

## 2014-12-08 MED ORDER — PREDNISONE 20 MG PO TABS: 20.0000 mg | ORAL_TABLET | Freq: Once | ORAL | Status: DC

## 2014-12-08 MED ORDER — VITAMIN C 500 MG/5ML PO SYRP
500.0000 mg | ORAL_SOLUTION | Freq: Two times a day (BID) | ORAL | Status: DC
Start: 2014-12-08 — End: 2014-12-10
  Administered 2014-12-08 – 2014-12-10 (×4): 500 mg via ORAL
  Filled 2014-12-08 (×4): qty 5

## 2014-12-08 MED ORDER — PREDNISONE 10 MG PO TABS
10.0000 mg | ORAL_TABLET | Freq: Once | ORAL | Status: DC
Start: 2014-12-12 — End: 2014-12-09

## 2014-12-08 MED ORDER — PREDNISONE 20 MG PO TABS
40.0000 mg | ORAL_TABLET | Freq: Once | ORAL | Status: DC
  Filled 2014-12-08: qty 2

## 2014-12-08 MED ORDER — PREDNISONE 20 MG PO TABS: 30.0000 mg | ORAL_TABLET | Freq: Once | ORAL | Status: DC

## 2014-12-08 MED ORDER — LOPERAMIDE HCL 2 MG PO CAPS: 2.0000 mg | ORAL_CAPSULE | Freq: Three times a day (TID) | ORAL | Status: DC | PRN

## 2014-12-08 MED ORDER — PREDNISONE 5 MG PO TABS: 5.0000 mg | ORAL_TABLET | Freq: Every day | ORAL | Status: DC

## 2014-12-08 MED ORDER — FERROUS SULFATE 325 (65 FE) MG PO TABS
325.0000 mg | ORAL_TABLET | Freq: Three times a day (TID) | ORAL | Status: DC
  Filled 2014-12-08: qty 1

## 2014-12-08 MED ORDER — BISMUTH SUBSALICYLATE 262 MG/15ML PO SUSP
30.0000 mL | Freq: Two times a day (BID) | ORAL | Status: DC
  Administered 2014-12-08 – 2014-12-10 (×5): 30 mL via ORAL
  Filled 2014-12-08 (×2): qty 118

## 2014-12-08 NOTE — Progress Notes (Signed)
Speech Language Pathology Treatment: Dysphagia  Patient Details Name: Julie OkaBettie Lomanto MRN: [id number] DOB: 03/11/1925 Today's Date: 12/08/2014 Time: [id number] SLP Time Calculation (min) (ACUTE ONLY): 25 min  Assessment / Plan / Recommendation Clinical Impression  Pt sitting upright in chair with ice chips at bedside.  SLP initiated swallowing exercises with pt to mitigate dysphagia but prognosis for functional swallow is guarded given chronicity of deficits prior to admit.   Pt consuming ice chips with ongoing throat clearing/cough and expectoration after swallow - ongoing severe dysphagia.  She is protecting her airway however.  Encouraged pt to continue ice chips for oral care and swallow musculature function.    Provided pt with lingual press to improve submandibular musculature strength and hyolaryngeal elevation, high pitched "I" to improve vocal fold adductors/laryngeal elevators, and effortful swallow.  Using demonstration with visual/verbal/tactile cues reviewed exercises and pt returned demonstration with mod I.  Encouraged pt to keep track of exercises completed by documenting on exercise form.   SLP to continue to follow for functional swallow exercises in hopes to allow pt even small amount of po in future for comfort.       HPI Other Pertinent Information: Pt is s/p XI robot assisted SIGMOID COLECTOMY, REPAIR OF COLOVAGINAL FISTULA,RIGID PROCTOSCOPY.   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 (overnight in 08/2013).  Pt reports using nectar thick liquids over the last year or so.  Swallow eval ordered as pt reports difficulty swallowing medicine - even when crushed.     Pertinent Vitals Pain Assessment: No/denies pain  SLP Plan  Continue with current plan of care    Recommendations Diet recommendations:  (ice chips prn) Liquids provided via: Teaspoon Supervision: Patient able to self feed Postural Changes  and/or Swallow Maneuvers: Seated upright 90 degrees;Upright 30-60 min after meal              Oral Care Recommendations: Oral care BID Follow up Recommendations: Skilled Nursing facility Plan: Continue with current plan of care    GO     Mills KollerKimball, Joory Gough Ann Aashika Carta, MS [facility] SLP [phone]

## 2014-12-08 NOTE — Progress Notes (Signed)
[facility]  [address] [place], [place] [address] Phone: [phone] FAX: [phone]   Julie Richardson [id number] [birth year]   Problem List:   Principal Problem:   Colovaginal fistula s/p robotic colectomy/repair 11/28/2014 Active Problems:   COPD (chronic obstructive pulmonary disease) (HCC)   GERD (gastroesophageal reflux disease)   Bronchiectasis with Pseudomonas without acute exacerbation (HCC)   Chronic antibiotic suppression - Azithromycin for chronic bronchiectasis with Pseudomonas   Hypokalemia   Hypomagnesemia   Protein-calorie malnutrition, moderate (HCC)   Dysphagia   Oropharyngeal dysphagia   10 Days Post-Op  11/28/2014  Procedure(s): XI ROBOT ASSISTED SIGMOID COLECTOMY, REPAIR OF COLOVAGINAL FISTULA,RIGID PROCTOSCOPY  Assessment  Severe dysphagia near UES of uncertain etiology  Anemia s/p transfusion - better  Diarrhea - most likely from TFs  Plan:  -severe dysphagia by history & fluoroscopy  - d/w SLP & ENT (Dr Constance Holster).  Barium swallow confirms this.    -ENT eval confirms.  Agree w Bevely Palmer.   D/w pt & her son.    Pt needs gastric feeding tube to help reliably get nutrition & meds enterally.  I suspect dysphagia will be a permanent problem & will not fully recover - but could improve w ENT/SLP help.  Pt has friend w G tube that is active/functional, and she & her son are very open to the idea of a G tube.  Patient is still somewhat active & independent so they remain reasonably aggressive.    She dislikes the idea of an NGT or ND feeding tube.  I agree that nasal tubes are a poor long term solution.    I called [facility] Wed AM to do EGD to evaluate esophagus & stomach & then do PEG by EGD as well.   Dr Paulita Fujita to d/w Dr Penelope Coop.  Dr Cristina Gong eventually d/w the next day & they declined PEG placement  [facility] Dr Pascal Lux able to place 10/13  Meds & TFs being started via Gtube    TFs at goal -   -ileus resolved  from colectomy/colovaginal fistula repair  -cycle TF to bolus cans 4-6 / day  -pathology benign - d/w pt/son & copy of report in the room  -hypoK - replace  -hypoMag - replaced  -IVFs - wean as TFs start  -changed narcotics w bad dreams & mild grogginess - MS better  -Afib & inc HR controlled w IV amiodarone - switch back to eneteral sotolol per cardiology & medicine  -HTN control - challenge w NPO.  switch back to eneteral sotolol  -chronic brochiectasis - switch back to enteral Azithromycin with h/o Pseudomonas bronchiectasis with WBC increasing   -anemia stable  -switch back to enteral prednisone  -VTE prophylaxis- SCDs, etc  -mobilize as tolerated to help recovery  D/C patient from hospital when patient meets criteria (anticipate in 2 day(s)):  Tolerating TFs well Ambulating well Adequate pain control without IV medications Urinating  Having flatus Disposition planning in place - plan to go to skilled nursing section of assisted living   I updated the patient's status to the patient & her son.  Recommendations were made.  Questions were answered.  The patient & RN expressed understanding & appreciation.   Adin Hector, M.D., F.A.C.S. Gastrointestinal and Minimally Invasive Surgery [facility] [address], [place], [place] [address] [phone] Main / Paging   12/08/2014  Subjective:  Mildly groggy from Gtube placement yest Hungry RN just outside door  Objective:  Vital signs:  Filed Vitals:   12/07/14 1948 12/07/14 2238 12/08/14 0228 12/08/14 0632  BP:  107/44 150/74 147/70  Pulse:  69 79 77  Temp:  98 F (36.7 C) 98 F (36.7 C) 98 F (36.7 C)  TempSrc:  Oral Oral Oral  Resp:  _0 Height:      Weight:    47.4 kg (104 lb 8 oz)  SpO2: 97% 97% 96% 96%    Last BM Date: 12/07/14  Intake/Output   Yesterday:  10/16 0701 - 10/17 0700 In: 1568.8 [I.V.:317.3; NG/GT:1161.5] Out: -  This  shift:  Total I/O In: 856.8 [I.V.:116.8; Other:90; NG/GT:650] Out: -   Bowel function:  Flatus: y  BM: y  Drain: n/a  Physical Exam:  General: Pt awake/alert/oriented x4 in no acute distress.  Mildly sleepy but wakes up well Eyes: PERRL, normal EOM.  Sclera clear.  No icterus Neuro: CN II-XII intact w/o focal sensory/motor deficits. Lymph: No head/neck/groin lymphadenopathy Psych:  No delerium/psychosis/paranoia HENT: Normocephalic, Mucus membranes dry.  No thrush.  Remains HOH Neck: Supple, No tracheal deviation Chest: No chest wall pain w good excursion CV:  Pulses intact.  Regular rhythm MS: Normal AROM mjr joints.  No obvious deformity Abdomen: Soft.  Nondistended.  Nontender at incisions.  No evidence of peritonitis.  No incarcerated hernias. Ext:  SCDs BLE.  No mjr edema.  No cyanosis Skin: No petechiae / purpura  Results:   Labs: Results for orders placed or performed during the hospital encounter of 11/28/14 (from the past 48 hour(s))  Prepare RBC     Status: None   Collection Time: 12/06/14  7:39 AM  Result Value Ref Range   Order Confirmation ORDER PROCESSED BY BLOOD BANK   Glucose, capillary     Status: Abnormal   Collection Time: 12/06/14  7:39 AM  Result Value Ref Range   Glucose-Capillary 127 (H) 65 - 99 mg/dL  Type and screen Antelope     Status: None   Collection Time: 12/06/14  8:09 AM  Result Value Ref Range   ABO/RH(D) O POS    Antibody Screen NEG    Sample Expiration 12/09/2014    Unit Number [id number]    Blood Component Type RED CELLS,LR    Unit division 00    Status of Unit ISSUED,FINAL    Transfusion Status OK TO TRANSFUSE    Crossmatch Result Compatible    Unit Number [id number]    Blood Component Type RBC LR PHER2    Unit division 00    Status of Unit ISSUED,FINAL    Transfusion Status OK TO TRANSFUSE    Crossmatch Result Compatible   Glucose, capillary     Status: Abnormal   Collection Time: 12/06/14 11:51  AM  Result Value Ref Range   Glucose-Capillary 155 (H) 65 - 99 mg/dL  Glucose, capillary     Status: Abnormal   Collection Time: 12/06/14  5:00 PM  Result Value Ref Range   Glucose-Capillary 144 (H) 65 - 99 mg/dL  MRSA PCR Screening     Status: Abnormal   Collection Time: 12/06/14  6:40 PM  Result Value Ref Range   MRSA by PCR POSITIVE (A) NEGATIVE    Comment:        The GeneXpert MRSA Assay (FDA approved for NASAL specimens only), is one component of a comprehensive MRSA colonization surveillance program. It is not intended to diagnose MRSA infection nor to guide or monitor treatment for MRSA infections.  RESULT CALLED TO, READ BACK BY AND VERIFIED WITH: HARRSTON,M/2W _0  ON 12/06/14 BY KARCZEWSKI,S.   Glucose, capillary     Status: Abnormal   Collection Time: 12/06/14  8:01 PM  Result Value Ref Range   Glucose-Capillary 141 (H) 65 - 99 mg/dL   Comment 1 Notify RN    Comment 2 Document in Chart   CBC     Status: Abnormal   Collection Time: 12/06/14  9:40 PM  Result Value Ref Range   WBC 6.1 4.0 - 10.5 K/uL   RBC 3.49 (L) 3.87 - 5.11 MIL/uL   Hemoglobin 10.2 (L) 12.0 - 15.0 g/dL    Comment: REPEATED TO VERIFY DELTA CHECK NOTED POST TRANSFUSION SPECIMEN    HCT 31.4 (L) 36.0 - 46.0 %   MCV 90.0 78.0 - 100.0 fL   MCH 29.2 26.0 - 34.0 pg   MCHC 32.5 30.0 - 36.0 g/dL   RDW 16.2 (H) 11.5 - 15.5 %   Platelets 173 150 - 400 K/uL  Glucose, capillary     Status: Abnormal   Collection Time: 12/06/14 11:36 PM  Result Value Ref Range   Glucose-Capillary 163 (H) 65 - 99 mg/dL   Comment 1 Notify RN    Comment 2 Document in Chart   CBC     Status: Abnormal   Collection Time: 12/07/14 12:22 AM  Result Value Ref Range   WBC 5.6 4.0 - 10.5 K/uL   RBC 3.46 (L) 3.87 - 5.11 MIL/uL   Hemoglobin 10.1 (L) 12.0 - 15.0 g/dL   HCT 31.1 (L) 36.0 - 46.0 %   MCV 89.9 78.0 - 100.0 fL   MCH 29.2 26.0 - 34.0 pg   MCHC 32.5 30.0 - 36.0 g/dL   RDW 16.4 (H) 11.5 - 15.5 %   Platelets 185  150 - 400 K/uL  Glucose, capillary     Status: Abnormal   Collection Time: 12/07/14  4:09 AM  Result Value Ref Range   Glucose-Capillary 204 (H) 65 - 99 mg/dL   Comment 1 Notify RN    Comment 2 Document in Chart   Basic metabolic panel     Status: Abnormal   Collection Time: 12/07/14  5:42 AM  Result Value Ref Range   Sodium 138 135 - 145 mmol/L   Potassium 4.2 3.5 - 5.1 mmol/L   Chloride 105 101 - 111 mmol/L   CO2 25 22 - 32 mmol/L   Glucose, Bld 222 (H) 65 - 99 mg/dL   BUN 32 (H) 6 - 20 mg/dL   Creatinine, Ser 0.69 0.44 - 1.00 mg/dL   Calcium 8.3 (L) 8.9 - 10.3 mg/dL   GFR calc non Af Amer >60 >60 mL/min   GFR calc Af Amer >60 >60 mL/min    Comment: (NOTE) The eGFR has been calculated using the CKD EPI equation. This calculation has not been validated in all clinical situations. eGFR's persistently <60 mL/min signify possible Chronic Kidney Disease.    Anion gap 8 5 - 15  Magnesium     Status: None   Collection Time: 12/07/14  5:42 AM  Result Value Ref Range   Magnesium 1.9 1.7 - 2.4 mg/dL  CBC     Status: Abnormal   Collection Time: 12/07/14  7:29 AM  Result Value Ref Range   WBC 6.7 4.0 - 10.5 K/uL   RBC 3.48 (L) 3.87 - 5.11 MIL/uL   Hemoglobin 10.1 (L) 12.0 - 15.0 g/dL   HCT 31.5 (L) 36.0 - 46.0 %  MCV 90.5 78.0 - 100.0 fL   MCH 29.0 26.0 - 34.0 pg   MCHC 32.1 30.0 - 36.0 g/dL   RDW 16.6 (H) 11.5 - 15.5 %   Platelets 186 150 - 400 K/uL  Glucose, capillary     Status: Abnormal   Collection Time: 12/07/14  7:38 AM  Result Value Ref Range   Glucose-Capillary 186 (H) 65 - 99 mg/dL   Comment 1 Notify RN    Comment 2 Document in Chart   Glucose, capillary     Status: Abnormal   Collection Time: 12/07/14 12:51 PM  Result Value Ref Range   Glucose-Capillary 183 (H) 65 - 99 mg/dL   Comment 1 Notify RN    Comment 2 Document in Chart   Glucose, capillary     Status: Abnormal   Collection Time: 12/07/14  4:39 PM  Result Value Ref Range   Glucose-Capillary 201 (H)  65 - 99 mg/dL  Glucose, capillary     Status: Abnormal   Collection Time: 12/07/14  8:43 PM  Result Value Ref Range   Glucose-Capillary 191 (H) 65 - 99 mg/dL  Glucose, capillary     Status: Abnormal   Collection Time: 12/07/14 11:29 PM  Result Value Ref Range   Glucose-Capillary 146 (H) 65 - 99 mg/dL  CBC     Status: Abnormal   Collection Time: 12/08/14  4:30 AM  Result Value Ref Range   WBC 11.6 (H) 4.0 - 10.5 K/uL   RBC 3.47 (L) 3.87 - 5.11 MIL/uL   Hemoglobin 10.2 (L) 12.0 - 15.0 g/dL   HCT 31.4 (L) 36.0 - 46.0 %   MCV 90.5 78.0 - 100.0 fL   MCH 29.4 26.0 - 34.0 pg   MCHC 32.5 30.0 - 36.0 g/dL   RDW 16.2 (H) 11.5 - 15.5 %   Platelets 211 150 - 400 K/uL  Basic metabolic panel     Status: Abnormal   Collection Time: 12/08/14  4:30 AM  Result Value Ref Range   Sodium 140 135 - 145 mmol/L   Potassium 3.0 (L) 3.5 - 5.1 mmol/L    Comment: DELTA CHECK NOTED REPEATED TO VERIFY    Chloride 106 101 - 111 mmol/L   CO2 27 22 - 32 mmol/L   Glucose, Bld 147 (H) 65 - 99 mg/dL   BUN 26 (H) 6 - 20 mg/dL   Creatinine, Ser 0.79 0.44 - 1.00 mg/dL   Calcium 8.5 (L) 8.9 - 10.3 mg/dL   GFR calc non Af Amer >60 >60 mL/min   GFR calc Af Amer >60 >60 mL/min    Comment: (NOTE) The eGFR has been calculated using the CKD EPI equation. This calculation has not been validated in all clinical situations. eGFR's persistently <60 mL/min signify possible Chronic Kidney Disease.    Anion gap 7 5 - 15  Glucose, capillary     Status: Abnormal   Collection Time: 12/08/14  4:36 AM  Result Value Ref Range   Glucose-Capillary 140 (H) 65 - 99 mg/dL    Imaging / Studies: No results found.  Medications / Allergies: per chart  Antibiotics: Anti-infectives    Start     Dose/Rate Route Frequency Ordered Stop   12/05/14 1000  azithromycin (ZITHROMAX) tablet 250 mg     250 mg Oral Daily 12/05/14 0746     12/01/14 1500  cefTAZidime (FORTAZ) 1 g in dextrose 5 % 50 mL IVPB  Status:  Discontinued     Comments:  When taking PO  well, switch to home azithromycin 241m PO daily   1 g 100 mL/hr over 30 Minutes Intravenous Every 12 hours 12/01/14 1332 12/05/14 0745   11/28/14 2300  cefoTEtan (CEFOTAN) 2 g in dextrose 5 % 50 mL IVPB     2 g 100 mL/hr over 30 Minutes Intravenous Every 12 hours 11/28/14 1736 11/28/14 2330   11/28/14 1532  clindamycin (CLEOCIN) 900 mg, gentamicin (GARAMYCIN) 240 mg in sodium chloride 0.9 % 1,000 mL for intraperitoneal lavage  Status:  Discontinued       As needed 11/28/14 1532 11/28/14 1535   11/28/14 1006  cefoTEtan (CEFOTAN) 2 g in dextrose 5 % 50 mL IVPB     2 g 100 mL/hr over 30 Minutes Intravenous On call to O.R. 11/28/14 1006 11/28/14 1305   11/28/14 0600  clindamycin (CLEOCIN) 900 mg, gentamicin (GARAMYCIN) 240 mg in sodium chloride 0.9 % 1,000 mL for intraperitoneal lavage  Status:  Discontinued    Comments:  Pharmacy may adjust dosing strength, schedule, rate of infusion, etc as needed to optimize therapy    Intraperitoneal To Surgery 11/27/14 1323 11/28/14 1643        Note: Portions of this report may have been transcribed using voice recognition software. Every effort was made to ensure accuracy; however, inadvertent computerized transcription errors may be present.   Any transcriptional errors that result from this process are unintentional.     SAdin Hector M.D., F.A.C.S. Gastrointestinal and Minimally Invasive Surgery [facility] [address] [place] [phone] / Paging   12/08/2014  CARE TEAM:  PCP: PWillodean Rosenthal MD  Outpatient Care Team: Patient Care Team: JWillodean Rosenthal MD as PCP - General (Internal Medicine) SBjorn Loser MD as Consulting Physician (Urology) SMichael Boston MD as Consulting Physician (General Surgery) JTeena Irani MD as Consulting Physician (Gastroenterology)  Inpatient Treatment Team: Treatment Team: Attending Provider: SMichael Boston MD;  Registered Nurse: LBailey Mech RN; Rounding Team: WGarner Gavel MD; Consulting Physician: SWonda Horner MD; Technician: RMilagros Reap NT; Consulting Physician: WArta Silence MD; Consulting Physician: RMichae KavaLbcardiology, MD; Registered Nurse: MMeredith Mody RN; Registered Nurse: ETacey Ruiz RN; Technician: CMertha Baars NT; Registered Nurse: KPatria Mane RN; Technician: ADelfino Lovett NT; Technician: DAnnette Stable NT

## 2014-12-08 NOTE — Care Management Important Message (Signed)
Important Message  Patient Details IM Letter given to Kathy/Case Manager to present to PatientImportant Message  Patient Details  Name: Julie Richardson MRN: [id number] Date of Birth: 01/17/1926   Medicare Important Message Given:  Yes-fourth notification given    Haskell FlirtJamison, Brayant Dorr 12/08/2014, 12:08 PM Name: Julie Richardson MRN: [id number] Date of Birth: 10/01/1925   Medicare Important Message Given:  Yes-fourth notification given    Haskell FlirtJamison, Shawan Tosh 12/08/2014, 12:08 PM

## 2014-12-08 NOTE — Progress Notes (Signed)
Physical Therapy Treatment Patient Details Name: Julie OkaBettie Richardson MRN: [id number] DOB: 01/15/1926 Today's Date: 12/08/2014    History of Present Illness Pt is an 81 year old female s/p XI ROBOT ASSISTED SIGMOID COLECTOMY, REPAIR OF COLOVAGINAL FISTULA,RIGID PROCTOSCOPY on 11/28/14. 11/30/14: Pt with new onset of dysarthric speech since yesterday. Pt status post percutaneous gastrostomy tube on 10/13    PT Comments    Improved activity tolerance today. Pt walked 200' with RW and min/guard assist, no loss of balance.   Follow Up Recommendations  Home health PT;Supervision for mobility/OOB     Equipment Recommendations  None recommended by PT    Recommendations for Other Services       Precautions / Restrictions Precautions Precautions: Fall Precaution Comments: G tube Restrictions Weight Bearing Restrictions: No    Mobility  Bed Mobility Overal bed mobility: Modified Independent             General bed mobility comments: increased time  Transfers Overall transfer level: Needs assistance Equipment used: Rolling walker (2 wheeled)   Sit to Stand: Min assist         General transfer comment: verbal cues for hand placement, assist to rise and steady  Ambulation/Gait Ambulation/Gait assistance: Min guard Ambulation Distance (Feet): 200 Feet Assistive device: Rolling walker (2 wheeled) Gait Pattern/deviations: Step-through pattern;Decreased stride length   Gait velocity interpretation: at or above normal speed for age/gender General Gait Details: significant increase in gait distance today, steady with RW, HR 90 with walking   Stairs            Wheelchair Mobility    Modified Rankin (Stroke Patients Only)       Balance Overall balance assessment: Modified Independent                                  Cognition Arousal/Alertness: Awake/alert Behavior During Therapy: WFL for tasks assessed/performed Overall Cognitive Status: Within  Functional Limits for tasks assessed                      Exercises      General Comments        Pertinent Vitals/Pain Pain Assessment: No/denies pain    Home Living                      Prior Function            PT Goals (current goals can now be found in the care plan section) Acute Rehab PT Goals Patient Stated Goal: to return to [facility] Time For Goal Achievement: 12/13/14 Potential to Achieve Goals: Good    Frequency  Min 3X/week    PT Plan Current plan remains appropriate    Co-evaluation             End of Session Equipment Utilized During Treatment: Oxygen Activity Tolerance: Patient tolerated treatment well Patient left: with call bell/phone within reach;in chair     Time: [id number] PT Time Calculation (min) (ACUTE ONLY): 20 min  Charges:  $Gait Training: 8-22 mins                    G Codes:      Tamala SerUhlenberg, Alan Riles Kistler 12/08/2014, 1:27 PM [phone]

## 2014-12-08 NOTE — Care Management Note (Signed)
Case Management Note  Patient Details  Name: Jamison OkaBettie Rainwater MRN: [id number] Date of Birth: 05/22/1925  Subjective/Objective: cardio-amiodarone gtt.sx-POD#10 colectomy.Severe dysphagia.GT-ensure.await TF.From ALF, will need SNF.                   Action/Plan:d/c plan SNF.   Expected Discharge Date:                  Expected Discharge Plan:  Skilled Nursing Facility  In-House Referral:  Clinical Social Work  Discharge planning Services  CM Consult  Post Acute Care Choice:    Choice offered to:     DME Arranged:    DME Agency:     HH Arranged:    HH Agency:     Status of Service:  Completed, signed off  Medicare Important Message Given:  Yes-fourth notification given Date Medicare IM Given:    Medicare IM give by:    Date Additional Medicare IM Given:    Additional Medicare Important Message give by:     If discussed at Long Length of Stay Meetings, dates discussed:    Additional Comments:  Lanier ClamMahabir, Breydon Senters, RN 12/08/2014, 3:52 PM

## 2014-12-08 NOTE — Progress Notes (Signed)
Patient Profile: 81 y/o female with h/o PAF, not an anticoagulation candidate, who was admitted for surgery for treatment of a colovaginal fistula. Post-op course complicated by atrial fibrillation w/ RVR.   Subjective: Complains of  dysphagia. No other complaints. Receiving breathing treatment. Her son is by her bedside.    Objective: Vital signs in last 24 hours: Temp:  [97.4 F (36.3 C)-98 F (36.7 C)] 98 F (36.7 C) (10/17 9604) Pulse Rate:  [65-79] 77 (10/17 0632) Resp:  [20-23] 20 (10/17 5409) BP: (107-150)/(44-74) 147/70 mmHg (10/17 0632) SpO2:  [94 %-98 %] 96 % (10/17 8119) Weight:  [104 lb 8 oz (47.4 kg)] 104 lb 8 oz (47.4 kg) (10/17 1478) Last BM Date: 12/07/14  Intake/Output from previous day: 10/16 0701 - 10/17 0700 In: 1568.8 [I.V.:317.3; NG/GT:1161.5] Out: -  Intake/Output this shift:    Medications Current Facility-Administered Medications  Medication Dose Route Frequency Provider Last Rate Last Dose  . 0.9 %  sodium chloride infusion  250 mL Intravenous PRN Karie Soda, MD      . 0.9 %  sodium chloride infusion  250 mL Intravenous PRN Karie Soda, MD 10 mL/hr at 12/06/14 2128 250 mL at 12/06/14 2128  . amiodarone (NEXTERONE PREMIX) 360 MG/200ML (1.8 mg/mL) IV infusion  30 mg/hr Intravenous Continuous Dorothea Ogle, MD 16.7 mL/hr at 12/08/14 0458 30 mg/hr at 12/08/14 0458  . antiseptic oral rinse (BIOTENE) solution 15 mL  15 mL Mouth Rinse TID Karie Soda, MD   15 mL at 12/07/14 2139  . atorvastatin (LIPITOR) tablet 20 mg  20 mg Oral QHS Karie Soda, MD   20 mg at 12/07/14 2156  . azithromycin (ZITHROMAX) tablet 250 mg  250 mg Oral Daily Karie Soda, MD   250 mg at 12/07/14 1014  . Chlorhexidine Gluconate Cloth 2 % PADS 6 each  6 each Topical Q0600 Karie Soda, MD   6 each at 12/08/14 0458  . cholecalciferol (VITAMIN D) tablet 2,000 Units  2,000 Units Oral Daily Karie Soda, MD   2,000 Units at 12/07/14 1012  . cycloSPORINE (RESTASIS) 0.05 %  ophthalmic emulsion 1 drop  1 drop Both Eyes BID Karie Soda, MD   1 drop at 12/07/14 2156  . diltiazem (CARDIZEM) injection 10 mg  10 mg Intravenous Once Zannie Cove, MD      . diphenhydrAMINE (BENADRYL) injection 12.5 mg  12.5 mg Intravenous Q6H PRN Karie Soda, MD   12.5 mg at 12/08/14 0245  . erythromycin ophthalmic ointment 1 application  1 application Both Eyes QHS Karie Soda, MD   1 application at 12/07/14 2137  . feeding supplement (JEVITY 1.2 CAL) liquid 1,000 mL  1,000 mL Per Tube Continuous Renie Ora, RD 45 mL/hr at 12/07/14 1442    . fentaNYL (SUBLIMAZE) injection 25-50 mcg  25-50 mcg Intravenous Q2H PRN Karie Soda, MD      . free water 100 mL  100 mL Per Tube 3 times per day Renie Ora, RD   100 mL at 12/07/14 2139  . glucagon (GLUCAGEN) injection   Intravenous PRN Simonne Come, MD   1 mg at 12/04/14 1600  . hydrocortisone (ANUSOL-HC) 2.5 % rectal cream 1 application  1 application Rectal PRN Karie Soda, MD      . hydrocortisone sodium succinate (SOLU-CORTEF) 100 MG injection 50 mg  50 mg Intravenous Daily Dorothea Ogle, MD      . insulin aspart (novoLOG) injection 0-9 Units  0-9 Units Subcutaneous 6 times per  day Rolan Lipa, NP   1 Units at 12/08/14 0458  . iohexol (OMNIPAQUE) 300 MG/ML solution 20 mL  20 mL Other Once PRN Medication Radiologist, MD   20 mL at 12/04/14 1617  . ipratropium-albuterol (DUONEB) 0.5-2.5 (3) MG/3ML nebulizer solution 3 mL  3 mL Nebulization BID Karie Soda, MD   3 mL at 12/07/14 1947  . lidocaine (XYLOCAINE) 2 % jelly 1 application  1 application Topical Once PRN Serena Colonel, MD      . lidocaine (XYLOCAINE) 4 % external solution 0-50 mL  0-50 mL Topical Once PRN Serena Colonel, MD      . lip balm (CARMEX) ointment 1 application  1 application Topical BID Karie Soda, MD   1 application at 12/07/14 2139  . menthol-cetylpyridinium (CEPACOL) lozenge 3 mg  1 lozenge Oral PRN Karie Soda, MD      . metoprolol (LOPRESSOR)  injection 5 mg  5 mg Intravenous Q4H PRN Zannie Cove, MD   5 mg at 12/05/14 2208  . mupirocin ointment (BACTROBAN) 2 % 1 application  1 application Nasal BID Karie Soda, MD   1 application at 12/07/14 2137  . ondansetron (ZOFRAN) injection 4 mg  4 mg Intravenous Q6H PRN Karie Soda, MD      . oxymetazoline (AFRIN) 0.05 % nasal spray 1 spray  1 spray Each Nare Once PRN Serena Colonel, MD      . pantoprazole sodium (PROTONIX) 40 mg/20 mL oral suspension 80 mg  80 mg Per Tube Daily Karie Soda, MD   80 mg at 12/07/14 1011  . phenol (CHLORASEPTIC) mouth spray 2 spray  2 spray Mouth/Throat PRN Karie Soda, MD      . pneumococcal 23 valent vaccine (PNU-IMMUNE) injection 0.5 mL  0.5 mL Intramuscular Tomorrow-1000 Karie Soda, MD      . polyvinyl alcohol (LIQUIFILM TEARS) 1.4 % ophthalmic solution 1 drop  1 drop Both Eyes PRN Karie Soda, MD      . potassium chloride SA (K-DUR,KLOR-CON) CR tablet 20 mEq  20 mEq Oral Q4H PRN Karie Soda, MD      . promethazine (PHENERGAN) injection 6.25-12.5 mg  6.25-12.5 mg Intravenous Q4H PRN Karie Soda, MD      . RESOURCE THICKENUP CLEAR   Oral TID [facility] Karie Soda, MD   Stopped at 11/29/14 1200  . saccharomyces boulardii (FLORASTOR) capsule 250 mg  250 mg Oral BID Karie Soda, MD   250 mg at 12/07/14 2140  . sodium chloride 0.9 % injection 3 mL  3 mL Intravenous Q12H Karie Soda, MD   3 mL at 12/07/14 2138  . sodium chloride 0.9 % injection 3 mL  3 mL Intravenous PRN Karie Soda, MD      . sodium chloride 0.9 % injection 3 mL  3 mL Intravenous Q12H Karie Soda, MD   3 mL at 12/06/14 2155  . sodium chloride 0.9 % injection 3 mL  3 mL Intravenous PRN Karie Soda, MD      . traMADol Janean Sark) tablet 50-100 mg  50-100 mg Oral Q6H PRN Karie Soda, MD      . vitamin C (ASCORBIC ACID) tablet 500 mg  500 mg Oral BID Dorothea Ogle, MD   500 mg at 12/07/14 2156    PE: General appearance: alert, cooperative and no distress Neck: no carotid bruit and no  JVD Lungs: clear to auscultation bilaterally Heart: regular rate and rhythm Extremities: no LEE Pulses: 2+ and symmetric Skin: warm and dry Neurologic: Grossly normal  Lab Results:   Recent Labs  12/07/14 0022 12/07/14 0729 12/08/14 0430  WBC 5.6 6.7 11.6*  HGB 10.1* 10.1* 10.2*  HCT 31.1* 31.5* 31.4*  PLT 185 186 211   BMET  Recent Labs  12/06/14 0630 12/07/14 0542 12/08/14 0430  NA 138 138 140  K 4.2 4.2 3.0*  CL 105 105 106  CO2 26 25 27   GLUCOSE 145* 222* 147*  BUN 36* 32* 26*  CREATININE 0.78 0.69 0.79  CALCIUM 8.7* 8.3* 8.5*     Assessment/Plan  Principal Problem:   Colovaginal fistula s/p robotic colectomy/repair 11/28/2014 Active Problems:   COPD (chronic obstructive pulmonary disease) (HCC)   GERD (gastroesophageal reflux disease)   Bronchiectasis with Pseudomonas without acute exacerbation (HCC)   Chronic antibiotic suppression - Azithromycin for chronic bronchiectasis with Pseudomonas   Hypokalemia   Hypomagnesemia   Protein-calorie malnutrition, moderate (HCC)   Dysphagia   Oropharyngeal dysphagia   1. PAF: recurrence in the setting of recent surgery. Now maintaining NSR on telemetry. HR well controlled in the 60s. Remains on IV meds including IV amiodarone and Cardizem. Probably transition to amiodarone tablets via gastrostomy tube today, if ok with surgery. Not an anticoagulant candidate.   2. Hypokaliemia: K is 3.0 today. Repleat with supplemental K. Monitor.  3. Colovaginal Fistula: Day 10 s/p robotic colectomy/repair.     LOS: 10 days    Brittainy M. Delmer IslamSimmons, PA-C 12/08/2014 7:38 AM   Attending Note:   The patient was seen and examined.  Agree with assessment and plan as noted above.  Changes made to the above note as needed.  Pt has maintained NSR. Will anticipate changing to PO amio once she is eating .  Doing very well    Vesta MixerPhilip J. Zubair Lofton, Montez HagemanJr., MD, [facility] 12/08/2014, 12:50 PM [address],  [address] Office [phone]  [phone] Pager [phone]

## 2014-12-09 DIAGNOSIS — E876 Hypokalemia: Secondary | ICD-10-CM

## 2014-12-09 DIAGNOSIS — E440 Moderate protein-calorie malnutrition: Secondary | ICD-10-CM

## 2014-12-09 DIAGNOSIS — I9589 Other hypotension: Secondary | ICD-10-CM

## 2014-12-09 LAB — CBC
HCT: 32.8 % — ABNORMAL LOW (ref 36.0–46.0)
HEMOGLOBIN: 10.4 g/dL — AB (ref 12.0–15.0)
MCH: 29.6 pg (ref 26.0–34.0)
MCHC: 31.7 g/dL (ref 30.0–36.0)
MCV: 93.4 fL (ref 78.0–100.0)
PLATELETS: 240 10*3/uL (ref 150–400)
RBC: 3.51 MIL/uL — AB (ref 3.87–5.11)
RDW: 16.7 % — ABNORMAL HIGH (ref 11.5–15.5)
WBC: 9.5 10*3/uL (ref 4.0–10.5)

## 2014-12-09 LAB — GLUCOSE, CAPILLARY
GLUCOSE-CAPILLARY: 111 mg/dL — AB (ref 65–99)
GLUCOSE-CAPILLARY: 146 mg/dL — AB (ref 65–99)
Glucose-Capillary: 116 mg/dL — ABNORMAL HIGH (ref 65–99)
Glucose-Capillary: 120 mg/dL — ABNORMAL HIGH (ref 65–99)
Glucose-Capillary: 122 mg/dL — ABNORMAL HIGH (ref 65–99)
Glucose-Capillary: 85 mg/dL (ref 65–99)

## 2014-12-09 LAB — BASIC METABOLIC PANEL
ANION GAP: 9 (ref 5–15)
BUN: 27 mg/dL — ABNORMAL HIGH (ref 6–20)
CHLORIDE: 102 mmol/L (ref 101–111)
CO2: 31 mmol/L (ref 22–32)
Calcium: 8.5 mg/dL — ABNORMAL LOW (ref 8.9–10.3)
Creatinine, Ser: 0.66 mg/dL (ref 0.44–1.00)
Glucose, Bld: 124 mg/dL — ABNORMAL HIGH (ref 65–99)
POTASSIUM: 3 mmol/L — AB (ref 3.5–5.1)
SODIUM: 142 mmol/L (ref 135–145)

## 2014-12-09 LAB — MAGNESIUM: MAGNESIUM: 1.8 mg/dL (ref 1.7–2.4)

## 2014-12-09 MED ORDER — POTASSIUM CHLORIDE 10 MEQ/100ML IV SOLN
10.0000 meq | INTRAVENOUS | Status: DC
  Administered 2014-12-09: 10 meq via INTRAVENOUS
  Filled 2014-12-09: qty 100

## 2014-12-09 MED ORDER — SODIUM CHLORIDE 0.9 % IJ SOLN
3.0000 mL | Freq: Two times a day (BID) | INTRAMUSCULAR | Status: DC
Start: 2014-12-09 — End: 2014-12-10
  Administered 2014-12-09 – 2014-12-10 (×3): 3 mL via INTRAVENOUS

## 2014-12-09 MED ORDER — JEVITY 1.2 CAL PO LIQD
237.0000 mL | Freq: Four times a day (QID) | ORAL | Status: DC
  Administered 2014-12-09 – 2014-12-10 (×4): 237 mL
  Filled 2014-12-09 (×5): qty 237

## 2014-12-09 MED ORDER — AMIODARONE HCL IN DEXTROSE 360-4.14 MG/200ML-% IV SOLN
30.0000 mg/h | INTRAVENOUS | Status: AC
  Filled 2014-12-09: qty 200

## 2014-12-09 MED ORDER — SODIUM CHLORIDE 0.9 % IJ SOLN: 3.0000 mL | INTRAMUSCULAR | Status: DC | PRN

## 2014-12-09 MED ORDER — FREE WATER
100.0000 mL | Freq: Four times a day (QID) | Status: DC
  Administered 2014-12-09 – 2014-12-10 (×3): 100 mL

## 2014-12-09 MED ORDER — SODIUM CHLORIDE 0.9 % IV SOLN: 250.0000 mL | INTRAVENOUS | Status: DC | PRN

## 2014-12-09 MED ORDER — AMIODARONE HCL 200 MG PO TABS
200.0000 mg | ORAL_TABLET | Freq: Every day | ORAL | Status: DC
  Administered 2014-12-09 – 2014-12-10 (×2): 200 mg via ORAL
  Filled 2014-12-09 (×2): qty 1

## 2014-12-09 NOTE — Progress Notes (Signed)
Patient Profile: 81 y/o female with h/o PAF, not an anticoagulation candidate, who was admitted for surgery for treatment of a colovaginal fistula. Post-op course complicated by atrial fibrillation w/ RVR.   Subjective: Still with some issues with dysphagia. Unable to swallow medications. No other complaints.   Objective: Vital signs in last 24 hours: Temp:  [97.7 F (36.5 C)-98.9 F (37.2 C)] 98.9 F (37.2 C) (10/18 0601) Pulse Rate:  [65-73] 72 (10/18 0601) Resp:  [18-20] 18 (10/18 0601) BP: (146-161)/(61-74) 147/72 mmHg (10/18 0601) SpO2:  [93 %-99 %] 99 % (10/18 0601) Weight:  [102 lb 8.2 oz (46.5 kg)] 102 lb 8.2 oz (46.5 kg) (10/18 0601) Last BM Date: 12/08/14  Intake/Output from previous day: 10/17 0701 - 10/18 0700 In: 1648.2 [I.V.:881.2; NG/GT:707] Out: -  Intake/Output this shift: Total I/O In: 100 [IV Piggyback:100] Out: -   Medications Current Facility-Administered Medications  Medication Dose Route Frequency Provider Last Rate Last Dose  . 0.9 %  sodium chloride infusion  250 mL Intravenous PRN Karie Soda, MD      . 0.9 %  sodium chloride infusion  250 mL Intravenous PRN Karie Soda, MD 10 mL/hr at 12/06/14 2128 250 mL at 12/06/14 2128  . 0.9 %  sodium chloride infusion  250 mL Intravenous PRN Karie Soda, MD      . amiodarone (NEXTERONE PREMIX) 360 MG/200ML (1.8 mg/mL) IV infusion  30 mg/hr Intravenous Continuous Karie Soda, MD      . amiodarone (PACERONE) tablet 200 mg  200 mg Oral Daily Karie Soda, MD      . antiseptic oral rinse (BIOTENE) solution 15 mL  15 mL Mouth Rinse TID Karie Soda, MD   15 mL at 12/08/14 2321  . ascorbic acid (VITAMIN C) 500 MG/5ML syrup 500 mg  500 mg Oral BID Karie Soda, MD   500 mg at 12/08/14 2322  . atorvastatin (LIPITOR) tablet 20 mg  20 mg Oral QHS Karie Soda, MD   20 mg at 12/08/14 2323  . azithromycin (ZITHROMAX) tablet 250 mg  250 mg Oral Daily Karie Soda, MD   250 mg at 12/08/14 1610  . bismuth  subsalicylate (PEPTO BISMOL) 262 MG/15ML suspension 30 mL  30 mL Oral BID Karie Soda, MD   30 mL at 12/08/14 1804  . Chlorhexidine Gluconate Cloth 2 % PADS 6 each  6 each Topical Q0600 Karie Soda, MD   6 each at 12/09/14 [phone]  . cholecalciferol (VITAMIN D) tablet 2,000 Units  2,000 Units Oral Daily Karie Soda, MD   2,000 Units at 12/08/14 [phone]  . cycloSPORINE (RESTASIS) 0.05 % ophthalmic emulsion 1 drop  1 drop Both Eyes BID Karie Soda, MD   1 drop at 12/08/14 2319  . diltiazem (CARDIZEM) injection 10 mg  10 mg Intravenous Once Zannie Cove, MD      . diphenhydrAMINE (BENADRYL) injection 12.5 mg  12.5 mg Intravenous Q6H PRN Karie Soda, MD   12.5 mg at 12/08/14 0245  . erythromycin ophthalmic ointment 1 application  1 application Both Eyes QHS Karie Soda, MD   1 application at 12/08/14 2319  . feeding supplement (ENSURE ENLIVE) (ENSURE ENLIVE) liquid 237 mL  237 mL Per Tube QID Karie Soda, MD   237 mL at 12/08/14 2323  . fentaNYL (SUBLIMAZE) injection 25-50 mcg  25-50 mcg Intravenous Q2H PRN Karie Soda, MD      . ferrous sulfate 300 (60 FE) MG/5ML syrup 300 mg  300 mg Oral TID WC Karie Soda,  MD   300 mg at 12/08/14 1803  . free water 100 mL  100 mL Per Tube 3 times per day Renie Ora, RD   100 mL at 12/09/14 0600  . glucagon (GLUCAGEN) injection   Intravenous PRN Simonne Come, MD   1 mg at 12/04/14 1600  . hydrocortisone (ANUSOL-HC) 2.5 % rectal cream 1 application  1 application Rectal PRN Karie Soda, MD      . insulin aspart (novoLOG) injection 0-9 Units  0-9 Units Subcutaneous 6 times per day Rolan Lipa, NP   2 Units at 12/08/14 2316  . iohexol (OMNIPAQUE) 300 MG/ML solution 20 mL  20 mL Other Once PRN Medication Radiologist, MD   20 mL at 12/04/14 1617  . ipratropium-albuterol (DUONEB) 0.5-2.5 (3) MG/3ML nebulizer solution 3 mL  3 mL Nebulization BID Karie Soda, MD   3 mL at 12/08/14 2024  . lidocaine (XYLOCAINE) 2 % jelly 1 application  1 application  Topical Once PRN Serena Colonel, MD      . lidocaine (XYLOCAINE) 4 % external solution 0-50 mL  0-50 mL Topical Once PRN Serena Colonel, MD      . lip balm (CARMEX) ointment 1 application  1 application Topical BID Karie Soda, MD   1 application at 12/08/14 2320  . loperamide (IMODIUM) capsule 2-4 mg  2-4 mg Oral Once Karie Soda, MD   2 mg at 12/08/14 0800  . menthol-cetylpyridinium (CEPACOL) lozenge 3 mg  1 lozenge Oral PRN Karie Soda, MD      . metoprolol (LOPRESSOR) injection 5 mg  5 mg Intravenous Q4H PRN Zannie Cove, MD   5 mg at 12/05/14 2208  . mupirocin ointment (BACTROBAN) 2 % 1 application  1 application Nasal BID Karie Soda, MD   1 application at 12/08/14 2317  . ondansetron (ZOFRAN) injection 4 mg  4 mg Intravenous Q6H PRN Karie Soda, MD      . oxymetazoline (AFRIN) 0.05 % nasal spray 1 spray  1 spray Each Nare Once PRN Serena Colonel, MD      . pantoprazole sodium (PROTONIX) 40 mg/20 mL oral suspension 80 mg  80 mg Per Tube Daily Karie Soda, MD   80 mg at 12/08/14 0921  . phenol (CHLORASEPTIC) mouth spray 2 spray  2 spray Mouth/Throat PRN Karie Soda, MD      . pneumococcal 23 valent vaccine (PNU-IMMUNE) injection 0.5 mL  0.5 mL Intramuscular Tomorrow-1000 Karie Soda, MD   0.5 mL at 12/08/14 1000  . polyvinyl alcohol (LIQUIFILM TEARS) 1.4 % ophthalmic solution 1 drop  1 drop Both Eyes PRN Karie Soda, MD      . potassium chloride SA (K-DUR,KLOR-CON) CR tablet 20 mEq  20 mEq Oral Q4H PRN Karie Soda, MD      . Melene Muller ON 12/13/2014] predniSONE (DELTASONE) tablet 5 mg  5 mg Oral Q breakfast Dorothea Ogle, MD      . promethazine (PHENERGAN) injection 6.25-12.5 mg  6.25-12.5 mg Intravenous Q4H PRN Karie Soda, MD      . RESOURCE THICKENUP CLEAR   Oral TID [facility] Karie Soda, MD   Stopped at 11/29/14 1200  . saccharomyces boulardii (FLORASTOR) capsule 250 mg  250 mg Oral BID Karie Soda, MD   250 mg at 12/08/14 2322  . sodium chloride 0.9 % injection 3 mL  3 mL Intravenous Q12H  Karie Soda, MD   3 mL at 12/08/14 1000  . sodium chloride 0.9 % injection 3 mL  3 mL Intravenous PRN  Karie SodaSteven Gross, MD      . sodium chloride 0.9 % injection 3 mL  3 mL Intravenous Q12H Karie SodaSteven Gross, MD   3 mL at 12/06/14 2155  . sodium chloride 0.9 % injection 3 mL  3 mL Intravenous PRN Karie SodaSteven Gross, MD      . sodium chloride 0.9 % injection 3 mL  3 mL Intravenous Q12H Karie SodaSteven Gross, MD      . sodium chloride 0.9 % injection 3 mL  3 mL Intravenous PRN Karie SodaSteven Gross, MD      . traMADol Janean Sark(ULTRAM) tablet 50-100 mg  50-100 mg Oral Q6H PRN Karie SodaSteven Gross, MD   50 mg at 12/08/14 2323    PE: General appearance: alert, cooperative and no distress Neck: no carotid bruit and no JVD Lungs: clear to auscultation bilaterally Heart: regular rate and rhythm Extremities: no LEE Pulses: 2+ and symmetric Skin: warm and dry Neurologic: Grossly normal  Lab Results:   Recent Labs  12/07/14 0729 12/08/14 0430 12/09/14 0415  WBC 6.7 11.6* 9.5  HGB 10.1* 10.2* 10.4*  HCT 31.5* 31.4* 32.8*  PLT 186 211 240   BMET  Recent Labs  12/07/14 0542 12/08/14 0430 12/09/14 0415  NA 138 140 142  K 4.2 3.0* 3.0*  CL 105 106 102  CO2 25 27 31   GLUCOSE 222* 147* 124*  BUN 32* 26* 27*  CREATININE 0.69 0.79 0.66  CALCIUM 8.3* 8.5* 8.5*     Assessment/Plan  Principal Problem:   Colovaginal fistula s/p robotic colectomy/repair 11/28/2014 Active Problems:   COPD (chronic obstructive pulmonary disease) (HCC)   GERD (gastroesophageal reflux disease)   Bronchiectasis with Pseudomonas without acute exacerbation (HCC)   Chronic antibiotic suppression - Azithromycin for chronic bronchiectasis with Pseudomonas   Hypokalemia   Hypomagnesemia   Protein-calorie malnutrition, moderate (HCC)   Dysphagia   Oropharyngeal dysphagia   1. PAF: recurrence in the setting of recent surgery. Now maintaining NSR on telemetry. HR well controlled in the 70s. Remains on IV meds including IV amiodarone and Cardizem.  Transition to amiodarone tablets via gastrostomy tube today. Not an anticoagulant candidate.   2. Hypokaliemia: K is 3.0 today. Repleat with supplemental K. 10 mEq IV Q1H x 3 ordered. Monitor. Will also check Mg.   3. Colovaginal Fistula: Day 11 s/p robotic colectomy/repair.  4. Dysphagia: speech therapy following. Diet recommendations are for ice chips PRN. Receiving nutritional  supplements and meds via gastrostomy tube and IV.      LOS: 11 days    Brittainy M. Delmer IslamSimmons, PA-C 12/09/2014 7:54 AM  Attending Note:   The patient was seen and examined.  Agree with assessment and plan as noted above.  Changes made to the above note as needed.  Has been changed to PO amiodarone .  Will sign off.  Call for questions   Alvia GrovePhilip J. Tywaun Hiltner, Jr., MD, [facility] 12/09/2014, 12:44 PM [address],  [address] Office [phone]- [phone] Pager [phone]

## 2014-12-09 NOTE — Consult Note (Signed)
[facility] PROGRESS NOTE  Julie OkaBettie Richardson ZOX:[id number] DOB: 10/11/1925 DOA: 11/28/2014 PCP: Darlina GuysPOWELL, JERRY, MD  Brief narrative:    81 yo female with chronic dysphagia, stroke 7 yrs ago, RA on chronic prednisone, remote paroxysmal [facility]-fib but [facility], colo-vaginal fistula who was admitted 10/07 under surgery team for sigmoid colectomy on 10/7. [facility] was consulted for evaluation of slurred speech.  Major events since admission: 10/15 - hypotensive, requiring transfer to SDU 10/16 - stable, in sinus rhythm   Assessment/Plan:    Principal Problem:  Colovaginal fistula s/p robotic colectomy/repair 11/28/2014 - Management per primary team  Active Problems:  Chronic dysphagia with aspiration - ENT seen in consultation, pt at high risk for aspiration as there is a significant sensorimotor defect in initiating swallow - S/P PEG tube placement  - She is getting Ensure only through PEG, cannot tolerate PO at all  - Use free water flushes every 8 hours 100 cc - Aspiration precautions    Atrial fibrillation, paroxysmal   - CHADS vasc score at least 3 (age, gender) - Has been seen by cardio - Rate controlled with amiodarone   Hypotension  - Likely exacerbated by blood loss and use of sotalol  - Pt was on solumedrol while inpatient and was transitioned to Prednisone 5 mg PO 10/14 - Per surgery, ok to continue prednisone 5 mg daily   Acute on chronic blood loss anemia - Hg drop 10/14 - Transfused 2 U PRBC 10/15, Hg now stable ~10   COPD (chronic obstructive pulmonary disease) (HCC) - Stable - Not in acute exacerbation    GERD (gastroesophageal reflux disease) - Continue Protonix    Bronchiectasis with Pseudomonas without acute exacerbation (HCC) - Continue home medical regimen with Zithromax    Hypokalemia / Hypomagnesemia - Supplemented    Protein-calorie malnutrition, moderate (HCC), underweight  - Gastrostomy tube placed and TF started  - Body mass  index is 17.53    RA on chronic Prednisone - Continue low dose daily prednisone   DVT prophylaxis  - SCD's  Code Status: DNR/DNI Family Communication: plan of care discussed with the patient  IV access:  Peripheral IV  IAnti-Infectives:   Azithromycin    Julie Richardson, Julie Rarick, MD  [facility] Pager [phone]   If 7PM-7AM, please contact night-coverage [email or web address] Password [email or web address] 12/09/2014, 1:22 PM   LOS: 11 days    HPI/Subjective: No acute overnight events. Patient reports feeling better.  Objective: Filed Vitals:   12/08/14 2023 12/08/14 2130 12/09/14 0601 12/09/14 0827  BP:  147/61 147/72 125/58  Pulse:  73 72 75  Temp:  97.7 F (36.5 C) 98.9 F (37.2 C) 98 F (36.7 C)  TempSrc:  Oral Oral Oral  Resp:  18 18 19   Height:      Weight:   46.5 kg (102 lb 8.2 oz)   SpO2: 95% 95% 99% 95%    Intake/Output Summary (Last 24 hours) at 12/09/14 1322 Last data filed at 12/09/14 1000  Gross per 24 hour  Intake 1915.23 ml  Output      0 ml  Net 1915.23 ml    Exam:   General:  Pt is alert, not in acute distress  Cardiovascular: Rate controlled, S1/S2 (+)  Respiratory: No wheezing, no crackles, no rhonchi  Abdomen: Soft, non tender, non distended, bowel sounds present  Extremities: pulses DP and PT palpable bilaterally  Neuro: Grossly nonfocal  Data Reviewed: Basic Metabolic Panel:  Recent Labs Lab 12/04/14 0517 12/05/14 0523 12/06/14 0630 12/07/14 0542  12/08/14 0430 12/09/14 0415  NA 135  --  138 138 140 142  K 3.6  --  4.2 4.2 3.0* 3.0*  CL 101  --  105 105 106 102  CO2 28  --  GLUCOSE 116*  --  145* 222* 147* 124*  BUN <5*  --  36* 32* 26* 27*  CREATININE 0.58 0.69 0.78 0.69 0.79 0.66  CALCIUM 8.0*  --  8.7* 8.3* 8.5* 8.5*  MG 2.4  --  2.0 1.9  --  1.8   Liver Function Tests:  Recent Labs Lab 12/03/14 0806  AST 33  ALT 17  ALKPHOS 48  BILITOT 0.9  PROT 6.2*  ALBUMIN 2.7*    Recent Labs Lab 12/03/14 0806   LIPASE 33   No results for input(s): AMMONIA in the last 168 hours. CBC:  Recent Labs Lab 12/06/14 2140 12/07/14 0022 12/07/14 0729 12/08/14 0430 12/09/14 0415  WBC 6.1 5.6 6.7 11.6* 9.5  HGB 10.2* 10.1* 10.1* 10.2* 10.4*  HCT 31.4* 31.1* 31.5* 31.4* 32.8*  MCV 90.0 89.9 90.5 90.5 93.4  PLT 173 185 186 211 240   Cardiac Enzymes: No results for input(s): CKTOTAL, CKMB, CKMBINDEX, TROPONINI in the last 168 hours. BNP: Invalid input(s): POCBNP CBG:  Recent Labs Lab 12/08/14 2003 12/08/14 2358 12/09/14 0415 12/09/14 0734 12/09/14 1144  GLUCAP 131* 120* 122* 85 111*    Recent Results (from the past 240 hour(s))  MRSA PCR Screening     Status: Abnormal   Collection Time: 12/06/14  6:40 PM  Result Value Ref Range Status   MRSA by PCR POSITIVE (A) NEGATIVE Final    Comment:        The GeneXpert MRSA Assay (FDA approved for NASAL specimens only), is one component of a comprehensive MRSA colonization surveillance program. It is not intended to diagnose MRSA infection nor to guide or monitor treatment for MRSA infections. RESULT CALLED TO, READ BACK BY AND VERIFIED WITH: HARRSTON,M/2W  ON 12/06/14 BY KARCZEWSKI,S.      Scheduled Meds: . amiodarone  200 mg Oral Daily  . antiseptic oral rinse  15 mL Mouth Rinse TID  . ascorbic acid  500 mg Oral BID  . atorvastatin  20 mg Oral QHS  . azithromycin  250 mg Oral Daily  . bismuth subsalicylate  30 mL Oral BID  . Chlorhexidine Gluconate Cloth  6 each Topical Q0600  . cholecalciferol  2,000 Units Oral Daily  . cycloSPORINE  1 drop Both Eyes BID  . diltiazem  10 mg Intravenous Once  . erythromycin  1 application Both Eyes QHS  . feeding supplement (ENSURE ENLIVE)  237 mL Per Tube QID  . ferrous sulfate  300 mg Oral TID WC  . free water  100 mL Per Tube 3 times per day  . insulin aspart  0-9 Units Subcutaneous 6 times per day  . ipratropium-albuterol  3 mL Nebulization BID  . lip balm  1 application Topical BID   . loperamide  2-4 mg Oral Once  . mupirocin ointment  1 application Nasal BID  . pantoprazole sodium  80 mg Per Tube Daily  . pneumococcal 23 valent vaccine  0.5 mL Intramuscular Tomorrow-1000  . [START ON 12/13/2014] predniSONE  5 mg Oral Q breakfast  . RESOURCE THICKENUP CLEAR   Oral TID WC  . saccharomyces boulardii  250 mg Oral BID  . sodium chloride  3 mL Intravenous Q12H  . sodium chloride  3 mL Intravenous  Q12H  . sodium chloride  3 mL Intravenous Q12H   Continuous Infusions:

## 2014-12-09 NOTE — Progress Notes (Signed)
Speech Language Pathology Treatment: Dysphagia  Patient Details Name: Julie Richardson MRN: [id number] DOB: 05/26/1925 Today's Date: 12/09/2014 Time: [id number] SLP Time Calculation (min) (ACUTE ONLY): 28 min  Assessment / Plan / Recommendation Clinical Impression  Pt sitting upright in chair upon SLP entrance to room.  Voice is clear and pt reports she thinks her swallowing ability is "a little better".  Pt's swallowing exercises sitting in her lap and RN/pt report her completing them today!  Pt demonstrated exercises with min cues for effortful swallow, lingual press and high pitched /I/ conducted independently.  Encouraged pt to consume ice chips freely as tolerated reviewing clinical reasoning.  Pt reports ice needed to help with secretions in pharynx independently, benefited from mod cues to ice consumption for hygiene and to decreased disuse muscle atrophy.  Encouraged pt to continue to expectorate secretions if voice is wet or gurgly.   Pt making great progress with completion of exercises to maximize swallow rehab. Encouraged her to commit for 12-16 weeks before she may see functional improvement.     Please order follow up SLP at SNF.  Recommend pt have MBS in future to determine readiness for po diet consumption *even if for pleasure* due to sensorimotor deficits and proximal esophageal deficits.  Pt agrees.     HPI Other Pertinent Information: Pt is s/p XI robot assisted SIGMOID COLECTOMY, REPAIR OF COLOVAGINAL FISTULA,RIGID PROCTOSCOPY.   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 (overnight in 08/2013).  Pt reports using nectar thick liquids over the last year or so.  Swallow eval ordered as pt reports difficulty swallowing medicine - even when crushed.     Pertinent Vitals Pain Assessment: No/denies pain  SLP Plan  Continue with current plan of care    Recommendations Diet recommendations: NPO (ICE CHIPS)              Follow up Recommendations: Skilled Nursing facility Plan: Continue with current plan of care    GO     Donavan Burnetamara Heidy Mccubbin, MS [facility] SLP [phone]

## 2014-12-09 NOTE — Progress Notes (Signed)
Nutrition Follow-up  DOCUMENTATION CODES:   Non-severe (moderate) malnutrition in context of chronic illness  INTERVENTION:  - Continue Ensure Enlive QID via PEG; this provides 1400 kcal, 80 grams protein. This exceeds protein needs but may be beneficial during hospitalization. When pt d/c'ed recommend switch to Ensure Plus QID which will provide 1400 kcal 52 grams protein  - RD will continue to monitor for needs  NUTRITION DIAGNOSIS:   Inadequate oral intake related to inability to eat as evidenced by NPO status. -ongoing  GOAL:   Patient will meet greater than or equal to 90% of their needs -met with Ensure via PEG  MONITOR:   TF tolerance, Weight trends, Labs, Skin, I & O's  ASSESSMENT:   Pt with colovaginal fistula s/p robotic colectomy/repair 11/28/2014. She had swallowing difficulty x1-3 years PTA but after sugery 11/28/14 was unable to swallow. She subsequently agreed to PEG placement which was placed by IR 12/04/14.  10/18 Pt has been receiving Ensure Enlive QID via PEG since yesterday (10/17). Pt denies abdominal pain or nausea at this time or with Ensure administration; she states last bottle given 20 minutes before RD visit.   Case Manager approached RD earlier this AM with concern about no TF formula ordered. RD had ordered TF 10/14 but order for Jevity 1.2 @ 45 mL/hr no longer in place. Reviewed chart and found notes that indicated: MD note 10/15 stated pt tolerating TF well, Surgery note 10/16 stated TF at goal (Jevity 1.2 @ 45 mL/hr), and Surgery note yesterday at [facility] stated: cycle TF to bolus cans 4-6 / day.  Son and pt report pt comfortable with receiving Ensure via PEG as she was previously drinking this supplement PO. TF formula would be preferential but Ensure is able to be used for nutrition needs. Would recommend assessing for micronutrient deficiencies outpatient to determine if changes need to be made at that time.  Meeting kcal and exceeding protein needs  with current regimen. Per SLP, pt able to consume ice chips and teaspoons of liquid. Medications reviewed. Labs reviewed; CBGs: 85-201 mg/dL, K: 3 mmol/L, BUN elevated   10/14 - Pt seen for RD to manage TF.  - Current weight inaccurate and weight from yesterday (41.5 kg) was used to calculate needs.  - Pt has been unable to consume PO x7 days due to swallowing difficulty.  - Current order in place for Jevity 1.2 @ 50 mL/hr which will provide 1440 kcal, 67 grams protein, and 968 mL free water which will exceed needs. - Will order Jevity 1.2 @ 45 mL/hr with 100 mL free water Q8h which will provide 1296 kcal, 60 grams protein, and 1171 mL free water. Initiate Jevity 1.2 @ 10 mL/hr and increase by 10 mL Q8h to reach goal rate of 45 mL/hr.  - Pt's son at bedside reports that pt's weight has been stable ~96 lbs for "a long time." Weight 12/03/14 was 97 lbs and on 12/04/14 was 91 lbs. -  Mild muscle and fat wasting noted to upper and lower body. Son also expresses concern for aspiration as pt has had several instances of aspiration PNA in the past.    Diet Order:  Diet - low sodium heart healthy Diet NPO time specified Except for: Ice Chips  Skin:  Wound (see comment) (Incisions to rectum and abdomen)  Last BM:  10/17  Height:   Ht Readings from Last 1 Encounters:  12/06/14 4' 11"  (1.499 m)    Weight:   Wt Readings from Last  1 Encounters:  12/09/14 102 lb 8.2 oz (46.5 kg)    Ideal Body Weight:  44.09 kg (kg)  BMI:  Body mass index is 20.69 kg/(m^2).  Estimated Nutritional Needs:   Kcal:  4599-7741  Protein:  47-60 grams  Fluid:  1.8-2 L/day  EDUCATION NEEDS:   No education needs identified at this time     Jarome Matin, RD, LDN Inpatient Clinical Dietitian Pager # [phone] After hours/weekend pager # [phone]

## 2014-12-09 NOTE — Progress Notes (Signed)
[facility]  [address] [place], [place] [address] Phone: [phone] FAX: [phone]   Julie Richardson [id number] 11-29-1925   Problem List:   Principal Problem:   Colovaginal fistula s/p robotic colectomy/repair 11/28/2014 Active Problems:   COPD (chronic obstructive pulmonary disease) (HCC)   GERD (gastroesophageal reflux disease)   Bronchiectasis with Pseudomonas without acute exacerbation (HCC)   Chronic antibiotic suppression - Azithromycin for chronic bronchiectasis with Pseudomonas   Hypokalemia   Hypomagnesemia   Protein-calorie malnutrition, moderate (HCC)   Dysphagia   Oropharyngeal dysphagia   11 Days Post-Op  11/28/2014  Procedure(s): XI ROBOT ASSISTED SIGMOID COLECTOMY, REPAIR OF COLOVAGINAL FISTULA,RIGID PROCTOSCOPY  Assessment  Severe dysphagia near UES of uncertain etiology  Anemia s/p transfusion - better  Diarrhea - most likely from TFs - improved  Plan:  -severe dysphagia by history & fluoroscopy  - d/w SLP & ENT (Dr Constance Holster).  Barium swallow confirms this.    -ENT eval confirms.  Agree w Bevely Palmer.   D/w pt & her son.    Pt needs gastric feeding tube to help reliably get nutrition & meds enterally.  I suspect dysphagia will be a permanent problem & will not fully recover - but could improve w ENT/SLP help.  Pt has friend w G tube that is active/functional, and she & her son are very open to the idea of a G tube.  Patient is still somewhat active & independent so they remain reasonably aggressive.    She dislikes the idea of an NGT or ND feeding tube.  I agree that nasal tubes are a poor long term solution.    I called [facility] Wed AM to do EGD to evaluate esophagus & stomach & then do PEG by EGD as well.   Dr Paulita Fujita to d/w Dr Penelope Coop.  Dr Cristina Gong eventually d/w the next day & they declined PEG placement  [facility]Rad Dr Pascal Lux able to place 10/13  Meds & TFs being started via Gtube    TFs at goal - tol bolus  feeds  -ileus resolved from colectomy/colovaginal fistula repair  -cycle TF to bolus cans 4 / day.  See if nutrition agrees  -pathology benign - d/w pt/son & copy of report in the room  -hypoK - replace.  Old nurses to use the G-tube.  They chose the IV when necessary order.  I canceled it.  -hypoMag - replaced  -changed narcotics w bad dreams & mild grogginess - MS better  -Afib & inc HR controlled w IV amiodarone - not switch to enteral form yet.  I started.  I asked cardiology to help take care of that.  Meds per cardiology & medicine  -HTN control - enteral control  -chronic brochiectasis - switch back to enteral Azithromycin with h/o Pseudomonas bronchiectasis with WBC increasing   -anemia stable  -prednisone at baseline.  i doubt patient had addisonian crisis.  -VTE prophylaxis- SCDs, etc  -mobilize as tolerated to help recovery  D/C patient from hospital when patient meets criteria (anticipate tomorrow):  Tolerating TFs boluses Ambulating well Adequate pain control without IV medications Urinating  Having flatus Disposition planning in place - plan to go to skilled nursing section of assisted living   I updated the patient's status to the patient & her RN.  Recommendations were made.  Questions were answered.  The patient & RN expressed understanding & appreciation.   Adin Hector, M.D., F.A.C.S. Gastrointestinal and Minimally Invasive Surgery [facility] [phone]  [address], [address] [place], [place] [address] [phone] Main / Paging   12/09/2014  Subjective:  Mildly groggy from Gtube placement yest Hungry RN just outside door  Objective:  Vital signs:  Filed Vitals:   12/08/14 2023 12/08/14 2130 12/09/14 0601 12/09/14 0827  BP:  147/61 147/72 125/58  Pulse:  73 72 75  Temp:  97.7 F (36.5 C) 98.9 F (37.2 C) 98 F (36.7 C)  TempSrc:  Oral Oral Oral  Resp:  _0 Height:      Weight:   46.5 kg (102 lb 8.2  oz)   SpO2: 95% 95% 99% 95%    Last BM Date: 12/08/14  Intake/Output   Yesterday:  10/17 0701 - 10/18 0700 In: 1648.2 [I.V.:881.2; NG/GT:707] Out: -  This shift:  Total I/O In: 537 [NG/GT:437; IV Piggyback:100] Out: -   Bowel function:  Flatus: y  BM: y  Drain: n/a  Physical Exam:  General: Pt awake/alert/oriented x4 in no acute distress.  Mildly sleepy but wakes up well Eyes: PERRL, normal EOM.  Sclera clear.  No icterus Neuro: CN II-XII intact w/o focal sensory/motor deficits. Lymph: No head/neck/groin lymphadenopathy Psych:  No delerium/psychosis/paranoia HENT: Normocephalic, Mucus membranes dry.  No thrush.  Remains HOH Neck: Supple, No tracheal deviation Chest: No chest wall pain w good excursion CV:  Pulses intact.  Regular rhythm MS: Normal AROM mjr joints.  No obvious deformity Abdomen: Soft.  Nondistended.  Nontender at incisions.  No evidence of peritonitis.  No incarcerated hernias. Ext:  SCDs BLE.  No mjr edema.  No cyanosis Skin: No petechiae / purpura  Results:   Labs: Results for orders placed or performed during the hospital encounter of 11/28/14 (from the past 48 hour(s))  Glucose, capillary     Status: Abnormal   Collection Time: 12/07/14 12:51 PM  Result Value Ref Range   Glucose-Capillary 183 (H) 65 - 99 mg/dL   Comment 1 Notify RN    Comment 2 Document in Chart   Glucose, capillary     Status: Abnormal   Collection Time: 12/07/14  4:39 PM  Result Value Ref Range   Glucose-Capillary 201 (H) 65 - 99 mg/dL  Glucose, capillary     Status: Abnormal   Collection Time: 12/07/14  8:43 PM  Result Value Ref Range   Glucose-Capillary 191 (H) 65 - 99 mg/dL  Glucose, capillary     Status: Abnormal   Collection Time: 12/07/14 11:29 PM  Result Value Ref Range   Glucose-Capillary 146 (H) 65 - 99 mg/dL  CBC     Status: Abnormal   Collection Time: 12/08/14  4:30 AM  Result Value Ref Range   WBC 11.6 (H) 4.0 - 10.5 K/uL   RBC 3.47 (L) 3.87 - 5.11  MIL/uL   Hemoglobin 10.2 (L) 12.0 - 15.0 g/dL   HCT 31.4 (L) 36.0 - 46.0 %   MCV 90.5 78.0 - 100.0 fL   MCH 29.4 26.0 - 34.0 pg   MCHC 32.5 30.0 - 36.0 g/dL   RDW 16.2 (H) 11.5 - 15.5 %   Platelets 211 150 - 400 K/uL  Basic metabolic panel     Status: Abnormal   Collection Time: 12/08/14  4:30 AM  Result Value Ref Range   Sodium 140 135 - 145 mmol/L   Potassium 3.0 (L) 3.5 - 5.1 mmol/L    Comment: DELTA CHECK NOTED REPEATED TO VERIFY    Chloride 106 101 - 111 mmol/L   CO2 27  22 - 32 mmol/L   Glucose, Bld 147 (H) 65 - 99 mg/dL   BUN 26 (H) 6 - 20 mg/dL   Creatinine, Ser 0.79 0.44 - 1.00 mg/dL   Calcium 8.5 (L) 8.9 - 10.3 mg/dL   GFR calc non Af Amer >60 >60 mL/min   GFR calc Af Amer >60 >60 mL/min    Comment: (NOTE) The eGFR has been calculated using the CKD EPI equation. This calculation has not been validated in all clinical situations. eGFR's persistently <60 mL/min signify possible Chronic Kidney Disease.    Anion gap 7 5 - 15  Glucose, capillary     Status: Abnormal   Collection Time: 12/08/14  4:36 AM  Result Value Ref Range   Glucose-Capillary 140 (H) 65 - 99 mg/dL  Glucose, capillary     Status: Abnormal   Collection Time: 12/08/14  7:55 AM  Result Value Ref Range   Glucose-Capillary 123 (H) 65 - 99 mg/dL   Comment 1 Notify RN    Comment 2 Document in Chart   Glucose, capillary     Status: Abnormal   Collection Time: 12/08/14 12:00 PM  Result Value Ref Range   Glucose-Capillary 135 (H) 65 - 99 mg/dL   Comment 1 Notify RN    Comment 2 Document in Chart   Glucose, capillary     Status: Abnormal   Collection Time: 12/08/14  4:18 PM  Result Value Ref Range   Glucose-Capillary 114 (H) 65 - 99 mg/dL   Comment 1 Notify RN    Comment 2 Document in Chart   Glucose, capillary     Status: Abnormal   Collection Time: 12/08/14  8:03 PM  Result Value Ref Range   Glucose-Capillary 131 (H) 65 - 99 mg/dL  Glucose, capillary     Status: Abnormal   Collection Time:  12/08/14 11:58 PM  Result Value Ref Range   Glucose-Capillary 120 (H) 65 - 99 mg/dL  CBC     Status: Abnormal   Collection Time: 12/09/14  4:15 AM  Result Value Ref Range   WBC 9.5 4.0 - 10.5 K/uL   RBC 3.51 (L) 3.87 - 5.11 MIL/uL   Hemoglobin 10.4 (L) 12.0 - 15.0 g/dL   HCT 32.8 (L) 36.0 - 46.0 %   MCV 93.4 78.0 - 100.0 fL   MCH 29.6 26.0 - 34.0 pg   MCHC 31.7 30.0 - 36.0 g/dL   RDW 16.7 (H) 11.5 - 15.5 %   Platelets 240 150 - 400 K/uL  Basic metabolic panel     Status: Abnormal   Collection Time: 12/09/14  4:15 AM  Result Value Ref Range   Sodium 142 135 - 145 mmol/L   Potassium 3.0 (L) 3.5 - 5.1 mmol/L   Chloride 102 101 - 111 mmol/L   CO2 31 22 - 32 mmol/L   Glucose, Bld 124 (H) 65 - 99 mg/dL   BUN 27 (H) 6 - 20 mg/dL   Creatinine, Ser 0.66 0.44 - 1.00 mg/dL   Calcium 8.5 (L) 8.9 - 10.3 mg/dL   GFR calc non Af Amer >60 >60 mL/min   GFR calc Af Amer >60 >60 mL/min    Comment: (NOTE) The eGFR has been calculated using the CKD EPI equation. This calculation has not been validated in all clinical situations. eGFR's persistently <60 mL/min signify possible Chronic Kidney Disease.    Anion gap 9 5 - 15  Glucose, capillary     Status: Abnormal   Collection Time: 12/09/14  4:15 AM  Result Value Ref Range   Glucose-Capillary 122 (H) 65 - 99 mg/dL  Magnesium     Status: None   Collection Time: 12/09/14  4:15 AM  Result Value Ref Range   Magnesium 1.8 1.7 - 2.4 mg/dL  Glucose, capillary     Status: None   Collection Time: 12/09/14  7:34 AM  Result Value Ref Range   Glucose-Capillary 85 65 - 99 mg/dL   Comment 1 Notify RN    Comment 2 Document in Chart   Glucose, capillary     Status: Abnormal   Collection Time: 12/09/14 11:44 AM  Result Value Ref Range   Glucose-Capillary 111 (H) 65 - 99 mg/dL   Comment 1 Notify RN    Comment 2 Document in Chart     Imaging / Studies: No results found.  Medications / Allergies: per chart  Antibiotics: Anti-infectives    Start      Dose/Rate Route Frequency Ordered Stop   12/05/14 1000  azithromycin (ZITHROMAX) tablet 250 mg     250 mg Oral Daily 12/05/14 0746     12/01/14 1500  cefTAZidime (FORTAZ) 1 g in dextrose 5 % 50 mL IVPB  Status:  Discontinued    Comments:  When taking PO well, switch to home azithromycin 247m PO daily   1 g 100 mL/hr over 30 Minutes Intravenous Every 12 hours 12/01/14 1332 12/05/14 0745   11/28/14 2300  cefoTEtan (CEFOTAN) 2 g in dextrose 5 % 50 mL IVPB     2 g 100 mL/hr over 30 Minutes Intravenous Every 12 hours 11/28/14 1736 11/28/14 2330   11/28/14 1532  clindamycin (CLEOCIN) 900 mg, gentamicin (GARAMYCIN) 240 mg in sodium chloride 0.9 % 1,000 mL for intraperitoneal lavage  Status:  Discontinued       As needed 11/28/14 1532 11/28/14 1535   11/28/14 1006  cefoTEtan (CEFOTAN) 2 g in dextrose 5 % 50 mL IVPB     2 g 100 mL/hr over 30 Minutes Intravenous On call to O.R. 11/28/14 1006 11/28/14 1305   11/28/14 0600  clindamycin (CLEOCIN) 900 mg, gentamicin (GARAMYCIN) 240 mg in sodium chloride 0.9 % 1,000 mL for intraperitoneal lavage  Status:  Discontinued    Comments:  Pharmacy may adjust dosing strength, schedule, rate of infusion, etc as needed to optimize therapy    Intraperitoneal To Surgery 11/27/14 1323 11/28/14 1643        Note: Portions of this report may have been transcribed using voice recognition software. Every effort was made to ensure accuracy; however, inadvertent computerized transcription errors may be present.   Any transcriptional errors that result from this process are unintentional.     SAdin Hector M.D., F.A.C.S. Gastrointestinal and Minimally Invasive Surgery [facility] [address] [place] [phone] / Paging   12/09/2014  CARE TEAM:  PCP: PWillodean Rosenthal MD  Outpatient Care Team: Patient Care Team: JWillodean Rosenthal MD as PCP - General (Internal Medicine) SBjorn Loser MD as  Consulting Physician (Urology) SMichael Boston MD as Consulting Physician (General Surgery) JTeena Irani MD as Consulting Physician (Gastroenterology)  Inpatient Treatment Team: Treatment Team: Attending Provider: SMichael Boston MD; Registered Nurse: LBailey Mech RN; Rounding Team: WGarner Gavel MD; Consulting Physician: SWonda Horner MD; Technician: RMilagros Reap NT; Consulting Physician: WArta Silence MD; Consulting Physician: RMichae KavaLbcardiology, MD; Registered Nurse: ETacey Ruiz RN; Technician: CMertha Baars NT; Registered Nurse: KPatria Mane RN; Technician: DTilda Burrow  Althea Charon, NT; Social Worker: Deirdre Peer, [place]; Student Nurse: Kallie Edward, Student-RN; Registered Nurse: Roe Rutherford, RN; Registered Nurse: Elvera Lennox; Physical Therapist: Diego Cory, PTA

## 2014-12-09 NOTE — Progress Notes (Signed)
NUTRITION NOTE  New consult received stating: Recommendations for quantity and type of enteral supplement for G-tube bolus tube feeds for the rest of her life.   Recommend: Jevity 1.5 QID which will provide 1422 kcal, 60 grams protein, and 720 mL free water. Recommend 50 mL free water before and after each bolus to provide additional 400 mL free water/day.     Trenton GammonJessica Anberlyn Feimster, RD, LDN Inpatient Clinical Dietitian Pager # [phone] After hours/weekend pager # [phone]

## 2014-12-09 NOTE — Progress Notes (Signed)
[facility]. They are agreeable to plan and aware of her feedings (Bolus).  CSW will follow up tomorrow to further assist with dc.   Reece LevyJanet Susannah Carbin, MSW, Theresia MajorsLCSWA  [phone]

## 2014-12-10 LAB — GLUCOSE, CAPILLARY
GLUCOSE-CAPILLARY: 161 mg/dL — AB (ref 65–99)
GLUCOSE-CAPILLARY: 87 mg/dL (ref 65–99)
GLUCOSE-CAPILLARY: 89 mg/dL (ref 65–99)

## 2014-12-10 MED ORDER — LOPERAMIDE HCL 2 MG PO CAPS: 2.0000 mg | ORAL_CAPSULE | ORAL | Status: AC | PRN

## 2014-12-10 MED ORDER — JEVITY 1.2 CAL PO LIQD: 237.0000 mL | Freq: Four times a day (QID) | ORAL | Status: AC

## 2014-12-10 MED ORDER — AMIODARONE HCL 200 MG PO TABS: 200.0000 mg | ORAL_TABLET | Freq: Every day | ORAL | Status: AC

## 2014-12-10 MED ORDER — POTASSIUM CHLORIDE CRYS ER 20 MEQ PO TBCR
40.0000 meq | EXTENDED_RELEASE_TABLET | Freq: Once | ORAL | Status: AC
  Administered 2014-12-10: 40 meq via ORAL
  Filled 2014-12-10: qty 2

## 2014-12-10 NOTE — Care Management Note (Signed)
Case Management Note  Patient Details  Name: Julie Richardson MRN: [id number] Date of Birth: 09/25/1925  Subjective/Objective:                    Action/Plan:d/c SNF.   Expected Discharge Date:                  Expected Discharge Plan:  Skilled Nursing Facility  In-House Referral:  Clinical Social Work  Discharge planning Services  CM Consult  Post Acute Care Choice:    Choice offered to:     DME Arranged:    DME Agency:     HH Arranged:    HH Agency:     Status of Service:  Completed, signed off  Medicare Important Message Given:  Yes-fourth notification given Date Medicare IM Given:    Medicare IM give by:    Date Additional Medicare IM Given:    Additional Medicare Important Message give by:     If discussed at Long Length of Stay Meetings, dates discussed:    Additional Comments:  Lanier ClamMahabir, Deavion Dobbs, RN 12/10/2014, 11:36 AM

## 2014-12-10 NOTE — Progress Notes (Signed)
Patient discharged back to SNF, transported to the facility by son, patient D/C patient prepared by CSW and given to son for the facility. No pressure ulcer noted but MASD reported off to PACCAR IncLisa Love-LPN at the facility. Patient denies any pain/distress. Transported to the car by staff via Wheelchair.

## 2014-12-10 NOTE — Progress Notes (Signed)
[facility] Son and patient agreeable to this plan- plan transfer by son's car.  Reece LevyJanet Eleana Tocco, MSW, Theresia MajorsLCSWA [phone]

## 2014-12-16 ENCOUNTER — Other Ambulatory Visit: Payer: Self-pay | Admitting: Surgery

## 2015-01-02 ENCOUNTER — Other Ambulatory Visit (HOSPITAL_COMMUNITY): Payer: Self-pay | Admitting: Internal Medicine

## 2015-01-02 DIAGNOSIS — R1310 Dysphagia, unspecified: Secondary | ICD-10-CM

## 2015-01-07 ENCOUNTER — Ambulatory Visit (HOSPITAL_COMMUNITY)
Admission: RE | Admit: 2015-01-07 | Discharge: 2015-01-07 | Disposition: A | Discharge status: Home / Self Care | Payer: Medicare Other | Source: Ambulatory Visit | Attending: Internal Medicine | Admitting: Internal Medicine

## 2015-01-07 DIAGNOSIS — E119 Type 2 diabetes mellitus without complications: Secondary | ICD-10-CM | POA: Diagnosis not present

## 2015-01-07 DIAGNOSIS — E785 Hyperlipidemia, unspecified: Secondary | ICD-10-CM | POA: Diagnosis not present

## 2015-01-07 DIAGNOSIS — R1314 Dysphagia, pharyngoesophageal phase: Secondary | ICD-10-CM | POA: Diagnosis not present

## 2015-01-07 DIAGNOSIS — Z8673 Personal history of transient ischemic attack (TIA), and cerebral infarction without residual deficits: Secondary | ICD-10-CM | POA: Diagnosis not present

## 2015-01-07 DIAGNOSIS — R1310 Dysphagia, unspecified: Secondary | ICD-10-CM | POA: Diagnosis present

## 2015-01-07 NOTE — Progress Notes (Signed)
Speech Pathology   MBSS complete. Full report located under chart review in imaging section. Double click on DG swallow function. Recommend: continue NPO, intermittent ice chips following oral care.Please see full report for details.    Breck CoonsLisa Willis MilesLitaker M.Ed [employer]CCC-SLP Pager [phone]

## 2015-02-02 ENCOUNTER — Ambulatory Visit: Payer: Medicare Other

## 2015-02-04 ENCOUNTER — Ambulatory Visit: Payer: Medicare Other | Attending: Family Medicine

## 2015-02-04 DIAGNOSIS — R1312 Dysphagia, oropharyngeal phase: Secondary | ICD-10-CM | POA: Insufficient documentation

## 2015-02-05 NOTE — Therapy (Signed)
[facility] [address] [place], [place], [address] Phone: [phone]   Fax:  [phone]  Speech Language Pathology Evaluation  Patient Details  Name: Julie OkaBettie Stonebraker MRN: [id number] Date of Birth: 11/17/1925 Referring Provider: Raelyn MoraQuilty, Eileen, NP-C  Encounter Date: 02/04/2015      End of Session - 02/04/15 1643    Visit Number 1   Number of Visits 25   Date for SLP Re-Evaluation 04/06/15   SLP Start Time 1021   SLP Stop Time  1101   SLP Time Calculation (min) 40 min   Activity Tolerance Patient tolerated treatment well      Past Medical History  Diagnosis Date  . HOH (hard of hearing)   . Anemia   . Diabetes mellitus without complication (HCC)   . History of home oxygen therapy     uses 2 liters at hs and prn during day  . Atrial fibrillation (HCC)     hx of  . Spinal stenosis   . GERD (gastroesophageal reflux disease)   . Chronic kidney disease     stage 3  . Arthritis   . Dysphagia     uses thickened liquids  . Hyperlipidemia   . Pelvis fracture (HCC) 6 years ago  . Complication of anesthesia     slow to wake up  . Blind left eye   . HOH (hard of hearing)   . TIA (transient ischemic attack)     7 YEARS AGO, NONE SINCE  . Pneumonia oct 2015    ASPIRATION PNEUMONIA X 3- 4 TIMES IN PAST  . COPD (chronic obstructive pulmonary disease) (HCC)     pseodonomonus bronchiectasis    Past Surgical History  Procedure Laterality Date  . Abdominal hysterectomy    . Right shoulder surgery  years ago    for fracture  . Tonsillectomy  as child    There were no vitals filed for this visit.  Visit Diagnosis: Oropharyngeal dysphagia      Subjective Assessment - 02/04/15 1034    Subjective Pt arrives to receive ST for dysphagia using NMES along with HEP and PRN POs.   Patient is accompained by: Family member  Aurther Lofterry, son            [facility] - 02/04/15 1035    SLP Visit Information    SLP Received On 02/04/15   Referring Provider Raelyn MoraQuilty, Eileen, NP-C   Medical Diagnosis S/P CVA/TIA   Subjective   Patient/Family Stated Goal Pt reports she would like to have POs safely again.   General Information   HPI Pt with hx or CVA/TIA approx 6-7 years ago resulting in intermittent aspiration PNA, on ground meats nectar thick for last two years, PNA since that time Oct 2015. Since sigmoid colectomy repair of colovaginal fistula 11-28-14 pt unable to swallow. MBSS on 12-01-14 rec NPO. Repeat 01-07-15 rec cont NPO but slight improvement. Pt has rec'd traditional dysphagia tx at her ALF since mid-late OCtober 2016. Electrical stimulation attempted at this time to improve pt's swallow to a greater extent.    Prior Functional Status   Cognitive/Linguistic Baseline Baseline deficits  Pt looking to son for medical history information   Baseline deficit details Memory deficits   Type of Home Assisted living   Vocation Retired   MicrobiologistCognition   Memory Impaired   Memory Impairment Decreased long term memory  pt asking son for medical history info   Auditory Comprehension   Overall Auditory Comprehension Appears  within functional limits for tasks assessed   Verbal Expression   Overall Verbal Expression Appears within functional limits for tasks assessed   Oral Motor/Sensory Function   Overall Oral Motor/Sensory Function Impaired  Likely impaired at baseline   Labial ROM Reduced left   Labial Symmetry Abnormal symmetry left  slight   Labial Strength Reduced  moreso on lt than rt   Labial Coordination Reduced  slight on alternating from labial margin to margin   Lingual ROM Reduced left;Reduced right  slight   Lingual Symmetry Within Functional Limits   Lingual Strength Reduced Left   Lingual Coordination Reduced   Facial ROM Within Functional Limits   Facial Symmetry Left drooping eyelid  possibly d/t prednisone for lttemporal(giant nerve)arthritis   Velum Within Functional Limits   appearing like reduced gag   Overall Oral Motor/Sensory Function Pt with limited thyroid elevation upon spontaneous swalllow. Mulitple attempts (x3) made at second swallow prior to initiation of oral stage- weak tongue pumping was noted.   Motor Speech   Overall Motor Speech Appears within functional limits for tasks assessed                         SLP Education - 02/04/15 1212    Education provided Yes   Education Details therapy course, NMES, need for oral care <30 minutes before appointment, 4 week assessment may lead to d/c and suggest referral to [id number]) Educated Patient;Child(ren)   Methods Explanation;Demonstration   Comprehension Verbalized understanding          SLP Short Term Goals - 02/04/15 1218    SLP SHORT TERM GOAL #1   Title pt will demo thyroid elevation on 18/20 swallows   Time 4   Period Weeks   Status New   SLP SHORT TERM GOAL #2   Title pt will demo effortful swallow/pronounced thyroid elevation with PO ice chips and without resulting hydrophonic voice in 30 swallows 50% success   Time 4   Period Weeks   Status New   SLP SHORT TERM GOAL #3   Title pt will complete 100 swallows a session during 5 sessions   Time 4   Period Weeks   Status New   SLP SHORT TERM GOAL #4   Title pt will demo her HEP with rare min A   Time 4   Period Weeks   Status New          SLP Long Term Goals - 02/04/15 1223    SLP LONG TERM GOAL #1   Title pt will demo her HEP with modified independence   Time 8   Period Weeks   Status New   SLP LONG TERM GOAL #2   Title pt will tell SLP 5 signs of aspiration PNA with rare min A   Time 8   Period Weeks   Status New   SLP LONG TERM GOAL #3   Title pt will demo effortful swallow/pronounced thyroid elevation with PO ice chips and without resulting hydrophonic voice in 50 swallows 75% success   Time 8   Period Weeks   Status New          Plan - 02/04/15 1215    Clinical Impression  Statement Pt presents with oropharyngeal dysphagia and is currently NPO. Pt would like to utilize NMES for swallowing to see if there can be further improvement with her swallowing after traditional therapy. SLP told pt target would be ground meats  and nectar liquids, given her longstading premorbid dysphagia. Skilled ST is needed to work with pt's HEP and ice chips and NMES to maximize swallow ability.   Speech Therapy Frequency 3x / week   Duration --  8 weeks   Treatment/Interventions Aspiration precaution training;Pharyngeal strengthening exercises;Trials of upgraded texture/liquids;Compensatory techniques;NMES   Potential to Achieve Goals Fair   Potential Considerations Severity of impairments;Previous level of function          G-Codes - Feb 08, 2015 1106    Functional Assessment Tool Used MBSS completed 01-07-15   Functional Limitations Swallowing   Swallow Current Status (Z6109) At least 40 percent but less than 60 percent impaired, limited or restricted   Swallow Goal Status (U0454) At least 40 percent but less than 60 percent impaired, limited or restricted      Problem List Patient Active Problem List   Diagnosis Date Noted  . Oropharyngeal dysphagia   . Dysphagia   . Protein-calorie malnutrition, moderate (HCC) 12/03/2014  . Hypokalemia 12/02/2014  . Hypomagnesemia 12/02/2014  . Bronchiectasis with Pseudomonas without acute exacerbation (HCC) 12/01/2014  . Chronic antibiotic suppression - Azithromycin for chronic bronchiectasis with Pseudomonas 12/01/2014  . HOH (hard of hearing)   . COPD (chronic obstructive pulmonary disease) (HCC)   . TIA (transient ischemic attack)   . GERD (gastroesophageal reflux disease)   . Slurring of speech 11/30/2014  . Colovaginal fistula s/p robotic colectomy/repair 11/28/2014 11/28/2014  . Recurrent aspiration pneumonia (HCC) 12/26/2013  . Involutional osteoporosis 05/28/2013  . Lumbosacral spondylosis 05/01/2013  . Cranial arteritis (HCC)  02/05/2013  . Cornea scar 02/05/2013  . 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 Temporal arteritis (HCC) 02/05/2013    SCHINKE,CARL , MS, CCC-SLP 02/05/2015, 12:28 PM  [facility] [address] [place], [place], [address] Phone: [phone]   Fax:  [phone]  Name: Nathania Waldman MRN: [id number] Date of Birth: 07/04/1925

## 2015-02-12 ENCOUNTER — Ambulatory Visit: Payer: Medicare Other

## 2015-02-12 DIAGNOSIS — R1312 Dysphagia, oropharyngeal phase: Secondary | ICD-10-CM

## 2015-02-12 NOTE — Therapy (Signed)
[facility] [address] [place], [place], [address] Phone: [phone]   Fax:  [phone]  Speech Language Pathology Treatment  Patient Details  Name: Julie Richardson MRN: [id number] Date of Birth: [birth year] Referring Provider: Raelyn Mora, NP-C  Encounter Date: 02/12/2015      End of Session - 02/12/15 1635    Visit Number 2   Number of Visits 25   Date for SLP Re-Evaluation 04/06/15   SLP Start Time 1318   SLP Stop Time  1400   SLP Time Calculation (min) 42 min   Activity Tolerance Patient tolerated treatment well      Past Medical History  Diagnosis Date  . HOH (hard of hearing)   . Anemia   . Diabetes mellitus without complication (HCC)   . History of home oxygen therapy     uses 2 liters at hs and prn during day  . Atrial fibrillation (HCC)     hx of  . Spinal stenosis   . GERD (gastroesophageal reflux disease)   . Chronic kidney disease     stage 3  . Arthritis   . Dysphagia     uses thickened liquids  . Hyperlipidemia   . Pelvis fracture (HCC) 6 years ago  . Complication of anesthesia     slow to wake up  . Blind left eye   . HOH (hard of hearing)   . TIA (transient ischemic attack)     7 YEARS AGO, NONE SINCE  . Pneumonia oct 2015    ASPIRATION PNEUMONIA X 3- 4 TIMES IN PAST  . COPD (chronic obstructive pulmonary disease) (HCC)     pseodonomonus bronchiectasis    Past Surgical History  Procedure Laterality Date  . Abdominal hysterectomy    . Right shoulder surgery  years ago    for fracture  . Tonsillectomy  as child    There were no vitals filed for this visit.  Visit Diagnosis: Oropharyngeal dysphagia      Subjective Assessment - 02/12/15 1634    Subjective Pt arrives today with exercises from ALF SLP. Pt reports completing oral care <30 minutes beofre tx today.               ADULT SLP TREATMENT - 02/12/15 1342    General Information   Behavior/Cognition  Alert;Pleasant mood;Cooperative   Treatment Provided   Treatment provided Dysphagia   Dysphagia Treatment   Patient observed directly with PO's Yes   Type of PO's observed Ice chips   Oral Phase Signs & Symptoms --  none   Pharyngeal Phase Signs & Symptoms --  none   Other treatment/comments Pt brought her HEP from the ALF. SLP added towel hold static and dynamic exercises to HEP and  modified number of reps as pt performed her exercises to match fatigue level. SLP told pt to strive to attain the least number of reps for each exercise. SLP used NMES as a treatment modality along with pt's HEP today at setting 8.83mA, with placement 3a (vertical) in order to utilize thyroid lifters. Pt noted to fatigue approx 20 minutes into session today, evidenced by reduced frequency of audible swallow and reduced/negligible thyroid elevation with ice chips.   Dysphagia Recommendations   Diet recommendations NPO;Other(comment)  with ice chips <30 minutes after oral care   Progression Toward Goals   Progression toward goals Progressing toward goals          SLP Education - 02/12/15 1635    Education  provided Yes   Education Details HEP   Person(s) Educated Patient;Child(ren)   Methods Explanation;Demonstration;Verbal cues;Handout   Comprehension Verbalized understanding;Returned demonstration          SLP Short Term Goals - 02/12/15 1636    SLP SHORT TERM GOAL #1   Title pt will demo thyroid elevation on 18/20 swallows   Time 4   Period Weeks   Status On-going   SLP SHORT TERM GOAL #2   Title pt will demo effortful swallow/pronounced thyroid elevation with PO ice chips and without resulting hydrophonic voice in 30 swallows 50% success   Time 4   Period Weeks   Status On-going   SLP SHORT TERM GOAL #3   Title pt will complete 100 swallows a session during 5 sessions   Time 4   Period Weeks   Status On-going   SLP SHORT TERM GOAL #4   Title pt will demo her HEP with rare min A   Time 4    Period Weeks   Status On-going          SLP Long Term Goals - 02/12/15 1637    SLP LONG TERM GOAL #1   Title pt will demo her HEP with modified independence   Time 8   Period Weeks   Status On-going   SLP LONG TERM GOAL #2   Title pt will tell SLP 5 signs of aspiration PNA with rare min A   Time 8   Period Weeks   Status On-going   SLP LONG TERM GOAL #3   Title pt will demo effortful swallow/pronounced thyroid elevation with PO ice chips and without resulting hydrophonic voice in 50 swallows 75% success   Time 8   Period Weeks   Status On-going          Plan - 02/12/15 1635    Clinical Impression Statement Pt performed her HEP with NMES placed with reduced stamina for >15-20 minutes of swallowing practice. Pt will need skilled ST to continue to maximize swallowing ability.   Speech Therapy Frequency 3x / week   Duration --  8 weeks   Treatment/Interventions Aspiration precaution training;Pharyngeal strengthening exercises;Trials of upgraded texture/liquids;Compensatory techniques;NMES   Potential to Achieve Goals Fair   Potential Considerations Severity of impairments;Previous level of function        Problem List Patient Active Problem List   Diagnosis Date Noted  . Oropharyngeal dysphagia   . Dysphagia   . Protein-calorie malnutrition, moderate (HCC) 12/03/2014  . Hypokalemia 12/02/2014  . Hypomagnesemia 12/02/2014  . Bronchiectasis with Pseudomonas without acute exacerbation (HCC) 12/01/2014  . Chronic antibiotic suppression - Azithromycin for chronic bronchiectasis with Pseudomonas 12/01/2014  . HOH (hard of hearing)   . COPD (chronic obstructive pulmonary disease) (HCC)   . TIA (transient ischemic attack)   . GERD (gastroesophageal reflux disease)   . Slurring of speech 11/30/2014  . Colovaginal fistula s/p robotic colectomy/repair 11/28/2014 11/28/2014  . Recurrent aspiration pneumonia (HCC) 12/26/2013  . Involutional osteoporosis 05/28/2013  .  Lumbosacral spondylosis 05/01/2013  . Cranial arteritis (HCC) 02/05/2013  . Cornea scar 02/05/2013  . 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 Temporal arteritis (HCC) 02/05/2013    [facility] Center[facility]IN[facility],Jaquese Irving , MS, CCC-SLP  02/12/2015, 4:37 PM  [facility] [address] [place], [place], [address] Phone: [phone]   Fax:  [phone]   Name: Julie Richardson MRN: [id number] Date of Birth: 04/10/1925

## 2015-02-12 NOTE — Patient Instructions (Signed)
SWALLOW EXERCISES  - do at least twice per day  1)  Press your tongue as hard as you can against the roof of your mouth, Hold 5 seconds (15-20x)  2) Stick tongue out between your teeth then swallow (10-15x)  3) Say "eeeee" as high/squeaky a pitch as you can - hold 5 seconds (15-20x)  4)  With ice chips in your mouth, swallow as hard as you can, contracting neck muscles (5-10x)  5)  Place hand under your chin, open mouth slowly. Using your hand for resistance. Hold 5 seconds (20x)  6) Place a rolled-up towel under your chin and tuck your chin strongly to hold for 45-60 seconds (3x)  7) Keep the rolled up towel there and tuck your chin strongly for 1 second (25-30x)

## 2015-02-13 ENCOUNTER — Ambulatory Visit: Payer: Medicare Other

## 2015-02-13 DIAGNOSIS — R1312 Dysphagia, oropharyngeal phase: Secondary | ICD-10-CM | POA: Diagnosis not present

## 2015-02-13 NOTE — Therapy (Signed)
[facility] [address] [place], [place], [address] Phone: [phone]   Fax:  [phone]  Speech Language Pathology Treatment  Patient Details  Name: Jamison OkaBettie Overfield MRN: [id number] Date of Birth: 10/22/1925 Referring Provider: Raelyn MoraQuilty, Eileen, NP-C  Encounter Date: 02/13/2015      End of Session - 02/13/15 1515    Visit Number 3   Number of Visits 25   Date for SLP Re-Evaluation 04/06/15   SLP Start Time 1103   SLP Stop Time  1145   SLP Time Calculation (min) 42 min   Activity Tolerance Patient tolerated treatment well      Past Medical History  Diagnosis Date  . HOH (hard of hearing)   . Anemia   . Diabetes mellitus without complication (HCC)   . History of home oxygen therapy     uses 2 liters at hs and prn during day  . Atrial fibrillation (HCC)     hx of  . Spinal stenosis   . GERD (gastroesophageal reflux disease)   . Chronic kidney disease     stage 3  . Arthritis   . Dysphagia     uses thickened liquids  . Hyperlipidemia   . Pelvis fracture (HCC) 6 years ago  . Complication of anesthesia     slow to wake up  . Blind left eye   . HOH (hard of hearing)   . TIA (transient ischemic attack)     7 YEARS AGO, NONE SINCE  . Pneumonia oct 2015    ASPIRATION PNEUMONIA X 3- 4 TIMES IN PAST  . COPD (chronic obstructive pulmonary disease) (HCC)     pseodonomonus bronchiectasis    Past Surgical History  Procedure Laterality Date  . Abdominal hysterectomy    . Right shoulder surgery  years ago    for fracture  . Tonsillectomy  as child    There were no vitals filed for this visit.  Visit Diagnosis: Oropharyngeal dysphagia      Subjective Assessment - 02/13/15 1116    Subjective Pt reports completing oral care <30 minutes beofre tx today, and that new exercises went well yesterday at ALF.               ADULT SLP TREATMENT - 02/13/15 1125    General Information   Behavior/Cognition  Alert;Pleasant mood;Cooperative   Treatment Provided   Treatment provided Dysphagia   Dysphagia Treatment   Patient observed directly with PO's Yes   Type of PO's observed Ice chips   Pharyngeal Phase Signs & Symptoms Immediate throat clear  <5% of the time   Other treatment/comments Identical placement as last session (3a) with NMES used with HEP completion. Pt achieved effortful swallow approx 20% of the time, with many of the swallows incomplete and/or without notable thyroid elevation. SLP used coban today to improve the contact of the electrodes.    Pain Assessment   Pain Assessment No/denies pain   Assessment / Recommendations / Plan   Plan Continue with current plan of care   Progression Toward Goals   Progression toward goals Progressing toward goals          SLP Education - 02/12/15 1635    Education provided Yes   Education Details HEP   Person(s) Educated Patient;Child(ren)   Methods Explanation;Demonstration;Verbal cues;Handout   Comprehension Verbalized understanding;Returned demonstration          SLP Short Term Goals - 02/13/15 1516    SLP SHORT TERM GOAL #1  Title pt will demo thyroid elevation on 18/20 swallows   Time 4   Period Weeks   Status On-going   SLP SHORT TERM GOAL #2   Title pt will demo effortful swallow/pronounced thyroid elevation with PO ice chips and without resulting hydrophonic voice in 30 swallows 50% success   Time 4   Period Weeks   Status On-going   SLP SHORT TERM GOAL #3   Title pt will complete 100 swallows a session during 5 sessions   Time 4   Period Weeks   Status On-going   SLP SHORT TERM GOAL #4   Title pt will demo her HEP with rare min A   Time 4   Period Weeks   Status On-going          SLP Long Term Goals - 02/13/15 1516    SLP LONG TERM GOAL #1   Title pt will demo her HEP with modified independence   Time 8   Period Weeks   Status On-going   SLP LONG TERM GOAL #2   Title pt will tell SLP 5 signs of  aspiration PNA with rare min A   Time 8   Period Weeks   Status On-going   SLP LONG TERM GOAL #3   Title pt will demo effortful swallow/pronounced thyroid elevation with PO ice chips and without resulting hydrophonic voice in 50 swallows 75% success   Time 8   Period Weeks   Status On-going          Plan - 02/13/15 1516    Clinical Impression Statement Pt performed her HEP with NMES placed, again with reduced stamina for >15-20 minutes of swallowing practice. Pt will need skilled ST to continue to maximize swallowing ability.   Speech Therapy Frequency 3x / week   Duration --  8 weeks   Treatment/Interventions Aspiration precaution training;Pharyngeal strengthening exercises;Trials of upgraded texture/liquids;Compensatory techniques;NMES   Potential to Achieve Goals Fair   Potential Considerations Severity of impairments;Previous level of function        Problem List Patient Active Problem List   Diagnosis Date Noted  . Oropharyngeal dysphagia   . Dysphagia   . Protein-calorie malnutrition, moderate (HCC) 12/03/2014  . Hypokalemia 12/02/2014  . Hypomagnesemia 12/02/2014  . Bronchiectasis with Pseudomonas without acute exacerbation (HCC) 12/01/2014  . Chronic antibiotic suppression - Azithromycin for chronic bronchiectasis with Pseudomonas 12/01/2014  . HOH (hard of hearing)   . COPD (chronic obstructive pulmonary disease) (HCC)   . TIA (transient ischemic attack)   . GERD (gastroesophageal reflux disease)   . Slurring of speech 11/30/2014  . Colovaginal fistula s/p robotic colectomy/repair 11/28/2014 11/28/2014  . Recurrent aspiration pneumonia (HCC) 12/26/2013  . Involutional osteoporosis 05/28/2013  . Lumbosacral spondylosis 05/01/2013  . Cranial arteritis (HCC) 02/05/2013  . Cornea scar 02/05/2013  . 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 Temporal arteritis (HCC) 02/05/2013    Jandi Swiger , MS, CCC-SLP 02/13/2015, 3:17 PM  [facility] [address] [place], [place], [address] Phone: [phone]   Fax:  [phone]   Name: Azelia Reiger MRN: [id number] Date of Birth: [birth year]

## 2015-02-13 NOTE — Patient Instructions (Signed)
Continue with your exercises.

## 2015-02-17 ENCOUNTER — Ambulatory Visit: Payer: Medicare Other

## 2015-02-17 DIAGNOSIS — R1312 Dysphagia, oropharyngeal phase: Secondary | ICD-10-CM

## 2015-02-17 NOTE — Therapy (Signed)
[facility] [address] [place], [place], [address] Phone: [phone]   Fax:  [phone]  Speech Language Pathology Treatment  Patient Details  Name: Julie OkaBettie Richardson MRN: [id number] Date of Birth: 03/03/1925 Referring Provider: Raelyn MoraQuilty, Eileen, NP-C  Encounter Date: 02/17/2015      End of Session - 02/17/15 1455    Visit Number 4   Number of Visits 25   Date for SLP Re-Evaluation 04/06/15   SLP Start Time 1402   SLP Stop Time  1445   SLP Time Calculation (min) 43 min   Activity Tolerance Patient tolerated treatment well      Past Medical History  Diagnosis Date  . HOH (hard of hearing)   . Anemia   . Diabetes mellitus without complication (HCC)   . History of home oxygen therapy     uses 2 liters at hs and prn during day  . Atrial fibrillation (HCC)     hx of  . Spinal stenosis   . GERD (gastroesophageal reflux disease)   . Chronic kidney disease     stage 3  . Arthritis   . Dysphagia     uses thickened liquids  . Hyperlipidemia   . Pelvis fracture (HCC) 6 years ago  . Complication of anesthesia     slow to wake up  . Blind left eye   . HOH (hard of hearing)   . TIA (transient ischemic attack)     7 YEARS AGO, NONE SINCE  . Pneumonia oct 2015    ASPIRATION PNEUMONIA X 3- 4 TIMES IN PAST  . COPD (chronic obstructive pulmonary disease) (HCC)     pseodonomonus bronchiectasis    Past Surgical History  Procedure Laterality Date  . Abdominal hysterectomy    . Right shoulder surgery  years ago    for fracture  . Tonsillectomy  as child    There were no vitals filed for this visit.  Visit Diagnosis: Oropharyngeal dysphagia      Subjective Assessment - 02/17/15 1419    Subjective Pt reports completing oral care <30 minutes beofre tx today, and that she cont with exercises at ALF.   Patient is accompained by: Family member  Aurther Lofterry, son               ADULT SLP TREATMENT - 02/17/15 1420     General Information   Behavior/Cognition Alert;Pleasant mood;Cooperative   Treatment Provided   Treatment provided Dysphagia   Dysphagia Treatment   Temperature Spikes Noted No   Treatment Methods Skilled observation;Therapeutic exercise   Patient observed directly with PO's Yes   Type of PO's observed Ice chips   Feeding --  SLP provided ice chips for pt   Pharyngeal Phase Signs & Symptoms Delayed throat clear   Other treatment/comments SLP facilitated pt work with strengthening swallowing musculature by using pt's HEP along with NMES for swallowing, with placement 3b and 8.5-9.5 mA. Pt with effortful swallow 25% with fatigue noted after 8 minutes ID'd by decr'd thyroid amplitude upon elevation as well as speed of elevation/release motion. After 11 minutes pt took 3 minute break. Pt with cont decr'd thyroid elevation and movement up/down 2 minutes later., requiring consistent min A for hard swallows. Another 3 minute break was deemed necessary. With MAs[place], pt achieved what appeared to be complete swallow approx. 20% of the time.    Pain Assessment   Pain Assessment No/denies pain   Assessment / Recommendations / Plan   Plan Continue with  current plan of care   Dysphagia Recommendations   Diet recommendations NPO;Other(comment)  ice chips <30 minutes after oral care   Progression Toward Goals   Progression toward goals Progressing toward goals            SLP Short Term Goals - 02/17/15 1456    SLP SHORT TERM GOAL #1   Title pt will demo thyroid elevation on 18/20 swallows   Time 3   Period Weeks   Status On-going   SLP SHORT TERM GOAL #2   Title pt will demo effortful swallow/pronounced thyroid elevation with PO ice chips and without resulting hydrophonic voice in 30 swallows 50% success   Time 3   Period Weeks   Status On-going   SLP SHORT TERM GOAL #3   Title pt will complete 100 swallows a session during 5 sessions   Time 3   Period Weeks   Status On-going   SLP  SHORT TERM GOAL #4   Title pt will demo her HEP with rare min A   Time 3   Period Weeks   Status On-going          SLP Long Term Goals - 02/17/15 1457    SLP LONG TERM GOAL #1   Title pt will demo her HEP with modified independence   Time 7   Period Weeks   Status On-going   SLP LONG TERM GOAL #2   Title pt will tell SLP 5 signs of aspiration PNA with rare min A   Time 7   Period Weeks   Status On-going   SLP LONG TERM GOAL #3   Title pt will demo effortful swallow/pronounced thyroid elevation with PO ice chips and without resulting hydrophonic voice in 50 swallows 75% success   Time 7   Period Weeks   Status On-going          Plan - 02/17/15 1456    Clinical Impression Statement Pt performed her HEP with NMES placed, again with reduced stamina after approx 10 minutes of swallowing practice. Pt will need skilled ST to continue to maximize swallowing ability.    Speech Therapy Frequency 3x / week   Duration --  7 weeks   Treatment/Interventions Aspiration precaution training;Pharyngeal strengthening exercises;Trials of upgraded texture/liquids;Compensatory techniques;NMES   Potential to Achieve Goals Fair   Potential Considerations Severity of impairments;Previous level of function        Problem List Patient Active Problem List   Diagnosis Date Noted  . Oropharyngeal dysphagia   . Dysphagia   . Protein-calorie malnutrition, moderate (HCC) 12/03/2014  . Hypokalemia 12/02/2014  . Hypomagnesemia 12/02/2014  . Bronchiectasis with Pseudomonas without acute exacerbation (HCC) 12/01/2014  . Chronic antibiotic suppression - Azithromycin for chronic bronchiectasis with Pseudomonas 12/01/2014  . HOH (hard of hearing)   . COPD (chronic obstructive pulmonary disease) (HCC)   . TIA (transient ischemic attack)   . GERD (gastroesophageal reflux disease)   . Slurring of speech 11/30/2014  . Colovaginal fistula s/p robotic colectomy/repair 11/28/2014 11/28/2014  . Recurrent  aspiration pneumonia (HCC) 12/26/2013  . Involutional osteoporosis 05/28/2013  . Lumbosacral spondylosis 05/01/2013  . Cranial arteritis (HCC) 02/05/2013  . Cornea scar 02/05/2013  . 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 Temporal arteritis (HCC) 02/05/2013    SCHINKE,CARL , MS, CCC-SLP  02/17/2015, 2:57 PM  [facility] [address] [place], [place], [address] Phone: [phone]   Fax:  [phone]   Name: Geriann Lafont MRN: [id number] Date  of Birth: Jan 13, 1926

## 2015-02-18 ENCOUNTER — Ambulatory Visit: Payer: Medicare Other

## 2015-02-18 DIAGNOSIS — R1312 Dysphagia, oropharyngeal phase: Secondary | ICD-10-CM | POA: Diagnosis not present

## 2015-02-18 NOTE — Therapy (Signed)
[facility] [address] [place], [place], [address] Phone: [phone]   Fax:  [phone]  Speech Language Pathology Treatment  Patient Details  Name: Julie Richardson MRN: [id number] Date of Birth: 07/20/1925 Referring Provider: Raelyn MoraQuilty, Eileen, NP-C  Encounter Date: 02/18/2015      End of Session - 02/18/15 1215    Visit Number 5   Number of Visits 25   Date for SLP Re-Evaluation 04/06/15   SLP Start Time 1103   SLP Stop Time  1145   SLP Time Calculation (min) 42 min   Activity Tolerance Patient limited by fatigue      Past Medical History  Diagnosis Date  . HOH (hard of hearing)   . Anemia   . Diabetes mellitus without complication (HCC)   . History of home oxygen therapy     uses 2 liters at hs and prn during day  . Atrial fibrillation (HCC)     hx of  . Spinal stenosis   . GERD (gastroesophageal reflux disease)   . Chronic kidney disease     stage 3  . Arthritis   . Dysphagia     uses thickened liquids  . Hyperlipidemia   . Pelvis fracture (HCC) 6 years ago  . Complication of anesthesia     slow to wake up  . Blind left eye   . HOH (hard of hearing)   . TIA (transient ischemic attack)     7 YEARS AGO, NONE SINCE  . Pneumonia oct 2015    ASPIRATION PNEUMONIA X 3- 4 TIMES IN PAST  . COPD (chronic obstructive pulmonary disease) (HCC)     pseodonomonus bronchiectasis    Past Surgical History  Procedure Laterality Date  . Abdominal hysterectomy    . Right shoulder surgery  years ago    for fracture  . Tonsillectomy  as child    There were no vitals filed for this visit.  Visit Diagnosis: Oropharyngeal dysphagia      Subjective Assessment - 02/18/15 1115    Subjective Pt reports completing oral care <30 minutes beofre tx today, and that she cont with exercises at ALF.               ADULT SLP TREATMENT - 02/18/15 1115    General Information   Behavior/Cognition Alert;Pleasant  mood;Cooperative   Treatment Provided   Treatment provided Dysphagia   Dysphagia Treatment   Treatment Methods Skilled observation;Therapeutic exercise   Patient observed directly with PO's Yes   Type of PO's observed Ice chips   Pharyngeal Phase Signs & Symptoms Delayed throat clear   Other treatment/comments SLP worked with pt today with her HEP along with NMES for swallowoing placement 3b (horizontal) and mA 8.0-9.0. Pt reported a therapeutic pull at 8.250mA. SLP noted pt linugal pumping after 8 swallows so SLP provided 90 second rest. Pt with incr'd expectorating today, as well as incr'd frequency of throat clearing. No s/s hyrdrophonic voice between swallows. After 20 minutes pt's amplitude of thyroid elevation decr'd, and her speed of elevation/lowering increased, demonstrating fatigue. SLP had pt copmlete "eee" pitch exercises. After 10 non-swallowoing "eee" reps, pt resumed swallowing effortfully, with minimal change in thyroid elevation smplitude or thyroid elevation and lowering remained fast. Pt was directed to complete lingual raise (to roof of mouth) exercises, and ten more reps of "eee" with what appeared to be good success.    Pain Assessment   Pain Assessment No/denies pain  SLP Short Term Goals - 02/18/15 1217    SLP SHORT TERM GOAL #1   Title pt will demo thyroid elevation on 18/20 swallows   Time 3   Period Weeks   Status On-going   SLP SHORT TERM GOAL #2   Title pt will demo effortful swallow/pronounced thyroid elevation with PO ice chips and without resulting hydrophonic voice in 30 swallows 50% success   Time 3   Period Weeks   Status On-going   SLP SHORT TERM GOAL #3   Title pt will complete 100 swallows a session during 5 sessions   Time 3   Period Weeks   Status On-going   SLP SHORT TERM GOAL #4   Title pt will demo her HEP with rare min A   Time 3   Period Weeks   Status On-going          SLP Long Term Goals - 02/18/15 1217    SLP LONG  TERM GOAL #1   Title pt will demo her HEP with modified independence   Time 7   Period Weeks   Status On-going   SLP LONG TERM GOAL #2   Title pt will tell SLP 5 signs of aspiration PNA with rare min A   Time 7   Period Weeks   Status On-going   SLP LONG TERM GOAL #3   Title pt will demo effortful swallow/pronounced thyroid elevation with PO ice chips and without resulting hydrophonic voice in 50 swallows 75% success   Time 7   Period Weeks   Status On-going          Plan - 02/18/15 1216    Clinical Impression Statement Pt performed her HEP with NMES placed, again with reduced stamina after approx 3 minutes of swallowing practice, requiring breaks. Pt will need skilled ST to continue to maximize swallowing ability.    Speech Therapy Frequency 3x / week   Duration --  7 weeks   Treatment/Interventions Aspiration precaution training;Pharyngeal strengthening exercises;Trials of upgraded texture/liquids;Compensatory techniques;NMES   Potential to Achieve Goals Fair   Potential Considerations Severity of impairments;Previous level of function        Problem List Patient Active Problem List   Diagnosis Date Noted  . Oropharyngeal dysphagia   . Dysphagia   . Protein-calorie malnutrition, moderate (HCC) 12/03/2014  . Hypokalemia 12/02/2014  . Hypomagnesemia 12/02/2014  . Bronchiectasis with Pseudomonas without acute exacerbation (HCC) 12/01/2014  . Chronic antibiotic suppression - Azithromycin for chronic bronchiectasis with Pseudomonas 12/01/2014  . HOH (hard of hearing)   . COPD (chronic obstructive pulmonary disease) (HCC)   . TIA (transient ischemic attack)   . GERD (gastroesophageal reflux disease)   . Slurring of speech 11/30/2014  . Colovaginal fistula s/p robotic colectomy/repair 11/28/2014 11/28/2014  . Recurrent aspiration pneumonia (HCC) 12/26/2013  . Involutional osteoporosis 05/28/2013  . Lumbosacral spondylosis 05/01/2013  . Cranial arteritis (HCC) 02/05/2013   . Cornea scar 02/05/2013  . 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 Temporal arteritis (HCC) 02/05/2013    SCHINKE,CARL , MS, CCC-SLP 02/18/2015, 12:18 PM  [place] [facility] [address] [place], [place], [address] Phone: [phone]   Fax:  [phone]   Name: Julie Richardson MRN: [id number] Date of Birth: 02-Apr-1925

## 2015-02-19 ENCOUNTER — Ambulatory Visit: Payer: Medicare Other

## 2015-02-19 DIAGNOSIS — R1312 Dysphagia, oropharyngeal phase: Secondary | ICD-10-CM

## 2015-02-19 NOTE — Therapy (Signed)
[facility] [address] [place], [place], [address] Phone: [phone]   Fax:  [phone]  Speech Language Pathology Treatment  Patient Details  Name: Julie Richardson MRN: [id number] Date of Birth: [birth year] Referring Provider: Raelyn Mora, NP-C  Encounter Date: 02/19/2015      End of Session - 02/19/15 1456    Visit Number 6   Number of Visits 25   Date for SLP Re-Evaluation 04/06/15   SLP Start Time 1417   SLP Stop Time  (p) 1450  late start by SLP   SLP Time Calculation (min) 33 min   Activity Tolerance (p) Patient tolerated treatment well      Past Medical History  Diagnosis Date  . HOH (hard of hearing)   . Anemia   . Diabetes mellitus without complication (HCC)   . History of home oxygen therapy     uses 2 liters at hs and prn during day  . Atrial fibrillation (HCC)     hx of  . Spinal stenosis   . GERD (gastroesophageal reflux disease)   . Chronic kidney disease     stage 3  . Arthritis   . Dysphagia     uses thickened liquids  . Hyperlipidemia   . Pelvis fracture (HCC) 6 years ago  . Complication of anesthesia     slow to wake up  . Blind left eye   . HOH (hard of hearing)   . TIA (transient ischemic attack)     7 YEARS AGO, NONE SINCE  . Pneumonia oct 2015    ASPIRATION PNEUMONIA X 3- 4 TIMES IN PAST  . COPD (chronic obstructive pulmonary disease) (HCC)     pseodonomonus bronchiectasis    Past Surgical History  Procedure Laterality Date  . Abdominal hysterectomy    . Right shoulder surgery  years ago    for fracture  . Tonsillectomy  as child    There were no vitals filed for this visit.  Visit Diagnosis: Oropharyngeal dysphagia      Subjective Assessment - 02/19/15 1441    Subjective Pt reports completing oral care <30 minutes beofre tx today, and that she cont with exercises at ALF.               ADULT SLP TREATMENT - 02/19/15 1441    General Information    Behavior/Cognition Alert;Pleasant mood;Cooperative   Treatment Provided   Treatment provided Dysphagia   Dysphagia Treatment   Temperature Spikes Noted No   Treatment Methods Skilled observation;Therapeutic exercise   Patient observed directly with PO's Yes   Type of PO's observed Ice chips   Pharyngeal Phase Signs & Symptoms Delayed throat clear  with <5% of swallows   Other treatment/comments Incr'd speed/diminished ROM seen with thyroid elevation/release after 18 minutes, consistently. SLP interspersed non-swallowing HEP exercises to provide pt some rest. SLP worked with pt's swallow function along with NMES for swallowing with placement 3b and mA 8.5-9.5. Complete swallows seen approx 15% of the time, but less as session progressed >15 minutes.   Pain Assessment   Pain Assessment No/denies pain   Assessment / Recommendations / Plan   Plan Continue with current plan of care   Dysphagia Recommendations   Diet recommendations NPO;Other(comment)  ice chips <30 minutes after oral care   Progression Toward Goals   Progression toward goals Progressing toward goals            SLP Short Term Goals - 02/19/15 1518  SLP SHORT TERM GOAL #1   Title pt will demo thyroid elevation on 18/20 swallows   Time 3   Period Weeks   Status On-going   SLP SHORT TERM GOAL #2   Title pt will demo effortful swallow/pronounced thyroid elevation with PO ice chips and without resulting hydrophonic voice in 30 swallows 50% success   Time 3   Period Weeks   Status On-going   SLP SHORT TERM GOAL #3   Title pt will complete 100 swallows a session during 5 sessions   Time 3   Period Weeks   Status On-going   SLP SHORT TERM GOAL #4   Title pt will demo her HEP with rare min A   Time 3   Period Weeks   Status On-going          SLP Long Term Goals - 02/19/15 1518    SLP LONG TERM GOAL #1   Title pt will demo her HEP with modified independence   Time 7   Period Weeks   Status On-going   SLP  LONG TERM GOAL #2   Title pt will tell SLP 5 signs of aspiration PNA with rare min A   Time 7   Period Weeks   Status On-going   SLP LONG TERM GOAL #3   Title pt will demo effortful swallow/pronounced thyroid elevation with PO ice chips and without resulting hydrophonic voice in 50 swallows 75% success   Time 7   Period Weeks   Status On-going          Plan - 02/19/15 1517    Clinical Impression Statement Pt performed her HEP with NMES placed, with notable decr in strength after approx 15 minutes of swallowing practice, requiring breaks. Pt will need skilled ST to continue to maximize swallowing ability.    Speech Therapy Frequency 3x / week   Duration --  7 weeks   Treatment/Interventions Aspiration precaution training;Pharyngeal strengthening exercises;Trials of upgraded texture/liquids;Compensatory techniques;NMES   Potential to Achieve Goals Fair   Potential Considerations Severity of impairments;Previous level of function        Problem List Patient Active Problem List   Diagnosis Date Noted  . Oropharyngeal dysphagia   . Dysphagia   . Protein-calorie malnutrition, moderate (HCC) 12/03/2014  . Hypokalemia 12/02/2014  . Hypomagnesemia 12/02/2014  . Bronchiectasis with Pseudomonas without acute exacerbation (HCC) 12/01/2014  . Chronic antibiotic suppression - Azithromycin for chronic bronchiectasis with Pseudomonas 12/01/2014  . HOH (hard of hearing)   . COPD (chronic obstructive pulmonary disease) (HCC)   . TIA (transient ischemic attack)   . GERD (gastroesophageal reflux disease)   . Slurring of speech 11/30/2014  . Colovaginal fistula s/p robotic colectomy/repair 11/28/2014 11/28/2014  . Recurrent aspiration pneumonia (HCC) 12/26/2013  . Involutional osteoporosis 05/28/2013  . Lumbosacral spondylosis 05/01/2013  . Cranial arteritis (HCC) 02/05/2013  . Cornea scar 02/05/2013  . 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 Temporal arteritis (HCC) 02/05/2013     SCHINKE,CARL , MS, CCC-SLP  02/19/2015, 3:19 PM  [place] [facility] [address] [place], [place], [address] Phone: [phone]   Fax:  [phone]   Name: Julie OkaBettie Hoelzer MRN: [id number] Date of Birth: 10/02/1925

## 2015-02-25 ENCOUNTER — Ambulatory Visit: Payer: Medicare Other | Attending: Family Medicine

## 2015-02-25 DIAGNOSIS — R1312 Dysphagia, oropharyngeal phase: Secondary | ICD-10-CM | POA: Insufficient documentation

## 2015-02-25 NOTE — Patient Instructions (Signed)
Continue with your exercises. 

## 2015-02-25 NOTE — Therapy (Signed)
[facility] [address] [place], [place], [address] Phone: [phone]   Fax:  [phone]  Speech Language Pathology Treatment  Patient Details  Name: Roxi Hlavaty MRN: [id number] Date of Birth: September 05, 1925 Referring Provider: Raelyn Mora, NP-C  Encounter Date: 02/25/2015      End of Session - 02/25/15 1354    Visit Number 7   Number of Visits 25   Date for SLP Re-Evaluation 04/06/15   SLP Start Time 1102   SLP Stop Time  1145   SLP Time Calculation (min) 43 min   Activity Tolerance Patient tolerated treatment well      Past Medical History  Diagnosis Date  . HOH (hard of hearing)   . Anemia   . Diabetes mellitus without complication (HCC)   . History of home oxygen therapy     uses 2 liters at hs and prn during day  . Atrial fibrillation (HCC)     hx of  . Spinal stenosis   . GERD (gastroesophageal reflux disease)   . Chronic kidney disease     stage 3  . Arthritis   . Dysphagia     uses thickened liquids  . Hyperlipidemia   . Pelvis fracture (HCC) 6 years ago  . Complication of anesthesia     slow to wake up  . Blind left eye   . HOH (hard of hearing)   . TIA (transient ischemic attack)     7 YEARS AGO, NONE SINCE  . Pneumonia oct 2015    ASPIRATION PNEUMONIA X 3- 4 TIMES IN PAST  . COPD (chronic obstructive pulmonary disease) (HCC)     pseodonomonus bronchiectasis    Past Surgical History  Procedure Laterality Date  . Abdominal hysterectomy    . Right shoulder surgery  years ago    for fracture  . Tonsillectomy  as child    There were no vitals filed for this visit.  Visit Diagnosis: Oropharyngeal dysphagia      Subjective Assessment - 02/25/15 1109    Subjective Pt reports completing oral care <30 minutes beofre tx today, and that she cont with exercises at ALF.   Currently in Pain? No/denies               ADULT SLP TREATMENT - 02/25/15 1109    General Information   Behavior/Cognition Alert;Pleasant mood;Cooperative   Treatment Provided   Treatment provided Dysphagia   Dysphagia Treatment   Temperature Spikes Noted No   Treatment Methods Skilled observation;Therapeutic exercise   Patient observed directly with PO's Yes   Type of PO's observed Ice chips   Pharyngeal Phase Signs & Symptoms Delayed throat clear  with approx 5% of swallows   Other treatment/comments SLP facilitated improved swallowing by instructing pt to perform her HEP as well as to swallow ice chips with NMES placed placement 3b with mA  9.0-9.5. Weakness (by reduced thyroid elevation consistently) observed after approx 20 minutes, with a two minute rest break provided earlier in the session. Effortful swallows produced 60% of the time, complete swallows 40% of the time.   Assessment / Recommendations / Plan   Plan Continue with current plan of care   Dysphagia Recommendations   Diet recommendations --  NPO with ice chips after <30 minutes after oral care   Progression Toward Goals   Progression toward goals Progressing toward goals            SLP Short Term Goals - 02/25/15 1356  SLP SHORT TERM GOAL #1   Title pt will demo thyroid elevation on 18/20 swallows   Time 2   Period Weeks   Status On-going   SLP SHORT TERM GOAL #2   Title pt will demo effortful swallow/pronounced thyroid elevation with PO ice chips and without resulting hydrophonic voice in 30 swallows 50% success   Time 2   Period Weeks   Status On-going   SLP SHORT TERM GOAL #3   Title pt will complete 100 swallows a session during 5 sessions   Time 2   Period Weeks   Status On-going   SLP SHORT TERM GOAL #4   Title pt will demo her HEP with rare min A   Time 2   Period Weeks   Status On-going          SLP Long Term Goals - 02/25/15 1357    SLP LONG TERM GOAL #1   Title pt will demo her HEP with modified independence   Time 6   Period Weeks   Status On-going   SLP LONG TERM GOAL #2   Title  pt will tell SLP 5 signs of aspiration PNA with rare min A   Time 6   Period Weeks   Status On-going   SLP LONG TERM GOAL #3   Title pt will demo effortful swallow/pronounced thyroid elevation with PO ice chips and without resulting hydrophonic voice in 50 swallows 75% success   Time 6   Period Weeks   Status On-going          Plan - 02/25/15 1354    Clinical Impression Statement Pt performed her HEP with NMES placed, with notable decr in strength after 20 minutes of swallowing practice, and one 2-minute break. Pt's swallow did not fatigue as quickly today. Pt will need skilled ST to continue to maximize swallowing ability.    Speech Therapy Frequency 3x / week   Duration --  6 weeks   Treatment/Interventions Aspiration precaution training;Pharyngeal strengthening exercises;Trials of upgraded texture/liquids;Compensatory techniques;NMES   Potential to Achieve Goals Fair   Potential Considerations Severity of impairments;Previous level of function        Problem List Patient Active Problem List   Diagnosis Date Noted  . Oropharyngeal dysphagia   . Dysphagia   . Protein-calorie malnutrition, moderate (HCC) 12/03/2014  . Hypokalemia 12/02/2014  . Hypomagnesemia 12/02/2014  . Bronchiectasis with Pseudomonas without acute exacerbation (HCC) 12/01/2014  . Chronic antibiotic suppression - Azithromycin for chronic bronchiectasis with Pseudomonas 12/01/2014  . HOH (hard of hearing)   . COPD (chronic obstructive pulmonary disease) (HCC)   . TIA (transient ischemic attack)   . GERD (gastroesophageal reflux disease)   . Slurring of speech 11/30/2014  . Colovaginal fistula s/p robotic colectomy/repair 11/28/2014 11/28/2014  . Recurrent aspiration pneumonia (HCC) 12/26/2013  . Involutional osteoporosis 05/28/2013  . Lumbosacral spondylosis 05/01/2013  . Cranial arteritis (HCC) 02/05/2013  . Cornea scar 02/05/2013  . 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 Temporal arteritis  (HCC) 02/05/2013    Laverne Klugh , MS, CCC-SLP   02/25/2015, 1:58 PM  [facility] [address] [place], [place], [address] Phone: [phone]   Fax:  [phone]   Name: Jamison OkaBettie Murph MRN: [id number] Date of Birth: 01/11/1926

## 2015-02-26 ENCOUNTER — Ambulatory Visit: Payer: Medicare Other

## 2015-02-26 DIAGNOSIS — R1312 Dysphagia, oropharyngeal phase: Secondary | ICD-10-CM

## 2015-02-26 NOTE — Therapy (Signed)
[facility] [address] [place], [place], [address] Phone: [phone]   Fax:  [phone]  Speech Language Pathology Treatment  Patient Details  Name: Julie OkaBettie Richardson MRN: [id number] Date of Birth: 07/30/1925 Referring Provider: Raelyn MoraQuilty, Eileen, NP-C  Encounter Date: 02/26/2015      End of Session - 02/26/15 1218    Visit Number 8   Number of Visits 25   Date for SLP Re-Evaluation 04/06/15   SLP Start Time 1017   SLP Stop Time  1100   SLP Time Calculation (min) 43 min   Activity Tolerance Patient tolerated treatment well      Past Medical History  Diagnosis Date  . HOH (hard of hearing)   . Anemia   . Diabetes mellitus without complication (HCC)   . History of home oxygen therapy     uses 2 liters at hs and prn during day  . Atrial fibrillation (HCC)     hx of  . Spinal stenosis   . GERD (gastroesophageal reflux disease)   . Chronic kidney disease     stage 3  . Arthritis   . Dysphagia     uses thickened liquids  . Hyperlipidemia   . Pelvis fracture (HCC) 6 years ago  . Complication of anesthesia     slow to wake up  . Blind left eye   . HOH (hard of hearing)   . TIA (transient ischemic attack)     7 YEARS AGO, NONE SINCE  . Pneumonia oct 2015    ASPIRATION PNEUMONIA X 3- 4 TIMES IN PAST  . COPD (chronic obstructive pulmonary disease) (HCC)     pseodonomonus bronchiectasis    Past Surgical History  Procedure Laterality Date  . Abdominal hysterectomy    . Right shoulder surgery  years ago    for fracture  . Tonsillectomy  as child    There were no vitals filed for this visit.  Visit Diagnosis: Oropharyngeal dysphagia      Subjective Assessment - 02/26/15 1036    Subjective Pt reports completing oral care <30 minutes beofre tx today. She cont with exercises at ALF.               ADULT SLP TREATMENT - 02/26/15 1036    General Information   Behavior/Cognition Alert;Pleasant  mood;Cooperative   Treatment Provided   Treatment provided Dysphagia   Dysphagia Treatment   Temperature Spikes Noted No   Treatment Methods Skilled observation;Therapeutic exercise   Patient observed directly with PO's Yes   Type of PO's observed Ice chips   Pharyngeal Phase Signs & Symptoms Delayed throat clear  5-10% of swallows   Other treatment/comments NMES was used today by SLP to facilitate pt's swallow strength and coordinatinon with placement 3a and mA at 10.0 max. Pt primarily practiced effortful swallow and Masako. Pt maintained notable effortful swallow evidenced by exaggerated thyroid elevation, until  20 minutes into therapy (without break). After this time, SLP gave pt 30-45 second breaks and pt maintained effortful swallows for approx 60% of the time after 20 minutes.   Pain Assessment   Pain Assessment No/denies pain   Assessment / Recommendations / Plan   Plan Continue with current plan of care   Dysphagia Recommendations   Diet recommendations NPO  with ice chips <30 min after oral care   Progression Toward Goals   Progression toward goals Progressing toward goals            SLP Short Term  Goals - 02/26/15 1220    SLP SHORT TERM GOAL #1   Title pt will demo thyroid elevation on 18/20 swallows   Status Achieved   SLP SHORT TERM GOAL #2   Title pt will demo effortful swallow/pronounced thyroid elevation with PO ice chips and without resulting hydrophonic voice in 30 swallows 50% success   Status Achieved   SLP SHORT TERM GOAL #3   Title pt will complete 100 swallows a session during 5 sessions   Baseline one session 02-26-15   Time 2   Period Weeks   Status On-going   SLP SHORT TERM GOAL #4   Title pt will demo her HEP with rare min A   Time 2   Period Weeks   Status On-going          SLP Long Term Goals - 02/26/15 1220    SLP LONG TERM GOAL #1   Title pt will demo her HEP with modified independence   Time 6   Period Weeks   Status On-going    SLP LONG TERM GOAL #2   Title pt will tell SLP 5 signs of aspiration PNA with rare min A   Time 6   Period Weeks   Status On-going   SLP LONG TERM GOAL #3   Title pt will demo effortful swallow/pronounced thyroid elevation with PO ice chips and without resulting hydrophonic voice in 50 swallows 75% success   Time 6   Period Weeks   Status On-going          Plan - 02/26/15 1219    Clinical Impression Statement Pt performed her HEP with NMES placed, with notable decr in strength after 20 minutes of swallowing practice,no breaks. Pt's swallow cont as strong today as it was yesterday. Pt will need skilled ST to continue to maximize swallowing ability.    Speech Therapy Frequency 3x / week   Duration --  6 weeks   Treatment/Interventions Aspiration precaution training;Pharyngeal strengthening exercises;Trials of upgraded texture/liquids;Compensatory techniques;NMES   Potential to Achieve Goals Fair   Potential Considerations Severity of impairments;Previous level of function        Problem List Patient Active Problem List   Diagnosis Date Noted  . Oropharyngeal dysphagia   . Dysphagia   . Protein-calorie malnutrition, moderate (HCC) 12/03/2014  . Hypokalemia 12/02/2014  . Hypomagnesemia 12/02/2014  . Bronchiectasis with Pseudomonas without acute exacerbation (HCC) 12/01/2014  . Chronic antibiotic suppression - Azithromycin for chronic bronchiectasis with Pseudomonas 12/01/2014  . HOH (hard of hearing)   . COPD (chronic obstructive pulmonary disease) (HCC)   . TIA (transient ischemic attack)   . GERD (gastroesophageal reflux disease)   . Slurring of speech 11/30/2014  . Colovaginal fistula s/p robotic colectomy/repair 11/28/2014 11/28/2014  . Recurrent aspiration pneumonia (HCC) 12/26/2013  . Involutional osteoporosis 05/28/2013  . Lumbosacral spondylosis 05/01/2013  . Cranial arteritis (HCC) 02/05/2013  . Cornea scar 02/05/2013  . 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 Temporal arteritis (HCC) 02/05/2013    Julie Richardson , MS, CCC-SLP  02/26/2015, 12:21 PM  [facility] [address] [place], [place], [address] Phone: [phone]   Fax:  [phone]   Name: Marissah Vandemark MRN: [id number] Date of Birth: 02-Oct-1925

## 2015-02-27 ENCOUNTER — Ambulatory Visit: Payer: Medicare Other

## 2015-02-27 DIAGNOSIS — R1312 Dysphagia, oropharyngeal phase: Secondary | ICD-10-CM | POA: Diagnosis not present

## 2015-02-27 NOTE — Therapy (Signed)
[facility] [address] [place], [place], [address] Phone: [phone]   Fax:  [phone]  Speech Language Pathology Treatment  Patient Details  Name: Julie OkaBettie Brosh MRN: [id number] Date of Birth: 02/13/1926 Referring Provider: Raelyn MoraQuilty, Eileen, NP-C  Encounter Date: 02/27/2015      End of Session - 02/27/15 1401    Visit Number 9   Number of Visits 25   Date for SLP Re-Evaluation 04/06/15   SLP Start Time 1318   SLP Stop Time  1359   SLP Time Calculation (min) 41 min   Activity Tolerance Patient tolerated treatment well      Past Medical History  Diagnosis Date  . HOH (hard of hearing)   . Anemia   . Diabetes mellitus without complication (HCC)   . History of home oxygen therapy     uses 2 liters at hs and prn during day  . Atrial fibrillation (HCC)     hx of  . Spinal stenosis   . GERD (gastroesophageal reflux disease)   . Chronic kidney disease     stage 3  . Arthritis   . Dysphagia     uses thickened liquids  . Hyperlipidemia   . Pelvis fracture (HCC) 6 years ago  . Complication of anesthesia     slow to wake up  . Blind left eye   . HOH (hard of hearing)   . TIA (transient ischemic attack)     7 YEARS AGO, NONE SINCE  . Pneumonia oct 2015    ASPIRATION PNEUMONIA X 3- 4 TIMES IN PAST  . COPD (chronic obstructive pulmonary disease) (HCC)     pseodonomonus bronchiectasis    Past Surgical History  Procedure Laterality Date  . Abdominal hysterectomy    . Right shoulder surgery  years ago    for fracture  . Tonsillectomy  as child    There were no vitals filed for this visit.  Visit Diagnosis: Oropharyngeal dysphagia      Subjective Assessment - 02/27/15 1328    Subjective Pt reports completing oral care <30 minutes beofre tx today. She cont with exercises at ALF.   Currently in Pain? No/denies               ADULT SLP TREATMENT - 02/27/15 1329    General Information    Behavior/Cognition Alert;Pleasant mood;Cooperative   Treatment Provided   Treatment provided Dysphagia   Dysphagia Treatment   Temperature Spikes Noted No   Treatment Methods Skilled observation;Therapeutic exercise   Patient observed directly with PO's Yes   Type of PO's observed Ice chips   Pharyngeal Phase Signs & Symptoms Delayed throat clear;Delayed cough   Other treatment/comments Placement 3b was used with pt placed with NMES at max 10.480mA performing effortful swallows with ice chips as well as Masako swallow. In 7 minutes of Masako following one hard swallow, SLP noted pt's decr'd thyroid elevation. After 17 minutes SLP noted decr'd time for entire thyroid elevation and slight reduced elevation height, indicating fatigue. Pt produced effortful swallow with approx 60% frequency after 19 minutes.   Assessment / Recommendations / Plan   Plan Continue with current plan of care   Dysphagia Recommendations   Diet recommendations NPO  ice chips following oral care <30 minutes prior   Progression Toward Goals   Progression toward goals Progressing toward goals            SLP Short Term Goals - 02/27/15 1403    SLP SHORT  TERM GOAL #1   Title pt will demo thyroid elevation on 18/20 swallows   Status Achieved   SLP SHORT TERM GOAL #2   Title pt will demo effortful swallow/pronounced thyroid elevation with PO ice chips and without resulting hydrophonic voice in 30 swallows 50% success   Status Achieved   SLP SHORT TERM GOAL #3   Title pt will complete 100 swallows a session during 5 sessions   Baseline two session 02-27-15   Time 2   Period Weeks   Status On-going   SLP SHORT TERM GOAL #4   Title pt will demo her HEP with rare min A   Time 2   Period Weeks   Status On-going          SLP Long Term Goals - 02/27/15 1403    SLP LONG TERM GOAL #1   Title pt will demo her HEP with modified independence   Time 6   Period Weeks   Status On-going   SLP LONG TERM GOAL #2    Title pt will tell SLP 5 signs of aspiration PNA with rare min A   Time 6   Period Weeks   Status On-going   SLP LONG TERM GOAL #3   Title pt will demo effortful swallow/pronounced thyroid elevation with PO ice chips and without resulting hydrophonic voice in 50 swallows 75% success   Time 6   Period Weeks   Status On-going          Plan - 02/27/15 1401    Clinical Impression Statement Pt performed portions of her HEP with NMES placed,and ice chips. Pt's swallow cont almost as strong today as it was yesterday. Pt will need skilled ST to continue to maximize swallowing ability.    Speech Therapy Frequency 3x / week   Duration --  6 weeks   Treatment/Interventions Aspiration precaution training;Pharyngeal strengthening exercises;Trials of upgraded texture/liquids;Compensatory techniques;NMES   Potential to Achieve Goals Fair   Potential Considerations Severity of impairments;Previous level of function        Problem List Patient Active Problem List   Diagnosis Date Noted  . Oropharyngeal dysphagia   . Dysphagia   . Protein-calorie malnutrition, moderate (HCC) 12/03/2014  . Hypokalemia 12/02/2014  . Hypomagnesemia 12/02/2014  . Bronchiectasis with Pseudomonas without acute exacerbation (HCC) 12/01/2014  . Chronic antibiotic suppression - Azithromycin for chronic bronchiectasis with Pseudomonas 12/01/2014  . HOH (hard of hearing)   . COPD (chronic obstructive pulmonary disease) (HCC)   . TIA (transient ischemic attack)   . GERD (gastroesophageal reflux disease)   . Slurring of speech 11/30/2014  . Colovaginal fistula s/p robotic colectomy/repair 11/28/2014 11/28/2014  . Recurrent aspiration pneumonia (HCC) 12/26/2013  . Involutional osteoporosis 05/28/2013  . Lumbosacral spondylosis 05/01/2013  . Cranial arteritis (HCC) 02/05/2013  . Cornea scar 02/05/2013  . 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 Temporal arteritis (HCC) 02/05/2013    SCHINKE,CARL , MS,  CCC-SLP 02/27/2015, 2:04 PM  [facility] [address] [place], [place], [address] Phone: [phone]   Fax:  [phone]   Name: Bryah Ocheltree MRN: [id number] Date of Birth: [birth year]

## 2015-03-03 ENCOUNTER — Ambulatory Visit: Payer: Medicare Other

## 2015-03-03 DIAGNOSIS — R1312 Dysphagia, oropharyngeal phase: Secondary | ICD-10-CM | POA: Diagnosis not present

## 2015-03-03 NOTE — Therapy (Signed)
[facility] [address] [place], [place], [address] Phone: [phone]   Fax:  [phone]  Speech Language Pathology Treatment  Patient Details  Name: Julie Richardson MRN: [id number] Date of Birth: 04/07/1925 Referring Provider: Raelyn MoraQuilty, Eileen, NP-C  Encounter Date: 03/03/2015      End of Session - 03/03/15 1533    Visit Number 10   Date for SLP Re-Evaluation 04/06/15   SLP Start Time 1446   SLP Stop Time  1530   SLP Time Calculation (min) 44 min   Activity Tolerance Patient tolerated treatment well      Past Medical History  Diagnosis Date  . HOH (hard of hearing)   . Anemia   . Diabetes mellitus without complication (HCC)   . History of home oxygen therapy     uses 2 liters at hs and prn during day  . Atrial fibrillation (HCC)     hx of  . Spinal stenosis   . GERD (gastroesophageal reflux disease)   . Chronic kidney disease     stage 3  . Arthritis   . Dysphagia     uses thickened liquids  . Hyperlipidemia   . Pelvis fracture (HCC) 6 years ago  . Complication of anesthesia     slow to wake up  . Blind left eye   . HOH (hard of hearing)   . TIA (transient ischemic attack)     7 YEARS AGO, NONE SINCE  . Pneumonia oct 2015    ASPIRATION PNEUMONIA X 3- 4 TIMES IN PAST  . COPD (chronic obstructive pulmonary disease) (HCC)     pseodonomonus bronchiectasis    Past Surgical History  Procedure Laterality Date  . Abdominal hysterectomy    . Right shoulder surgery  years ago    for fracture  . Tonsillectomy  as child    There were no vitals filed for this visit.  Visit Diagnosis: Oropharyngeal dysphagia      Subjective Assessment - 03/03/15 1515    Subjective Pt reports completing oral care <30 minutes beofre tx today. She cont with exercises at ALF. She brought her HEP with her today.               ADULT SLP TREATMENT - 03/03/15 1516    General Information   Behavior/Cognition  Alert;Pleasant mood;Cooperative   Treatment Provided   Treatment provided Dysphagia   Dysphagia Treatment   Temperature Spikes Noted No   Treatment Methods Skilled observation;Therapeutic exercise   Patient observed directly with PO's Yes   Type of PO's observed Ice chips   Pharyngeal Phase Signs & Symptoms Delayed throat clear;Immediate cough   Other treatment/comments SLP used placement 3a today to assist in eliciting swallow reflex with HEP and with ice chips. Pt req'd rare min A with towel hold (dynamic, cuesfor 1 second holds instead of 2-3 second holds). With ice chips pt noted to fatigue after 17 minutes witnessed by quicker thyroid elevation/relax cycle and incr'd tongue pumping. SLP provided rest for 45-60 seconds. Pt demonstrated fatigue again approx 2 minutes into next cycle - evidenced by tongue pumping. SLP used mA no gerater than 8.5 to achieve clinical therapeutic level, determined by SLP questioning and audible swallows..   Assessment / Recommendations / Plan   Plan Continue with current plan of care   Dysphagia Recommendations   Diet recommendations NPO  ice chips <30 minutes after oral care   Progression Toward Goals   Progression toward goals Progressing toward goals  SLP Education - 03/23/2015 1532    Education provided Yes   Education Details HEP (towel hold - dynamic)   Person(s) Educated Patient   Methods Explanation;Demonstration;Verbal cues   Comprehension Verbalized understanding;Returned demonstration          SLP Short Term Goals - 03/23/2015 1534    SLP SHORT TERM GOAL #1   Title pt will demo thyroid elevation on 18/20 swallows   Status Achieved   SLP SHORT TERM GOAL #2   Title pt will demo effortful swallow/pronounced thyroid elevation with PO ice chips and without resulting hydrophonic voice in 30 swallows 50% success   Status Achieved   SLP SHORT TERM GOAL #3   Title pt will complete 100 swallows a session during 5 sessions   Baseline  three session 03-23-2015   Time 1   Period Weeks   Status On-going   SLP SHORT TERM GOAL #4   Title pt will demo her HEP with rare min A   Status Achieved          SLP Long Term Goals - 03/23/2015 1535    SLP LONG TERM GOAL #1   Title pt will demo her HEP with modified independence   Time 5   Period Weeks   Status On-going   SLP LONG TERM GOAL #2   Title pt will tell SLP 5 signs of aspiration PNA with rare min A   Time 5   Period Weeks   Status On-going   SLP LONG TERM GOAL #3   Title pt will demo effortful swallow/pronounced thyroid elevation with PO ice chips and without resulting hydrophonic voice in 50 swallows 75% success   Time 5   Period Weeks   Status On-going          Plan - 2015-03-23 1533    Clinical Impression Statement Pt performed portions of her HEP with NMES placed,and ice chips. Pt's swallow demo'd fatigue 17-18 minutes into sessino without breaks, and pt req'd breaks following that time. Pt will need skilled ST to continue to maximize swallowing ability.    Speech Therapy Frequency 3x / week   Duration --  5 weeks   Treatment/Interventions Aspiration precaution training;Pharyngeal strengthening exercises;Trials of upgraded texture/liquids;Compensatory techniques;NMES   Potential to Achieve Goals Fair   Potential Considerations Severity of impairments;Previous level of function          G-Codes - 2015-03-23 1535    Functional Assessment Tool Used MBSS completed 01-07-15   Functional Limitations Swallowing   Swallow Current Status (Z6109) At least 40 percent but less than 60 percent impaired, limited or restricted   Swallow Goal Status (U0454) At least 40 percent but less than 60 percent impaired, limited or restricted    Speech Therapy Progress Note  Dates of Reporting Period: 02-06-15 to present  Objective: Pt has undergone 9 therapy sessions of swallowing therapy with skilled ST utilizing NMES for swallowing to elicit stronger swallow  response.  Objective Measurements: Pt's swallow response appears to have improved in stamina/strength, as she demonstrates fatigue approx 18 minutes into therapy without breaks, compared to approx 15-20 minutes with breaks.  Goal Update: see above.  Plan: cont to see pt for approx 5 more weeks and measure goals to see if further therapy warranted.  Reason Skilled Services are Required: Pt cont to improve her swallow function using NMES for swallowing.   Problem List Patient Active Problem List   Diagnosis Date Noted  . Oropharyngeal dysphagia   . Dysphagia   .  Protein-calorie malnutrition, moderate (HCC) 12/03/2014  . Hypokalemia 12/02/2014  . Hypomagnesemia 12/02/2014  . Bronchiectasis with Pseudomonas without acute exacerbation (HCC) 12/01/2014  . Chronic antibiotic suppression - Azithromycin for chronic bronchiectasis with Pseudomonas 12/01/2014  . HOH (hard of hearing)   . COPD (chronic obstructive pulmonary disease) (HCC)   . TIA (transient ischemic attack)   . GERD (gastroesophageal reflux disease)   . Slurring of speech 11/30/2014  . Colovaginal fistula s/p robotic colectomy/repair 11/28/2014 11/28/2014  . Recurrent aspiration pneumonia (HCC) 12/26/2013  . Involutional osteoporosis 05/28/2013  . Lumbosacral spondylosis 05/01/2013  . Cranial arteritis (HCC) 02/05/2013  . Cornea scar 02/05/2013  . 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 Temporal arteritis (HCC) 02/05/2013    Camryn Lampson , MS, CCC-SLP  03/03/2015, 3:36 PM  [facility] [address] [place], [place], [address] Phone: [phone]   Fax:  [phone]   Name: Julie Richardson MRN: [id number] Date of Birth: 02-Dec-1925

## 2015-03-05 ENCOUNTER — Ambulatory Visit: Payer: Medicare Other

## 2015-03-05 DIAGNOSIS — R1312 Dysphagia, oropharyngeal phase: Secondary | ICD-10-CM | POA: Diagnosis not present

## 2015-03-05 NOTE — Therapy (Signed)
[facility] [address] [place], [place], [address] Phone: [phone]   Fax:  [phone]  Speech Language Pathology Treatment  Patient Details  Name: Julie Richardson MRN: [id number] Date of Birth: 1925/11/26 Referring Provider: Raelyn Mora, NP-C  Encounter Date: 03/05/2015      End of Session - 03/05/15 1052    Visit Number 11   Number of Visits 25   Date for SLP Re-Evaluation 04/06/15   SLP Start Time 1019   SLP Stop Time  1100   SLP Time Calculation (min) 41 min   Activity Tolerance Patient tolerated treatment well      Past Medical History  Diagnosis Date  . HOH (hard of hearing)   . Anemia   . Diabetes mellitus without complication (HCC)   . History of home oxygen therapy     uses 2 liters at hs and prn during day  . Atrial fibrillation (HCC)     hx of  . Spinal stenosis   . GERD (gastroesophageal reflux disease)   . Chronic kidney disease     stage 3  . Arthritis   . Dysphagia     uses thickened liquids  . Hyperlipidemia   . Pelvis fracture (HCC) 6 years ago  . Complication of anesthesia     slow to wake up  . Blind left eye   . HOH (hard of hearing)   . TIA (transient ischemic attack)     7 YEARS AGO, NONE SINCE  . Pneumonia oct 2015    ASPIRATION PNEUMONIA X 3- 4 TIMES IN PAST  . COPD (chronic obstructive pulmonary disease) (HCC)     pseodonomonus bronchiectasis    Past Surgical History  Procedure Laterality Date  . Abdominal hysterectomy    . Right shoulder surgery  years ago    for fracture  . Tonsillectomy  as child    There were no vitals filed for this visit.  Visit Diagnosis: Oropharyngeal dysphagia      Subjective Assessment - 03/05/15 1049    Subjective Pt reports completing oral care <30 minutes beofre tx today. She cont with exercises at ALF. "Seemslike I can swallow my saliva better," pt stated.               ADULT SLP TREATMENT - 03/05/15 1038    General Information   Behavior/Cognition Alert;Pleasant mood;Cooperative   Treatment Provided   Treatment provided Dysphagia   Dysphagia Treatment   Temperature Spikes Noted No   Treatment Methods Skilled observation;Therapeutic exercise   Patient observed directly with PO's Yes   Type of PO's observed Ice chips   Pharyngeal Phase Signs & Symptoms Immediate throat clear;Delayed throat clear  on <5% of swallows   Other treatment/comments SLP used placement 3a to elicit effortful swallows as well as to go through some of pt's HEP. Pt with fatigue noted 16 minutes into session via incr'd frequency tongue pumping and reduced elapsed time of thyroid elevation/depression. SLP gave pt breaks from swallowing and had pt perform non-swallowing exercises for 1-3 minutes. mA used today were not > 9.5 and pt told SLP she felt a pulling at that level.   Pain Assessment   Pain Assessment No/denies pain   Assessment / Recommendations / Plan   Plan Continue with current plan of care   Dysphagia Recommendations   Diet recommendations NPO  ice chips when <30 minutes after oral care   Progression Toward Goals   Progression toward goals Progressing toward goals  SLP Short Term Goals - 03/05/15 1055    SLP SHORT TERM GOAL #1   Title pt will demo thyroid elevation on 18/20 swallows   Status Achieved   SLP SHORT TERM GOAL #2   Title pt will demo effortful swallow/pronounced thyroid elevation with PO ice chips and without resulting hydrophonic voice in 30 swallows 50% success   Status Achieved   SLP SHORT TERM GOAL #3   Title pt will complete 100 swallows a session during 5 sessions   Baseline four session 03-05-15   Time 1   Period Weeks   Status On-going   SLP SHORT TERM GOAL #4   Title pt will demo her HEP with rare min A   Status Achieved          SLP Long Term Goals - 03/05/15 1055    SLP LONG TERM GOAL #1   Title pt will demo her HEP with modified independence   Time 5    Period Weeks   Status On-going   SLP LONG TERM GOAL #2   Title pt will tell SLP 5 signs of aspiration PNA with rare min A   Time 5   Period Weeks   Status On-going   SLP LONG TERM GOAL #3   Title pt will demo effortful swallow/pronounced thyroid elevation with PO ice chips and without resulting hydrophonic voice in 50 swallows 75% success   Time 5   Period Weeks   Status On-going          Plan - 03/05/15 1053    Clinical Impression Statement Pt performed portions of her HEP with NMES placed,and ice chips. Pt's swallow demo'd fatigue approx 16 minutes into sessino without breaks, and pt req'd breaks following that time which SLP used with non-swallowing HEP exercises.. Pt will need skilled ST to continue to maximize swallowing ability.    Speech Therapy Frequency 3x / week   Duration --  5 weeks   Treatment/Interventions Aspiration precaution training;Pharyngeal strengthening exercises;Trials of upgraded texture/liquids;Compensatory techniques;NMES   Potential to Achieve Goals Fair   Potential Considerations Severity of impairments;Previous level of function        Problem List Patient Active Problem List   Diagnosis Date Noted  . Oropharyngeal dysphagia   . Dysphagia   . Protein-calorie malnutrition, moderate (HCC) 12/03/2014  . Hypokalemia 12/02/2014  . Hypomagnesemia 12/02/2014  . Bronchiectasis with Pseudomonas without acute exacerbation (HCC) 12/01/2014  . Chronic antibiotic suppression - Azithromycin for chronic bronchiectasis with Pseudomonas 12/01/2014  . HOH (hard of hearing)   . COPD (chronic obstructive pulmonary disease) (HCC)   . TIA (transient ischemic attack)   . GERD (gastroesophageal reflux disease)   . Slurring of speech 11/30/2014  . Colovaginal fistula s/p robotic colectomy/repair 11/28/2014 11/28/2014  . Recurrent aspiration pneumonia (HCC) 12/26/2013  . Involutional osteoporosis 05/28/2013  . Lumbosacral spondylosis 05/01/2013  . Cranial arteritis  (HCC) 02/05/2013  . Cornea scar 02/05/2013  . 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 Temporal arteritis (HCC) 02/05/2013    Mykeria Garman , MS, CCC-SLP  03/05/2015, 11:02 AM  [facility] [address] [place], [place], [address] Phone: [phone]   Fax:  [phone]   Name: Julie Richardson MRN: [id number] Date of Birth: 06/09/1925

## 2015-03-05 NOTE — Patient Instructions (Signed)
Continue with exercises at home. 

## 2015-03-06 ENCOUNTER — Ambulatory Visit: Payer: Medicare Other

## 2015-03-06 DIAGNOSIS — R1312 Dysphagia, oropharyngeal phase: Secondary | ICD-10-CM

## 2015-03-06 NOTE — Therapy (Signed)
[facility] [address] [place], [place], [address] Phone: [phone]   Fax:  [phone]  Speech Language Pathology Treatment  Patient Details  Name: Julie Richardson MRN: [id number] Date of Birth: 08-20-1925 Referring Provider: Raelyn Mora, NP-C  Encounter Date: 03/06/2015      End of Session - 03/06/15 1100    Visit Number 12   Number of Visits 25   Date for SLP Re-Evaluation 04/06/15   SLP Start Time 1019   SLP Stop Time  1100   SLP Time Calculation (min) 41 min      Past Medical History  Diagnosis Date  . HOH (hard of hearing)   . Anemia   . Diabetes mellitus without complication (HCC)   . History of home oxygen therapy     uses 2 liters at hs and prn during day  . Atrial fibrillation (HCC)     hx of  . Spinal stenosis   . GERD (gastroesophageal reflux disease)   . Chronic kidney disease     stage 3  . Arthritis   . Dysphagia     uses thickened liquids  . Hyperlipidemia   . Pelvis fracture (HCC) 6 years ago  . Complication of anesthesia     slow to wake up  . Blind left eye   . HOH (hard of hearing)   . TIA (transient ischemic attack)     7 YEARS AGO, NONE SINCE  . Pneumonia oct 2015    ASPIRATION PNEUMONIA X 3- 4 TIMES IN PAST  . COPD (chronic obstructive pulmonary disease) (HCC)     pseodonomonus bronchiectasis    Past Surgical History  Procedure Laterality Date  . Abdominal hysterectomy    . Right shoulder surgery  years ago    for fracture  . Tonsillectomy  as child    There were no vitals filed for this visit.  Visit Diagnosis: Oropharyngeal dysphagia      Subjective Assessment - 03/06/15 1039    Subjective Pt reports completing oral care <30 minutes beofre tx today. She cont with exercises at ALF.               ADULT SLP TREATMENT - 03/06/15 1039    General Information   Behavior/Cognition Alert;Pleasant mood;Cooperative   Treatment Provided   Treatment  provided Dysphagia   Dysphagia Treatment   Temperature Spikes Noted No   Treatment Methods Skilled observation;Therapeutic exercise   Patient observed directly with PO's Yes   Type of PO's observed Ice chips   Other treatment/comments SLP utilized NMES for swallowing with aspects of pt's HEP as well as ice chips with effortful swallows to strengthen pt's swallowing musculature and decr aspiration risk with POs. Pt noted to fatigue after 16 minutes by faster cycle of thyroid elevation/depression, but kept strength longer with SLP cues, until approx 23 minutes. During sesion SLP asked pt to provide herself with bolus size of ice chips she might use at home by herself. Pt's size of bolus was 1 1/2 to 3 times as large as SLP typical bolus sizes. Pt with immediate cough upon swallow with the bolus 3 times as large, and no signs difficulty with others (x6). SLP encouraged pt to stay closer to SLP bolus size during therapy sessions when practicing at home. mA today did not reach >9.5 with placement 3b.   Pain Assessment   Pain Assessment No/denies pain   Assessment / Recommendations / Plan   Plan Continue with current plan of  care   Dysphagia Recommendations   Diet recommendations NPO  ice chips <30 minutes after oral care   Progression Toward Goals   Progression toward goals Progressing toward goals            SLP Short Term Goals - 03/06/15 1101    SLP SHORT TERM GOAL #1   Title pt will demo thyroid elevation on 18/20 swallows   Status Achieved   SLP SHORT TERM GOAL #2   Title pt will demo effortful swallow/pronounced thyroid elevation with PO ice chips and without resulting hydrophonic voice in 30 swallows 50% success   Status Achieved   SLP SHORT TERM GOAL #3   Title pt will complete 100 swallows a session during 5 sessions   Baseline four session 03-05-15   Status Achieved   SLP SHORT TERM GOAL #4   Title pt will demo her HEP with rare min A   Status Achieved          SLP Long  Term Goals - 03/06/15 1101    SLP LONG TERM GOAL #1   Title pt will demo her HEP with modified independence   Time 5   Period Weeks   Status On-going   SLP LONG TERM GOAL #2   Title pt will tell SLP 5 signs of aspiration PNA with rare min A   Time 5   Period Weeks   Status On-going   SLP LONG TERM GOAL #3   Title pt will demo effortful swallow/pronounced thyroid elevation with PO ice chips and without resulting hydrophonic voice in 50 swallows 75% success   Status Achieved          Plan - 03/06/15 1100    Clinical Impression Statement Pt performed portions of her HEP with NMES placed,and ice chips. Pt's swallow demo'd fatigue approx 16 minutes into sessino without breaks, and pt req'd breaks following that time which SLP used with non-swallowing HEP exercises. With cues pt could incr frequency of effortful swallow mostly until approx 23 minutes. Pt will need skilled ST to continue to maximize swallowing ability.    Speech Therapy Frequency 3x / week   Duration --  5 weeks   Treatment/Interventions Aspiration precaution training;Pharyngeal strengthening exercises;Trials of upgraded texture/liquids;Compensatory techniques;NMES   Potential to Achieve Goals Fair   Potential Considerations Severity of impairments;Previous level of function        Problem List Patient Active Problem List   Diagnosis Date Noted  . Oropharyngeal dysphagia   . Dysphagia   . Protein-calorie malnutrition, moderate (HCC) 12/03/2014  . Hypokalemia 12/02/2014  . Hypomagnesemia 12/02/2014  . Bronchiectasis with Pseudomonas without acute exacerbation (HCC) 12/01/2014  . Chronic antibiotic suppression - Azithromycin for chronic bronchiectasis with Pseudomonas 12/01/2014  . HOH (hard of hearing)   . COPD (chronic obstructive pulmonary disease) (HCC)   . TIA (transient ischemic attack)   . GERD (gastroesophageal reflux disease)   . Slurring of speech 11/30/2014  . Colovaginal fistula s/p robotic  colectomy/repair 11/28/2014 11/28/2014  . Recurrent aspiration pneumonia (HCC) 12/26/2013  . Involutional osteoporosis 05/28/2013  . Lumbosacral spondylosis 05/01/2013  . Cranial arteritis (HCC) 02/05/2013  . Cornea scar 02/05/2013  . 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 Temporal arteritis (HCC) 02/05/2013    Parke Jandreau , MS, CCC-SLP  03/06/2015, 11:02 AM  [facility] [address] [place], [place], [address] Phone: [phone]   Fax:  [phone]   Name: Julie Richardson MRN: [id number] Date of Birth: 11/11/1925

## 2015-03-10 ENCOUNTER — Ambulatory Visit: Payer: Medicare Other

## 2015-03-10 DIAGNOSIS — R1312 Dysphagia, oropharyngeal phase: Secondary | ICD-10-CM

## 2015-03-11 NOTE — Therapy (Signed)
[facility] [address] [place], [place], [address] Phone: [phone]   Fax:  [phone]  Speech Language Pathology Treatment  Patient Details  Name: Julie Richardson MRN: [id number] Date of Birth: 1925-03-17 Referring Provider: Raelyn Mora, NP-C  Encounter Date: 03/10/2015      End of Session - 03/10/15 0800    Visit Number 13   Number of Visits 25   Date for SLP Re-Evaluation 04/06/15   SLP Start Time 1103   SLP Stop Time  1145   SLP Time Calculation (min) 42 min   Activity Tolerance Patient tolerated treatment well      Past Medical History  Diagnosis Date  . HOH (hard of hearing)   . Anemia   . Diabetes mellitus without complication (HCC)   . History of home oxygen therapy     uses 2 liters at hs and prn during day  . Atrial fibrillation (HCC)     hx of  . Spinal stenosis   . GERD (gastroesophageal reflux disease)   . Chronic kidney disease     stage 3  . Arthritis   . Dysphagia     uses thickened liquids  . Hyperlipidemia   . Pelvis fracture (HCC) 6 years ago  . Complication of anesthesia     slow to wake up  . Blind left eye   . HOH (hard of hearing)   . TIA (transient ischemic attack)     7 YEARS AGO, NONE SINCE  . Pneumonia oct 2015    ASPIRATION PNEUMONIA X 3- 4 TIMES IN PAST  . COPD (chronic obstructive pulmonary disease) (HCC)     pseodonomonus bronchiectasis    Past Surgical History  Procedure Laterality Date  . Abdominal hysterectomy    . Right shoulder surgery  years ago    for fracture  . Tonsillectomy  as child    There were no vitals filed for this visit.  Visit Diagnosis: Oropharyngeal dysphagia      Subjective Assessment - 03/10/15 1118    Subjective Pt reports completing oral care <30 minutes beofre tx today. She cont with exercises at ALF.               ADULT SLP TREATMENT - 03/10/15 1108    General Information   Behavior/Cognition Alert;Pleasant  mood;Cooperative   Treatment Provided   Treatment provided Dysphagia   Dysphagia Treatment   Temperature Spikes Noted No   Treatment Methods Skilled observation;Therapeutic exercise   Patient observed directly with PO's Yes   Type of PO's observed Ice chips   Pharyngeal Phase Signs & Symptoms Immediate throat clear;Delayed throat clear;Immediate cough  after 5-10% of swallows   Other treatment/comments Placement 3a of NMES used today at mA=10.0, while pt performing portion of her HEP as well as effortful swallows with ice chips. After approx 9 minutes SLP noted what appeared to be nasal regurgitation sound, however pt reported no nasal regurgitation of liquid from ice chips. Shortly after this SLP noted tongue pumping beginning from pt. SLP had pt perform non-swallowing HEP for 90 seconds, then a 90 second rest. Pt without overt s/s incoordination of swallwoing musculature following that ~2 minute break. 18 minutes into session thyroid elevation appeared to decr in time elapsed as well as elevation peak. SLP wored with pt on towel exercises to afford other swallowing musculature a break. Pt again began effortful swallows with ie cihps with incr'd thyroid elevation much like pre-18 to 20 minute swallows. After approx  10 swallows thyroid elevation again decr'd in peak.    Pain Assessment   Pain Assessment No/denies pain   Assessment / Recommendations / Plan   Plan Continue with current plan of care   Dysphagia Recommendations   Diet recommendations NPO  ice chips <30 minutes after oral care   Progression Toward Goals   Progression toward goals Progressing toward goals            SLP Short Term Goals - 03/06/15 1101    SLP SHORT TERM GOAL #1   Title pt will demo thyroid elevation on 18/20 swallows   Status Achieved   SLP SHORT TERM GOAL #2   Title pt will demo effortful swallow/pronounced thyroid elevation with PO ice chips and without resulting hydrophonic voice in 30 swallows 50%  success   Status Achieved   SLP SHORT TERM GOAL #3   Title pt will complete 100 swallows a session during 5 sessions   Baseline four session 03-05-15   Status Achieved   SLP SHORT TERM GOAL #4   Title pt will demo her HEP with rare min A   Status Achieved          SLP Long Term Goals - 03/10/15 0806    SLP LONG TERM GOAL #1   Title pt will demo her HEP with modified independence over three sessions   Time 4   Period Weeks   Status On-going   SLP LONG TERM GOAL #2   Title pt will tell SLP 5 signs of aspiration PNA with rare min A   Time 4   Period Weeks   Status On-going   SLP LONG TERM GOAL #3   Title pt will demo effortful swallow/pronounced thyroid elevation with PO ice chips and without resulting hydrophonic voice in 50 swallows 75% success   Status Achieved   SLP LONG TERM GOAL #4   Title pt will demo effortful swallow/pronounced thyroid elevation with ice chips 90% of the time for first 25 minutes therapy   Time 4   Period Weeks   Status New          Plan - 03/11/15 0801    Clinical Impression Statement Pt performed portions of her HEP with NMES placed,and ice chips. Pt's swallow demo'd fatigue beginning initially approx 10 minutes into therapy today, and more after 18 minutes into sessino without breaks, and pt req'd breaks following that time which SLP used with non-swallowing HEP exercises. With cues pt could incr frequency of effortful swallow mostly until approx 23 minutes. Pt will need skilled ST to continue to maximize swallowing ability.    Speech Therapy Frequency 3x / week   Duration 4 weeks   Treatment/Interventions Aspiration precaution training;Pharyngeal strengthening exercises;Trials of upgraded texture/liquids;Compensatory techniques;NMES   Potential to Achieve Goals Fair   Potential Considerations Severity of impairments;Previous level of function        Problem List Patient Active Problem List   Diagnosis Date Noted  . Oropharyngeal dysphagia    . Dysphagia   . Protein-calorie malnutrition, moderate (HCC) 12/03/2014  . Hypokalemia 12/02/2014  . Hypomagnesemia 12/02/2014  . Bronchiectasis with Pseudomonas without acute exacerbation (HCC) 12/01/2014  . Chronic antibiotic suppression - Azithromycin for chronic bronchiectasis with Pseudomonas 12/01/2014  . HOH (hard of hearing)   . COPD (chronic obstructive pulmonary disease) (HCC)   . TIA (transient ischemic attack)   . GERD (gastroesophageal reflux disease)   . Slurring of speech 11/30/2014  . Colovaginal fistula s/p robotic colectomy/repair 11/28/2014 11/28/2014  .  Recurrent aspiration pneumonia (HCC) 12/26/2013  . Involutional osteoporosis 05/28/2013  . Lumbosacral spondylosis 05/01/2013  . Cranial arteritis (HCC) 02/05/2013  . Cornea scar 02/05/2013  . 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 Temporal arteritis (HCC) 02/05/2013    Elisheba Mcdonnell , MS, CCC-SLP 03/11/2015, 8:08 AM  [facility] [address] [place], [place], [address] Phone: [phone]   Fax:  [phone]   Name: Marcie Shearon MRN: [id number] Date of Birth: 12-24-1925

## 2015-03-12 ENCOUNTER — Ambulatory Visit: Payer: Medicare Other

## 2015-03-12 DIAGNOSIS — R1312 Dysphagia, oropharyngeal phase: Secondary | ICD-10-CM

## 2015-03-12 NOTE — Therapy (Signed)
[facility] [address] [place], [place], [address] Phone: [phone]   Fax:  [phone]  Speech Language Pathology Treatment  Patient Details  Name: Julie Richardson MRN: [id number] Date of Birth: 02-Jul-1925 Referring Provider: Raelyn Mora, NP-C  Encounter Date: 03/12/2015      End of Session - 03/12/15 1401    Visit Number 14   Number of Visits 25   Date for SLP Re-Evaluation 04/06/15   SLP Start Time 1318   SLP Stop Time  1358   SLP Time Calculation (min) 40 min   Activity Tolerance Patient tolerated treatment well      Past Medical History  Diagnosis Date  . HOH (hard of hearing)   . Anemia   . Diabetes mellitus without complication (HCC)   . History of home oxygen therapy     uses 2 liters at hs and prn during day  . Atrial fibrillation (HCC)     hx of  . Spinal stenosis   . GERD (gastroesophageal reflux disease)   . Chronic kidney disease     stage 3  . Arthritis   . Dysphagia     uses thickened liquids  . Hyperlipidemia   . Pelvis fracture (HCC) 6 years ago  . Complication of anesthesia     slow to wake up  . Blind left eye   . HOH (hard of hearing)   . TIA (transient ischemic attack)     7 YEARS AGO, NONE SINCE  . Pneumonia oct 2015    ASPIRATION PNEUMONIA X 3- 4 TIMES IN PAST  . COPD (chronic obstructive pulmonary disease) (HCC)     pseodonomonus bronchiectasis    Past Surgical History  Procedure Laterality Date  . Abdominal hysterectomy    . Right shoulder surgery  years ago    for fracture  . Tonsillectomy  as child    There were no vitals filed for this visit.  Visit Diagnosis: Oropharyngeal dysphagia      Subjective Assessment - 03/12/15 1335    Subjective Pt reports completing oral care <30 minutes beofre tx today. She cont with exercises at ALF.               ADULT SLP TREATMENT - 03/12/15 0001    General Information   Behavior/Cognition Alert;Pleasant  mood;Cooperative   Treatment Provided   Treatment provided Dysphagia   Dysphagia Treatment   Temperature Spikes Noted No   Treatment Methods Skilled observation;Therapeutic exercise   Patient observed directly with PO's Yes   Type of PO's observed Ice chips   Pharyngeal Phase Signs & Symptoms Immediate throat clear;Immediate cough  on <5% of swallows   Other treatment/comments SLP used NMES for swallowing with pt to strengthen oral and pharyngeal muscles used for swallowing. Placement 3b and mA no >10.5 were used. Pt performed HEP independently, with SLP making small adjustments to practice through 3 times per day instead of twice per day, and number of reps on 3 exercises. Approximately 20 minutes into treatment today pt demo'd decr'd elevation time of thyroid, indicating fatigue. After a short break and SLP cues for effortful swallow, patient maintained effortful swallow for approx 6-7 more minutes and after that time again began showing fatigue demo'd consistently as prior in session.    Pain Assessment   Pain Assessment No/denies pain   Assessment / Recommendations / Plan   Plan Continue with current plan of care   Dysphagia Recommendations   Diet recommendations NPO  with  ice chips <30 minutes after oral care   Progression Toward Goals   Progression toward goals Progressing toward goals            SLP Short Term Goals - 03/06/15 1101    SLP SHORT TERM GOAL #1   Title pt will demo thyroid elevation on 18/20 swallows   Status Achieved   SLP SHORT TERM GOAL #2   Title pt will demo effortful swallow/pronounced thyroid elevation with PO ice chips and without resulting hydrophonic voice in 30 swallows 50% success   Status Achieved   SLP SHORT TERM GOAL #3   Title pt will complete 100 swallows a session during 5 sessions   Baseline four session 03-05-15   Status Achieved   SLP SHORT TERM GOAL #4   Title pt will demo her HEP with rare min A   Status Achieved          SLP  Long Term Goals - 03/12/15 1404    SLP LONG TERM GOAL #1   Title pt will demo her HEP with modified independence over three sessions   Baseline one session 03-12-15   Time 4   Period Weeks   Status On-going   SLP LONG TERM GOAL #2   Title pt will tell SLP 5 signs of aspiration PNA with rare min A   Time 4   Period Weeks   Status On-going   SLP LONG TERM GOAL #3   Title pt will demo effortful swallow/pronounced thyroid elevation with PO ice chips and without resulting hydrophonic voice in 50 swallows 75% success   Status Achieved   SLP LONG TERM GOAL #4   Title pt will demo effortful swallow/pronounced thyroid elevation with ice chips 90% of the time for first 25 minutes therapy   Time 4   Period Weeks   Status On-going          Plan - 03/12/15 1402    Clinical Impression Statement Pt performed portions of her HEP with NMES placed,and ice chips. Pt's swallow demo'd fatigue beginning initially approx 18 minutes into therapy today. Pt was WNL for her HEP exercises. With cues pt could incr frequency of effortful swallow. Pt will need skilled ST to continue to maximize swallowing ability. SLP updated pt's HEP with incr'd reps and number of times per day.   Speech Therapy Frequency 3x / week   Duration 4 weeks   Treatment/Interventions Aspiration precaution training;Pharyngeal strengthening exercises;Trials of upgraded texture/liquids;Compensatory techniques;NMES   Potential to Achieve Goals Fair   Potential Considerations Severity of impairments;Previous level of function        Problem List Patient Active Problem List   Diagnosis Date Noted  . Oropharyngeal dysphagia   . Dysphagia   . Protein-calorie malnutrition, moderate (HCC) 12/03/2014  . Hypokalemia 12/02/2014  . Hypomagnesemia 12/02/2014  . Bronchiectasis with Pseudomonas without acute exacerbation (HCC) 12/01/2014  . Chronic antibiotic suppression - Azithromycin for chronic bronchiectasis with Pseudomonas 12/01/2014   . HOH (hard of hearing)   . COPD (chronic obstructive pulmonary disease) (HCC)   . TIA (transient ischemic attack)   . GERD (gastroesophageal reflux disease)   . Slurring of speech 11/30/2014  . Colovaginal fistula s/p robotic colectomy/repair 11/28/2014 11/28/2014  . Recurrent aspiration pneumonia (HCC) 12/26/2013  . Involutional osteoporosis 05/28/2013  . Lumbosacral spondylosis 05/01/2013  . Cranial arteritis (HCC) 02/05/2013  . Cornea scar 02/05/2013  . 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 Temporal arteritis (HCC) 02/05/2013    Trystyn Dolley , MS, CCC-SLP  03/12/2015, 2:05 PM  [facility] [address] [place], [place], [address] Phone: [phone]   Fax:  [phone]   Name: Yakelin Grenier MRN: [id number] Date of Birth: 03-Feb-1926

## 2015-03-12 NOTE — Patient Instructions (Signed)
Make sure to make those adjustments we talked about and that I made to your exercises today.

## 2015-03-13 ENCOUNTER — Ambulatory Visit: Payer: Medicare Other

## 2015-03-13 DIAGNOSIS — R1312 Dysphagia, oropharyngeal phase: Secondary | ICD-10-CM

## 2015-03-13 NOTE — Therapy (Signed)
[facility] [address] [place], [place], [address] Phone: [phone]   Fax:  [phone]  Speech Language Pathology Treatment  Patient Details  Name: Julie Richardson MRN: [id number] Date of Birth: December 05, 1925 Referring Provider: Raelyn Mora, NP-C  Encounter Date: 03/13/2015      End of Session - 03/13/15 1709    Visit Number 15   Number of Visits 25   Date for SLP Re-Evaluation 04/06/15   SLP Start Time 1026   SLP Stop Time  1101  late start   SLP Time Calculation (min) 35 min   Activity Tolerance Patient tolerated treatment well      Past Medical History  Diagnosis Date  . HOH (hard of hearing)   . Anemia   . Diabetes mellitus without complication (HCC)   . History of home oxygen therapy     uses 2 liters at hs and prn during day  . Atrial fibrillation (HCC)     hx of  . Spinal stenosis   . GERD (gastroesophageal reflux disease)   . Chronic kidney disease     stage 3  . Arthritis   . Dysphagia     uses thickened liquids  . Hyperlipidemia   . Pelvis fracture (HCC) 6 years ago  . Complication of anesthesia     slow to wake up  . Blind left eye   . HOH (hard of hearing)   . TIA (transient ischemic attack)     7 YEARS AGO, NONE SINCE  . Pneumonia oct 2015    ASPIRATION PNEUMONIA X 3- 4 TIMES IN PAST  . COPD (chronic obstructive pulmonary disease) (HCC)     pseodonomonus bronchiectasis    Past Surgical History  Procedure Laterality Date  . Abdominal hysterectomy    . Right shoulder surgery  years ago    for fracture  . Tonsillectomy  as child    There were no vitals filed for this visit.  Visit Diagnosis: Oropharyngeal dysphagia      Subjective Assessment - 03/13/15 1036    Subjective Pt reports completing oral care <30 minutes beofre tx today. She cont with exercises at ALF.               ADULT SLP TREATMENT - 03/13/15 1036    General Information   Behavior/Cognition  Alert;Pleasant mood;Cooperative   Treatment Provided   Treatment provided Dysphagia   Dysphagia Treatment   Temperature Spikes Noted No   Treatment Methods Skilled observation;Therapeutic exercise   Patient observed directly with PO's Yes   Type of PO's observed Ice chips   Pharyngeal Phase Signs & Symptoms Immediate cough   Other treatment/comments NMES along with aspects of pt's HEP and ice chips with effortful swallows were all utilized for SLP to facilitate strengthening of pt's oral and pharyngeal musculature for swallowing. Placement 3b and mA no >10.0 were used with NMES. Approx 12 minutes into session pt began demonstrating decr'd effortfl swallow (incr'd fatigue) by decr'd thyroid elevation/elevation time. SLP engaged pt in non-swallowing exercises on her HEP at that time. Approx 16 minutes into treatment, pt again demo'd fatigue. Coughing occurred on <5% of swallows (x3).    Pain Assessment   Pain Assessment No/denies pain   Assessment / Recommendations / Plan   Plan Continue with current plan of care  Request follow up modified for next 4 weeks, after next visi   Dysphagia Recommendations   Diet recommendations NPO  ice chips <30 minutes after oral care  Progression Toward Goals   Progression toward goals Progressing toward goals            SLP Short Term Goals - 03/13/15 1710    SLP SHORT TERM GOAL #1   Title pt will demo thyroid elevation on 18/20 swallows   Status Achieved   SLP SHORT TERM GOAL #2   Title pt will demo effortful swallow/pronounced thyroid elevation with PO ice chips and without resulting hydrophonic voice in 30 swallows 50% success   Status Achieved   SLP SHORT TERM GOAL #3   Title pt will complete 100 swallows a session during 5 sessions   Status Achieved   SLP SHORT TERM GOAL #4   Title pt will demo her HEP with rare min A   Status Achieved          SLP Long Term Goals - 03/13/15 1710    SLP LONG TERM GOAL #1   Title pt will demo her HEP  with modified independence over three sessions   Baseline two sessions 03-13-15   Time 4   Period Weeks   Status On-going   SLP LONG TERM GOAL #2   Title pt will tell SLP 5 signs of aspiration PNA with rare min A   Time 4   Period Weeks   Status On-going   SLP LONG TERM GOAL #3   Title pt will demo effortful swallow/pronounced thyroid elevation with PO ice chips and without resulting hydrophonic voice in 50 swallows 75% success   Status Achieved   SLP LONG TERM GOAL #4   Title pt will demo effortful swallow/pronounced thyroid elevation with ice chips 90% of the time for first 25 minutes therapy   Time 4   Period Weeks   Status On-going          Plan - 03/13/15 1710    Clinical Impression Statement Pt performed portions of her HEP with NMES placed,and ice chips. Fatigue seen 10-15 minutes into session. Pt was WNL for her HEP exercises. With cues pt could incr frequency of effortful swallow. Pt will need skilled ST to continue to maximize swallowing ability. SLP updated pt's HEP with incr'd reps and number of times per day.   Speech Therapy Frequency 3x / week   Duration 4 weeks   Treatment/Interventions Aspiration precaution training;Pharyngeal strengthening exercises;Trials of upgraded texture/liquids;Compensatory techniques;NMES   Potential to Achieve Goals Fair   Potential Considerations Severity of impairments;Previous level of function        Problem List Patient Active Problem List   Diagnosis Date Noted  . Oropharyngeal dysphagia   . Dysphagia   . Protein-calorie malnutrition, moderate (HCC) 12/03/2014  . Hypokalemia 12/02/2014  . Hypomagnesemia 12/02/2014  . Bronchiectasis with Pseudomonas without acute exacerbation (HCC) 12/01/2014  . Chronic antibiotic suppression - Azithromycin for chronic bronchiectasis with Pseudomonas 12/01/2014  . HOH (hard of hearing)   . COPD (chronic obstructive pulmonary disease) (HCC)   . TIA (transient ischemic attack)   . GERD  (gastroesophageal reflux disease)   . Slurring of speech 11/30/2014  . Colovaginal fistula s/p robotic colectomy/repair 11/28/2014 11/28/2014  . Recurrent aspiration pneumonia (HCC) 12/26/2013  . Involutional osteoporosis 05/28/2013  . Lumbosacral spondylosis 05/01/2013  . Cranial arteritis (HCC) 02/05/2013  . Cornea scar 02/05/2013  . 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 Temporal arteritis (HCC) 02/05/2013    Marlisa Caridi , MS, CCC-SLP  03/13/2015, 5:12 PM  [facility] [address] [place], [place], [address] Phone: [phone]  Fax:  [phone]   Name: Julie Richardson MRN: [id number] Date of Birth: [birth year]

## 2015-03-16 ENCOUNTER — Ambulatory Visit: Payer: Medicare Other

## 2015-03-16 DIAGNOSIS — R1312 Dysphagia, oropharyngeal phase: Secondary | ICD-10-CM

## 2015-03-16 NOTE — Therapy (Signed)
[facility] [address] [place], [place], [address] Phone: [phone]   Fax:  [phone]  Speech Language Pathology Treatment  Patient Details  Name: Julie Richardson MRN: [id number] Date of Birth: [birth year] Referring Provider: Raelyn Mora, NP-C  Encounter Date: 03/16/2015      End of Session - 03/16/15 1053    Visit Number 16   Number of Visits 25   Date for SLP Re-Evaluation 04/06/15   SLP Start Time 1019   SLP Stop Time  1100   SLP Time Calculation (min) 41 min   Activity Tolerance Patient tolerated treatment well      Past Medical History  Diagnosis Date  . HOH (hard of hearing)   . Anemia   . Diabetes mellitus without complication (HCC)   . History of home oxygen therapy     uses 2 liters at hs and prn during day  . Atrial fibrillation (HCC)     hx of  . Spinal stenosis   . GERD (gastroesophageal reflux disease)   . Chronic kidney disease     stage 3  . Arthritis   . Dysphagia     uses thickened liquids  . Hyperlipidemia   . Pelvis fracture (HCC) 6 years ago  . Complication of anesthesia     slow to wake up  . Blind left eye   . HOH (hard of hearing)   . TIA (transient ischemic attack)     7 YEARS AGO, NONE SINCE  . Pneumonia oct 2015    ASPIRATION PNEUMONIA X 3- 4 TIMES IN PAST  . COPD (chronic obstructive pulmonary disease) (HCC)     pseodonomonus bronchiectasis    Past Surgical History  Procedure Laterality Date  . Abdominal hysterectomy    . Right shoulder surgery  years ago    for fracture  . Tonsillectomy  as child    There were no vitals filed for this visit.  Visit Diagnosis: Oropharyngeal dysphagia      Subjective Assessment - 03/16/15 1032    Subjective Pt reports completing oral care <30 minutes beofre tx today. She cont with exercises at ALF. Pt believes her swallowing is better than last month. ("I don't have to get up and spit as much.")   Currently in Pain?  No/denies               ADULT SLP TREATMENT - 03/16/15 1030    General Information   Behavior/Cognition Alert;Pleasant mood;Cooperative   Treatment Provided   Treatment provided Dysphagia   Dysphagia Treatment   Temperature Spikes Noted No   Treatment Methods Skilled observation;Therapeutic exercise   Patient observed directly with PO's Yes   Type of PO's observed Ice chips   Pharyngeal Phase Signs & Symptoms Immediate throat clear;Immediate cough;Delayed throat clear  <5% of swallows   Other treatment/comments Pt was engaged with SLP today by performing aspects of her HEP and swallowing ice ships with NMES placed with placement 3a and mA not greater than 10.5. AFter 17 minutes pt's swallow became more visibly fatigued due to reduced thyroid elevation in both amplitude and time elapsed.SLP provided verbal cues for strong swallows throughout. Pt performed pitch raise and [place] between swallows with ice chips. After 22 minutes tongue pumping was noted.   Assessment / Recommendations / Plan   Plan Continue with current plan of care   Dysphagia Recommendations   Diet recommendations NPO  ice chips no longer than 30 minutes after oral care   Progression  Toward Goals   Progression toward goals Progressing toward goals            SLP Short Term Goals - 03/13/15 1710    SLP SHORT TERM GOAL #1   Title pt will demo thyroid elevation on 18/20 swallows   Status Achieved   SLP SHORT TERM GOAL #2   Title pt will demo effortful swallow/pronounced thyroid elevation with PO ice chips and without resulting hydrophonic voice in 30 swallows 50% success   Status Achieved   SLP SHORT TERM GOAL #3   Title pt will complete 100 swallows a session during 5 sessions   Status Achieved   SLP SHORT TERM GOAL #4   Title pt will demo her HEP with rare min A   Status Achieved          SLP Long Term Goals - 03/16/15 1104    SLP LONG TERM GOAL #1   Title pt will demo her HEP with modified  independence over three sessions   Baseline two sessions 03-13-15   Time 3   Period Weeks   Status On-going   SLP LONG TERM GOAL #2   Title pt will tell SLP 5 signs of aspiration PNA with rare min A   Time 3   Period Weeks   Status On-going   SLP LONG TERM GOAL #3   Title pt will demo effortful swallow/pronounced thyroid elevation with PO ice chips and without resulting hydrophonic voice in 50 swallows 75% success   Status Achieved   SLP LONG TERM GOAL #4   Title pt will demo effortful swallow/pronounced thyroid elevation with ice chips 90% of the time for first 25 minutes therapy   Time 3   Period Weeks   Status On-going          Plan - 03/16/15 1101    Clinical Impression Statement Pt performed portions of her HEP with NMES placed,and effortful swallow with ice chips. Fatigue seen 15-20 minutes into session, and further fatigue after 20 minutes. With cues by SLP for incr'd effort, pt could incr frequency of effortful swallow for approx 5 swallows, but less as session progressed. Pt will need skilled ST to continue to maximize swallowing ability.SLP requested order for follow up modified barium swallow today from referring MD.   Speech Therapy Frequency 3x / week   Duration 4 weeks  3 weeks   Treatment/Interventions Aspiration precaution training;Pharyngeal strengthening exercises;Trials of upgraded texture/liquids;Compensatory techniques;NMES   Potential to Achieve Goals Fair   Potential Considerations Severity of impairments;Previous level of function        Problem List Patient Active Problem List   Diagnosis Date Noted  . Oropharyngeal dysphagia   . Dysphagia   . Protein-calorie malnutrition, moderate (HCC) 12/03/2014  . Hypokalemia 12/02/2014  . Hypomagnesemia 12/02/2014  . Bronchiectasis with Pseudomonas without acute exacerbation (HCC) 12/01/2014  . Chronic antibiotic suppression - Azithromycin for chronic bronchiectasis with Pseudomonas 12/01/2014  . HOH (hard of  hearing)   . COPD (chronic obstructive pulmonary disease) (HCC)   . TIA (transient ischemic attack)   . GERD (gastroesophageal reflux disease)   . Slurring of speech 11/30/2014  . Colovaginal fistula s/p robotic colectomy/repair 11/28/2014 11/28/2014  . Recurrent aspiration pneumonia (HCC) 12/26/2013  . Involutional osteoporosis 05/28/2013  . Lumbosacral spondylosis 05/01/2013  . Cranial arteritis (HCC) 02/05/2013  . Cornea scar 02/05/2013  . 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 Temporal arteritis (HCC) 02/05/2013    Ayomide Purdy , MS, CCC-SLP  03/16/2015, 11:04 AM  [facility] [address] [place], [place], [address] Phone: [phone]   Fax:  [phone]   Name: Nishika Parkhurst MRN: [id number] Date of Birth: 10/31/1925

## 2015-03-18 ENCOUNTER — Ambulatory Visit: Payer: Medicare Other

## 2015-03-18 DIAGNOSIS — R1312 Dysphagia, oropharyngeal phase: Secondary | ICD-10-CM

## 2015-03-18 NOTE — Therapy (Signed)
[facility] [address] [place], [place], [address] Phone: [phone]   Fax:  [phone]  Speech Language Pathology Treatment  Patient Details  Name: Julie Richardson MRN: [id number] Date of Birth: 1925-10-13 Referring Provider: Raelyn Mora, NP-C  Encounter Date: 03/18/2015      End of Session - 03/18/15 1055    Visit Number 17   Number of Visits 25   Date for SLP Re-Evaluation 04/06/15   SLP Start Time 1020   SLP Stop Time  1100   SLP Time Calculation (min) 40 min   Activity Tolerance Patient tolerated treatment well      Past Medical History  Diagnosis Date  . HOH (hard of hearing)   . Anemia   . Diabetes mellitus without complication (HCC)   . History of home oxygen therapy     uses 2 liters at hs and prn during day  . Atrial fibrillation (HCC)     hx of  . Spinal stenosis   . GERD (gastroesophageal reflux disease)   . Chronic kidney disease     stage 3  . Arthritis   . Dysphagia     uses thickened liquids  . Hyperlipidemia   . Pelvis fracture (HCC) 6 years ago  . Complication of anesthesia     slow to wake up  . Blind left eye   . HOH (hard of hearing)   . TIA (transient ischemic attack)     7 YEARS AGO, NONE SINCE  . Pneumonia oct 2015    ASPIRATION PNEUMONIA X 3- 4 TIMES IN PAST  . COPD (chronic obstructive pulmonary disease) (HCC)     pseodonomonus bronchiectasis    Past Surgical History  Procedure Laterality Date  . Abdominal hysterectomy    . Right shoulder surgery  years ago    for fracture  . Tonsillectomy  as child    There were no vitals filed for this visit.  Visit Diagnosis: Oropharyngeal dysphagia      Subjective Assessment - 03/18/15 1044    Subjective Pt reports completing oral care <30 minutes beofre tx today. She cont with exercises at ALF.    Currently in Pain? No/denies               ADULT SLP TREATMENT - 03/18/15 0001    General Information   Behavior/Cognition Alert;Pleasant mood;Cooperative   Treatment Provided   Treatment provided Dysphagia   Dysphagia Treatment   Temperature Spikes Noted No   Treatment Methods Skilled observation;Therapeutic exercise   Patient observed directly with PO's Yes   Type of PO's observed Ice chips   Pharyngeal Phase Signs & Symptoms Immediate cough  not necessarily associated with POs-throughout session   Other treatment/comments NMES was utilized with pt during her dysphagia therapy session focusing on effortful swallows with ice chips and performance of aspects of her HEP. Pt noted fatigue approx 20 minutes into session via shorter elevation time of and lesser extent of thyroid elevation. Pt with incr'd frequency of throat clear today however pt states due to phlegm and not due to PO intake - SLP agrees as throat clearing aoften was not associated with timing of bolus administration.    Pain Assessment   Pain Assessment No/denies pain   Assessment / Recommendations / Plan   Plan Continue with current plan of care   Dysphagia Recommendations   Diet recommendations NPO  ice chips <30 minutes after oral care   Progression Toward Goals   Progression toward goals  Progressing toward goals            SLP Short Term Goals - 03/13/15 1710    SLP SHORT TERM GOAL #1   Title pt will demo thyroid elevation on 18/20 swallows   Status Achieved   SLP SHORT TERM GOAL #2   Title pt will demo effortful swallow/pronounced thyroid elevation with PO ice chips and without resulting hydrophonic voice in 30 swallows 50% success   Status Achieved   SLP SHORT TERM GOAL #3   Title pt will complete 100 swallows a session during 5 sessions   Status Achieved   SLP SHORT TERM GOAL #4   Title pt will demo her HEP with rare min A   Status Achieved          SLP Long Term Goals - 03/16/15 1104    SLP LONG TERM GOAL #1   Title pt will demo her HEP with modified independence over three sessions   Baseline two  sessions 03-13-15   Time 3   Period Weeks   Status On-going   SLP LONG TERM GOAL #2   Title pt will tell SLP 5 signs of aspiration PNA with rare min A   Time 3   Period Weeks   Status On-going   SLP LONG TERM GOAL #3   Title pt will demo effortful swallow/pronounced thyroid elevation with PO ice chips and without resulting hydrophonic voice in 50 swallows 75% success   Status Achieved   SLP LONG TERM GOAL #4   Title pt will demo effortful swallow/pronounced thyroid elevation with ice chips 90% of the time for first 25 minutes therapy   Time 3   Period Weeks   Status On-going          Plan - 03/18/15 1055    Clinical Impression Statement Pt performed portions of her HEP with NMES placed,and effortful swallow with ice chips. Fatigue seen approx 20 minutes into session. Incr'd frequency of throat clear today pt stated and SLP agreed unrelated to PO intake.  Pt will need skilled ST to continue to maximize swallowing ability.SLP requested order for follow up modified barium swallow today from referring MD.   Speech Therapy Frequency 3x / week   Duration 4 weeks  3 weeks   Treatment/Interventions Aspiration precaution training;Pharyngeal strengthening exercises;Trials of upgraded texture/liquids;Compensatory techniques;NMES   Potential to Achieve Goals Fair   Potential Considerations Severity of impairments;Previous level of function        Problem List Patient Active Problem List   Diagnosis Date Noted  . Oropharyngeal dysphagia   . Dysphagia   . Protein-calorie malnutrition, moderate (HCC) 12/03/2014  . Hypokalemia 12/02/2014  . Hypomagnesemia 12/02/2014  . Bronchiectasis with Pseudomonas without acute exacerbation (HCC) 12/01/2014  . Chronic antibiotic suppression - Azithromycin for chronic bronchiectasis with Pseudomonas 12/01/2014  . HOH (hard of hearing)   . COPD (chronic obstructive pulmonary disease) (HCC)   . TIA (transient ischemic attack)   . GERD (gastroesophageal  reflux disease)   . Slurring of speech 11/30/2014  . Colovaginal fistula s/p robotic colectomy/repair 11/28/2014 11/28/2014  . Recurrent aspiration pneumonia (HCC) 12/26/2013  . Involutional osteoporosis 05/28/2013  . Lumbosacral spondylosis 05/01/2013  . Cranial arteritis (HCC) 02/05/2013  . Cornea scar 02/05/2013  . 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 Temporal arteritis (HCC) 02/05/2013    Maple Odaniel , MS, CCC-SLP  03/18/2015, 11:06 AM  [facility] [address] [place], [place], [address] Phone: [phone]   Fax:  [phone]   Name: Julie Richardson MRN: [id number] Date of Birth: 1925-09-25

## 2015-03-20 ENCOUNTER — Ambulatory Visit: Payer: Medicare Other

## 2015-03-20 DIAGNOSIS — R1312 Dysphagia, oropharyngeal phase: Secondary | ICD-10-CM

## 2015-03-20 NOTE — Therapy (Signed)
[facility] [address] [place], [place], [address] Phone: [phone]   Fax:  [phone]  Speech Language Pathology Treatment  Patient Details  Name: Julie Richardson MRN: [id number] Date of Birth: [birth year] Referring Provider: Raelyn Mora, NP-C  Encounter Date: 03/20/2015    Past Medical History  Diagnosis Date  . HOH (hard of hearing)   . Anemia   . Diabetes mellitus without complication (HCC)   . History of home oxygen therapy     uses 2 liters at hs and prn during day  . Atrial fibrillation (HCC)     hx of  . Spinal stenosis   . GERD (gastroesophageal reflux disease)   . Chronic kidney disease     stage 3  . Arthritis   . Dysphagia     uses thickened liquids  . Hyperlipidemia   . Pelvis fracture (HCC) 6 years ago  . Complication of anesthesia     slow to wake up  . Blind left eye   . HOH (hard of hearing)   . TIA (transient ischemic attack)     7 YEARS AGO, NONE SINCE  . Pneumonia oct 2015    ASPIRATION PNEUMONIA X 3- 4 TIMES IN PAST  . COPD (chronic obstructive pulmonary disease) (HCC)     pseodonomonus bronchiectasis    Past Surgical History  Procedure Laterality Date  . Abdominal hysterectomy    . Right shoulder surgery  years ago    for fracture  . Tonsillectomy  as child    There were no vitals filed for this visit.  Visit Diagnosis: No diagnosis found.      Subjective Assessment - 03/20/15 1030    Subjective Pt reports completing oral care <30 minutes beofre tx today. She cont with exercises at ALF.    Patient is accompained by: Family member   Currently in Pain? No/denies               ADULT SLP TREATMENT - 03/20/15 1031    General Information   Behavior/Cognition Alert;Pleasant mood;Cooperative   Treatment Provided   Treatment provided Dysphagia   Dysphagia Treatment   Temperature Spikes Noted No   Treatment Methods Skilled observation;Therapeutic exercise   Patient observed directly with PO's Yes   Type of PO's observed Ice chips   Pharyngeal Phase Signs & Symptoms Immediate cough;Immediate throat clear     NMES was utilized with placement 3a at no > 10.0 mA, with pt during her dysphagia therapy session, focusing on effortful swallows with ice chips, and in performing her full HEP - with modified independence. Pt noted to fatigue approx 18 minutes into session via shorter elevation time of thyroid, and lesser extent of thyroid elevation.        SLP Short Term Goals - 03/13/15 1710    SLP SHORT TERM GOAL #1   Title pt will demo thyroid elevation on 18/20 swallows   Status Achieved   SLP SHORT TERM GOAL #2   Title pt will demo effortful swallow/pronounced thyroid elevation with PO ice chips and without resulting hydrophonic voice in 30 swallows 50% success   Status Achieved   SLP SHORT TERM GOAL #3   Title pt will complete 100 swallows a session during 5 sessions   Status Achieved   SLP SHORT TERM GOAL #4   Title pt will demo her HEP with rare min A   Status Achieved          SLP Long Term Goals -  03/16/15 1104    SLP LONG TERM GOAL #1   Title pt will demo her HEP with modified independence over three sessions   Baseline two sessions 03-13-15   Time 3   Period Weeks   Status On-going   SLP LONG TERM GOAL #2   Title pt will tell SLP 5 signs of aspiration PNA with rare min A   Time 3   Period Weeks   Status On-going   SLP LONG TERM GOAL #3   Title pt will demo effortful swallow/pronounced thyroid elevation with PO ice chips and without resulting hydrophonic voice in 50 swallows 75% success   Status Achieved   SLP LONG TERM GOAL #4   Title pt will demo effortful swallow/pronounced thyroid elevation with ice chips 90% of the time for first 25 minutes therapy   Time 3   Period Weeks   Status On-going          Problem List Patient Active Problem List   Diagnosis Date Noted  . Oropharyngeal dysphagia   . Dysphagia   .  Protein-calorie malnutrition, moderate (HCC) 12/03/2014  . Hypokalemia 12/02/2014  . Hypomagnesemia 12/02/2014  . Bronchiectasis with Pseudomonas without acute exacerbation (HCC) 12/01/2014  . Chronic antibiotic suppression - Azithromycin for chronic bronchiectasis with Pseudomonas 12/01/2014  . HOH (hard of hearing)   . COPD (chronic obstructive pulmonary disease) (HCC)   . TIA (transient ischemic attack)   . GERD (gastroesophageal reflux disease)   . Slurring of speech 11/30/2014  . Colovaginal fistula s/p robotic colectomy/repair 11/28/2014 11/28/2014  . Recurrent aspiration pneumonia (HCC) 12/26/2013  . Involutional osteoporosis 05/28/2013  . Lumbosacral spondylosis 05/01/2013  . Cranial arteritis (HCC) 02/05/2013  . Cornea scar 02/05/2013  . 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 Temporal arteritis (HCC) 02/05/2013    SCHINKE,CARL , MS, CCC-SLP  03/20/2015, 11:02 AM  [facility] [address] [place], [place], [address] Phone: [phone]   Fax:  [phone]   Name: Terrel Manalo MRN: [id number] Date of Birth: 1925-03-06

## 2015-03-23 ENCOUNTER — Ambulatory Visit: Payer: Medicare Other

## 2015-03-23 DIAGNOSIS — R1312 Dysphagia, oropharyngeal phase: Secondary | ICD-10-CM | POA: Diagnosis not present

## 2015-03-23 NOTE — Patient Instructions (Signed)
  Julie Richardson- I attempted to call you last week and left a message for you.  I think it is a good time to get Julie Richardson scheduled for another modified barium swallowing evaluation.  You can fax a script to [phone], and schedule voice-to-voice at [phone]. May I suggest calling&faxing ASAP, and scheduling the week of 03-30-15?  Please call me with questions.  Verdie Mosher [phone]

## 2015-03-23 NOTE — Therapy (Signed)
[facility] [address] [place], [place], [address] Phone: [phone]   Fax:  [phone]  Speech Language Pathology Treatment  Patient Details  Name: Julie Richardson MRN: [id number] Date of Birth: 20-Oct-1925 Referring Provider: Raelyn Mora, NP-C  Encounter Date: 03/23/2015      End of Session - 03/23/15 1108    Visit Number 19   Number of Visits 25   Date for SLP Re-Evaluation 04/06/15   SLP Start Time 1019   SLP Stop Time  1101   SLP Time Calculation (min) 42 min   Activity Tolerance Patient tolerated treatment well      Past Medical History  Diagnosis Date  . HOH (hard of hearing)   . Anemia   . Diabetes mellitus without complication (HCC)   . History of home oxygen therapy     uses 2 liters at hs and prn during day  . Atrial fibrillation (HCC)     hx of  . Spinal stenosis   . GERD (gastroesophageal reflux disease)   . Chronic kidney disease     stage 3  . Arthritis   . Dysphagia     uses thickened liquids  . Hyperlipidemia   . Pelvis fracture (HCC) 6 years ago  . Complication of anesthesia     slow to wake up  . Blind left eye   . HOH (hard of hearing)   . TIA (transient ischemic attack)     7 YEARS AGO, NONE SINCE  . Pneumonia oct 2015    ASPIRATION PNEUMONIA X 3- 4 TIMES IN PAST  . COPD (chronic obstructive pulmonary disease) (HCC)     pseodonomonus bronchiectasis    Past Surgical History  Procedure Laterality Date  . Abdominal hysterectomy    . Right shoulder surgery  years ago    for fracture  . Tonsillectomy  as child    There were no vitals filed for this visit.  Visit Diagnosis: Oropharyngeal dysphagia      Subjective Assessment - 03/23/15 1029    Subjective Pt reports completing oral care <30 minutes beofre tx today. She cont with exercises at ALF.    Currently in Pain? No/denies               ADULT SLP TREATMENT - 03/23/15 1030    General Information   Behavior/Cognition Alert;Pleasant mood;Cooperative   Treatment Provided   Treatment provided Dysphagia   Dysphagia Treatment   Temperature Spikes Noted No   Treatment Methods Skilled observation;Therapeutic exercise   Patient observed directly with PO's Yes   Type of PO's observed Ice chips   Pharyngeal Phase Signs & Symptoms Immediate throat clear   Other treatment/comments Pt denies s/s aspiration PNA. Placement 3b with NMES used today along with portion of pts' HEP and ice chips with effrotful swallows. Tongue pumping noted 21 minutes into session. Effortful swallow overall noted 75% of the time with rare min A for using strong swallows. 24 minutes into session reduced thyroid elevation and elevation time noted 50% of the time. Cough/throar clear on <5% of swallows.   Assessment / Recommendations / Plan   Plan Continue with current plan of care   Dysphagia Recommendations   Diet recommendations NPO  ice chips <30 minutes after oral care   Progression Toward Goals   Progression toward goals Progressing toward goals            SLP Short Term Goals - 03/13/15 1710    SLP SHORT TERM GOAL #  1   Title pt will demo thyroid elevation on 18/20 swallows   Status Achieved   SLP SHORT TERM GOAL #2   Title pt will demo effortful swallow/pronounced thyroid elevation with PO ice chips and without resulting hydrophonic voice in 30 swallows 50% success   Status Achieved   SLP SHORT TERM GOAL #3   Title pt will complete 100 swallows a session during 5 sessions   Status Achieved   SLP SHORT TERM GOAL #4   Title pt will demo her HEP with rare min A   Status Achieved          SLP Long Term Goals - 03/23/15 1110    SLP LONG TERM GOAL #1   Title pt will demo her HEP with modified independence over three sessions   Time 3   Status Achieved   SLP LONG TERM GOAL #2   Title pt will tell SLP 5 signs of aspiration PNA with rare min A   Time 3   Period Weeks   Status On-going   SLP LONG TERM  GOAL #3   Title pt will demo effortful swallow/pronounced thyroid elevation with PO ice chips and without resulting hydrophonic voice in 50 swallows 75% success   Status Achieved   SLP LONG TERM GOAL #4   Title pt will demo effortful swallow/pronounced thyroid elevation with ice chips 90% of the time for first 25 minutes therapy   Time 3   Period Weeks   Status On-going          Plan - 03/23/15 1109    Clinical Impression Statement Fatigue seen approx 20 minutes into session. Pt will need skilled ST to continue to maximize swallowing ability. SLP requested order for follow up modified barium swallow today from referring MD.   Speech Therapy Frequency 3x / week   Duration 2 weeks  3 weeks   Treatment/Interventions Aspiration precaution training;Pharyngeal strengthening exercises;Trials of upgraded texture/liquids;Compensatory techniques;NMES   Potential to Achieve Goals Fair   Potential Considerations Severity of impairments;Previous level of function        Problem List Patient Active Problem List   Diagnosis Date Noted  . Oropharyngeal dysphagia   . Dysphagia   . Protein-calorie malnutrition, moderate (HCC) 12/03/2014  . Hypokalemia 12/02/2014  . Hypomagnesemia 12/02/2014  . Bronchiectasis with Pseudomonas without acute exacerbation (HCC) 12/01/2014  . Chronic antibiotic suppression - Azithromycin for chronic bronchiectasis with Pseudomonas 12/01/2014  . HOH (hard of hearing)   . COPD (chronic obstructive pulmonary disease) (HCC)   . TIA (transient ischemic attack)   . GERD (gastroesophageal reflux disease)   . Slurring of speech 11/30/2014  . Colovaginal fistula s/p robotic colectomy/repair 11/28/2014 11/28/2014  . Recurrent aspiration pneumonia (HCC) 12/26/2013  . Involutional osteoporosis 05/28/2013  . Lumbosacral spondylosis 05/01/2013  . Cranial arteritis (HCC) 02/05/2013  . Cornea scar 02/05/2013  . 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 Temporal arteritis  (HCC) 02/05/2013    SCHINKE,CARL , MS, CCC-SLP  03/23/2015, 11:10 AM  [facility] [address] [place], [place], [address] Phone: [phone]   Fax:  [phone]   Name: Tailor Lucking MRN: [id number] Date of Birth: 11/13/1925

## 2015-03-25 ENCOUNTER — Other Ambulatory Visit (HOSPITAL_COMMUNITY): Payer: Self-pay | Admitting: Family Medicine

## 2015-03-25 ENCOUNTER — Ambulatory Visit: Payer: Medicare Other | Attending: Family Medicine

## 2015-03-25 DIAGNOSIS — R1312 Dysphagia, oropharyngeal phase: Secondary | ICD-10-CM | POA: Diagnosis not present

## 2015-03-25 DIAGNOSIS — R1310 Dysphagia, unspecified: Secondary | ICD-10-CM

## 2015-03-25 NOTE — Therapy (Signed)
[facility] [address] [place], [place], [address] Phone: [phone]   Fax:  [phone]  Speech Language Pathology Treatment  Patient Details  Name: Julie Richardson MRN: [id number] Date of Birth: August 18, 1925 Referring Provider: Raelyn Mora, NP-C  Encounter Date: 03/25/2015      End of Session - 03/25/15 1058    Visit Number 20   Number of Visits 25   Date for SLP Re-Evaluation 04/06/15   SLP Start Time 1019   SLP Stop Time  1100   SLP Time Calculation (min) 41 min   Activity Tolerance Patient tolerated treatment well      Past Medical History  Diagnosis Date  . HOH (hard of hearing)   . Anemia   . Diabetes mellitus without complication (HCC)   . History of home oxygen therapy     uses 2 liters at hs and prn during day  . Atrial fibrillation (HCC)     hx of  . Spinal stenosis   . GERD (gastroesophageal reflux disease)   . Chronic kidney disease     stage 3  . Arthritis   . Dysphagia     uses thickened liquids  . Hyperlipidemia   . Pelvis fracture (HCC) 6 years ago  . Complication of anesthesia     slow to wake up  . Blind left eye   . HOH (hard of hearing)   . TIA (transient ischemic attack)     7 YEARS AGO, NONE SINCE  . Pneumonia oct 2015    ASPIRATION PNEUMONIA X 3- 4 TIMES IN PAST  . COPD (chronic obstructive pulmonary disease) (HCC)     pseodonomonus bronchiectasis    Past Surgical History  Procedure Laterality Date  . Abdominal hysterectomy    . Right shoulder surgery  years ago    for fracture  . Tonsillectomy  as child    There were no vitals filed for this visit.  Visit Diagnosis: Oropharyngeal dysphagia      Subjective Assessment - 03/25/15 1035    Subjective Pt reports completing oral care <30 minutes beofre tx today. She cont with exercises at ALF. Son calling to scheduel modified barium swallow exam.               ADULT SLP TREATMENT - 03/25/15 1035    General  Information   Behavior/Cognition Alert;Pleasant mood;Cooperative   Treatment Provided   Treatment provided Dysphagia   Dysphagia Treatment   Temperature Spikes Noted No   Respiratory Status Room air   Treatment Methods Skilled observation;Therapeutic exercise   Patient observed directly with PO's Yes   Type of PO's observed Ice chips   Pharyngeal Phase Signs & Symptoms Immediate throat clear;Immediate cough  after first 5 minutes frequency decr'd to <5% of swallows   Other treatment/comments In first 5 minutes of NMES placed pt demo'd cough/throat clear on 5-10% of swallows, pt states due to "not getting it cleared out like I usually do." NMES placed with placement 3a and mA not>10.5. NMES was used in conjunction wiht ice chips and portions of pt's HEP. Fatigue noticed after 18 minutes, but with min cues for effrotful swallow pt able to maintain effortful swallow further.. 25 minutes into therapy pt consistently wiht reduced thyrpid elevation and also total elevation time. Masako swallow showed commensurate changes over time as effortful swallow. Tongue pumping noted last 7 minutes therapy.    Pain Assessment   Pain Assessment No/denies pain   Assessment / Recommendations / Plan  Plan Continue with current plan of care   Dysphagia Recommendations   Diet recommendations NPO   Progression Toward Goals   Progression toward goals Progressing toward goals            SLP Short Term Goals - 03/13/15 1710    SLP SHORT TERM GOAL #1   Title pt will demo thyroid elevation on 18/20 swallows   Status Achieved   SLP SHORT TERM GOAL #2   Title pt will demo effortful swallow/pronounced thyroid elevation with PO ice chips and without resulting hydrophonic voice in 30 swallows 50% success   Status Achieved   SLP SHORT TERM GOAL #3   Title pt will complete 100 swallows a session during 5 sessions   Status Achieved   SLP SHORT TERM GOAL #4   Title pt will demo her HEP with rare min A   Status  Achieved          SLP Long Term Goals - 11-Apr-2015 1149    SLP LONG TERM GOAL #1   Title pt will demo her HEP with modified independence over three sessions   Time 3   Status Achieved   SLP LONG TERM GOAL #2   Title pt will tell SLP 5 signs of aspiration PNA with rare min A   Time 3   Period Weeks   Status On-going   SLP LONG TERM GOAL #3   Title pt will demo effortful swallow/pronounced thyroid elevation with PO ice chips and without resulting hydrophonic voice in 50 swallows 75% success   Status Achieved   SLP LONG TERM GOAL #4   Title pt will demo effortful swallow/pronounced thyroid elevation with ice chips 90% of the time for first 25 minutes therapy   Time 3   Period Weeks   Status On-going          Plan - 04/11/2015 1058    Clinical Impression Statement Fatigue cont'd seen approx 20 minutes into session, tongue pumping noted <20 minutes as well as decr'd thyroid elevation and decr'd time of elevation. Pt will need skilled ST to continue to maximize swallowing ability. SLP requested order for follow up modified barium swallow today from referring MD.   Speech Therapy Frequency 3x / week   Duration 2 weeks  3 weeks   Treatment/Interventions Aspiration precaution training;Pharyngeal strengthening exercises;Trials of upgraded texture/liquids;Compensatory techniques;NMES   Potential to Achieve Goals Fair   Potential Considerations Severity of impairments;Previous level of function          G-Codes - 11-Apr-2015 1149    Functional Assessment Tool Used clinical judgment, MBSS on 01-07-15   Functional Limitations Swallowing   Swallow Current Status (N5621) At least 40 percent but less than 60 percent impaired, limited or restricted   Swallow Goal Status (H0865) At least 40 percent but less than 60 percent impaired, limited or restricted     Speech Therapy Progress Note  Dates of Reporting Period: 03-03-15 to April 11, 2015  Objective: Pt has been seen for 20 visits of ST since  initial session on 02-05-15. It appears her swallowing stamina has improved using NMES in conjucntion with her HEP as well as consistently ingesting ice chips PO. She has been regimented about her HEP.  Goal Update: See above  Plan: Continue to treat patient for at least 1-2 more weeks, possibly more depending on results of modified barium swallow eval on 04-03-15.  Reason Skilled Services are Required: Pt cont to have difficulty with swallow mechanism requiring follow up skilled ST.  Problem List Patient Active Problem List   Diagnosis Date Noted  . Oropharyngeal dysphagia   . Dysphagia   . Protein-calorie malnutrition, moderate (HCC) 12/03/2014  . Hypokalemia 12/02/2014  . Hypomagnesemia 12/02/2014  . Bronchiectasis with Pseudomonas without acute exacerbation (HCC) 12/01/2014  . Chronic antibiotic suppression - Azithromycin for chronic bronchiectasis with Pseudomonas 12/01/2014  . HOH (hard of hearing)   . COPD (chronic obstructive pulmonary disease) (HCC)   . TIA (transient ischemic attack)   . GERD (gastroesophageal reflux disease)   . Slurring of speech 11/30/2014  . Colovaginal fistula s/p robotic colectomy/repair 11/28/2014 11/28/2014  . Recurrent aspiration pneumonia (HCC) 12/26/2013  . Involutional osteoporosis 05/28/2013  . Lumbosacral spondylosis 05/01/2013  . Cranial arteritis (HCC) 02/05/2013  . Cornea scar 02/05/2013  . 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 Temporal arteritis (HCC) 02/05/2013    [facility] 03/25/2015, 11:51 AM  [facility] [address] [place], [place], [address] Phone: [phone]   Fax:  [phone]   Name: Julie Richardson MRN: [id number] Date of Birth: [birth year]

## 2015-03-25 NOTE — Patient Instructions (Signed)
Continue with exercises, and the ice chips at home after you have cleaned your mouth out well.

## 2015-03-27 ENCOUNTER — Ambulatory Visit: Payer: Medicare Other

## 2015-03-27 DIAGNOSIS — R1312 Dysphagia, oropharyngeal phase: Secondary | ICD-10-CM | POA: Diagnosis not present

## 2015-03-27 NOTE — Therapy (Signed)
[facility] [address] [place], [place], [address] Phone: [phone]   Fax:  [phone]  Speech Language Pathology Treatment  Patient Details  Name: Julie Richardson MRN: [id number] Date of Birth: Nov 19, 1925 Referring Provider: Raelyn Mora, NP-C  Encounter Date: 03/27/2015      End of Session - 03/27/15 1151    Visit Number 21   Number of Visits 25   Date for SLP Re-Evaluation 04/06/15   SLP Start Time 1018   SLP Stop Time  1100   SLP Time Calculation (min) 42 min   Activity Tolerance Patient tolerated treatment well      Past Medical History  Diagnosis Date  . HOH (hard of hearing)   . Anemia   . Diabetes mellitus without complication (HCC)   . History of home oxygen therapy     uses 2 liters at hs and prn during day  . Atrial fibrillation (HCC)     hx of  . Spinal stenosis   . GERD (gastroesophageal reflux disease)   . Chronic kidney disease     stage 3  . Arthritis   . Dysphagia     uses thickened liquids  . Hyperlipidemia   . Pelvis fracture (HCC) 6 years ago  . Complication of anesthesia     slow to wake up  . Blind left eye   . HOH (hard of hearing)   . TIA (transient ischemic attack)     7 YEARS AGO, NONE SINCE  . Pneumonia oct 2015    ASPIRATION PNEUMONIA X 3- 4 TIMES IN PAST  . COPD (chronic obstructive pulmonary disease) (HCC)     pseodonomonus bronchiectasis    Past Surgical History  Procedure Laterality Date  . Abdominal hysterectomy    . Right shoulder surgery  years ago    for fracture  . Tonsillectomy  as child    There were no vitals filed for this visit.  Visit Diagnosis: Oropharyngeal dysphagia      Subjective Assessment - 03/27/15 1038    Subjective Pt reports completing oral care <30 minutes beofre tx today. She cont with exercises at ALF. Modified scheduled 04-03-15, 11AM.               ADULT SLP TREATMENT - 03/27/15 1039    General Information    Behavior/Cognition Alert;Pleasant mood;Cooperative   Treatment Provided   Treatment provided Dysphagia   Dysphagia Treatment   Temperature Spikes Noted No   Respiratory Status Room air   Treatment Methods Skilled observation;Therapeutic exercise   Patient observed directly with PO's Yes   Type of PO's observed Ice chips   Pharyngeal Phase Signs & Symptoms Immediate cough  on 0-5% of swallows   Other treatment/comments Today, pt told SLP 6 s/s aspiration PNA. NMES was utilized by SLP today to have pt strengthen swallowing musculature, in conjuction with ice chips and pt's HEP for dysphagia. Pt noted to reduce thyroid elevation approx 20 minutes into therapy today. SLP took breaks from ice chips with effortful swallow and worked with pt's HEP. Pt was independent with HEP.   Assessment / Recommendations / Plan   Plan Continue with current plan of care   Dysphagia Recommendations   Diet recommendations NPO  ice chips <30 minutes after oral care   Progression Toward Goals   Progression toward goals Progressing toward goals            SLP Short Term Goals - 03/13/15 1710    SLP SHORT  TERM GOAL #1   Title pt will demo thyroid elevation on 18/20 swallows   Status Achieved   SLP SHORT TERM GOAL #2   Title pt will demo effortful swallow/pronounced thyroid elevation with PO ice chips and without resulting hydrophonic voice in 30 swallows 50% success   Status Achieved   SLP SHORT TERM GOAL #3   Title pt will complete 100 swallows a session during 5 sessions   Status Achieved   SLP SHORT TERM GOAL #4   Title pt will demo her HEP with rare min A   Status Achieved          SLP Long Term Goals - 03/27/15 1153    SLP LONG TERM GOAL #1   Title pt will demo her HEP with modified independence over three sessions   Status Achieved   SLP LONG TERM GOAL #2   Title pt will tell SLP 5 signs of aspiration PNA with rare min A   Status Achieved   SLP LONG TERM GOAL #3   Title pt will demo  effortful swallow/pronounced thyroid elevation with PO ice chips and without resulting hydrophonic voice in 50 swallows 75% success   Status Achieved   SLP LONG TERM GOAL #4   Title pt will demo effortful swallow/pronounced thyroid elevation with ice chips 90% of the time for first 25 minutes therapy   Time 3   Period Weeks   Status On-going          Plan - 03/27/15 1152    Clinical Impression Statement Fatigue cont'd seen slightly longer than 20 minutes into session, tongue pumping noted shortly after 20 minutes in therapy. Pt will need skilled ST to continue to maximize swallowing ability.    Speech Therapy Frequency 3x / week   Duration 2 weeks  3 weeks   Treatment/Interventions Aspiration precaution training;Pharyngeal strengthening exercises;Trials of upgraded texture/liquids;Compensatory techniques;NMES   Potential to Achieve Goals Fair   Potential Considerations Severity of impairments;Previous level of function        Problem List Patient Active Problem List   Diagnosis Date Noted  . Oropharyngeal dysphagia   . Dysphagia   . Protein-calorie malnutrition, moderate (HCC) 12/03/2014  . Hypokalemia 12/02/2014  . Hypomagnesemia 12/02/2014  . Bronchiectasis with Pseudomonas without acute exacerbation (HCC) 12/01/2014  . Chronic antibiotic suppression - Azithromycin for chronic bronchiectasis with Pseudomonas 12/01/2014  . HOH (hard of hearing)   . COPD (chronic obstructive pulmonary disease) (HCC)   . TIA (transient ischemic attack)   . GERD (gastroesophageal reflux disease)   . Slurring of speech 11/30/2014  . Colovaginal fistula s/p robotic colectomy/repair 11/28/2014 11/28/2014  . Recurrent aspiration pneumonia (HCC) 12/26/2013  . Involutional osteoporosis 05/28/2013  . Lumbosacral spondylosis 05/01/2013  . Cranial arteritis (HCC) 02/05/2013  . Cornea scar 02/05/2013  . 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 Temporal arteritis (HCC) 02/05/2013     SCHINKE,CARL , MS, CCC-SLP  03/27/2015, 11:54 AM  [facility] [address] [place], [place], [address] Phone: [phone]   Fax:  [phone]   Name: Katja Blue MRN: [id number] Date of Birth: Jun 03, 1925

## 2015-03-27 NOTE — Patient Instructions (Signed)
Signs of Aspiration Pneumonia   . Chest pain/tightness . Fever (can be low grade) . Cough  o With foul-smelling phlegm (sputum) o With sputum containing pus or blood o With greenish sputum . Fatigue  . Shortness of breath  . Wheezing   **IF YOU HAVE THESE SIGNS, CONTACT YOUR DOCTOR OR GO TO THE EMERGENCY DEPARTMENT OR URGENT CARE AS SOON AS POSSIBLE**      

## 2015-03-30 ENCOUNTER — Ambulatory Visit: Payer: Medicare Other

## 2015-03-30 DIAGNOSIS — R1312 Dysphagia, oropharyngeal phase: Secondary | ICD-10-CM | POA: Diagnosis not present

## 2015-03-30 NOTE — Therapy (Signed)
[facility] [address] [place], [place], [address] Phone: [phone]   Fax:  [phone]  Speech Language Pathology Treatment  Patient Details  Name: Julie Richardson MRN: [id number] Date of Birth: Dec 10, 1925 Referring Provider: Raelyn Mora, NP-C  Encounter Date: 03/30/2015      End of Session - 03/30/15 1217    Visit Number 22   Number of Visits 25   Date for SLP Re-Evaluation 04/06/15   SLP Start Time 1017   SLP Stop Time  1100   SLP Time Calculation (min) 43 min   Activity Tolerance Patient tolerated treatment well      Past Medical History  Diagnosis Date  . HOH (hard of hearing)   . Anemia   . Diabetes mellitus without complication (HCC)   . History of home oxygen therapy     uses 2 liters at hs and prn during day  . Atrial fibrillation (HCC)     hx of  . Spinal stenosis   . GERD (gastroesophageal reflux disease)   . Chronic kidney disease     stage 3  . Arthritis   . Dysphagia     uses thickened liquids  . Hyperlipidemia   . Pelvis fracture (HCC) 6 years ago  . Complication of anesthesia     slow to wake up  . Blind left eye   . HOH (hard of hearing)   . TIA (transient ischemic attack)     7 YEARS AGO, NONE SINCE  . Pneumonia oct 2015    ASPIRATION PNEUMONIA X 3- 4 TIMES IN PAST  . COPD (chronic obstructive pulmonary disease) (HCC)     pseodonomonus bronchiectasis    Past Surgical History  Procedure Laterality Date  . Abdominal hysterectomy    . Right shoulder surgery  years ago    for fracture  . Tonsillectomy  as child    There were no vitals filed for this visit.  Visit Diagnosis: Oropharyngeal dysphagia      Subjective Assessment - 03/30/15 1030    Subjective Pt reports completing oral care <30 minutes beofre tx today. She cont with exercises at ALF. Modified scheduled 04-03-15, 11AM.   Patient is accompained by: Family member   Currently in Pain? No/denies                ADULT SLP TREATMENT - 03/30/15 1031    General Information   Behavior/Cognition Alert;Pleasant mood;Cooperative   Treatment Provided   Treatment provided Dysphagia   Dysphagia Treatment   Temperature Spikes Noted No   Respiratory Status Room air   Treatment Methods Skilled observation;Therapeutic exercise   Patient observed directly with PO's Yes   Type of PO's observed Ice chips   Pharyngeal Phase Signs & Symptoms Immediate cough;Immediate throat clear   Other treatment/comments SLP used NMES for swallowing at 9.14mA and placement 3a in conjunction wiht effortful swallows, and portions of pt's HEP for swallowing to work to improve safety wiht POs. Pt req'd min cues throughout session for maintaining effortful swallows. Pt has follow up modified barium swallow exam Friday 11AM. Twenty minutes into therapy pt's swallow demo'd reduction in thyroid elevation and also in elevation time. Tongue pumping noted after 23 minutes.Pt with notable reduction further in thyroid elevation after 26 minutes.   Assessment / Recommendations / Plan   Plan Continue with current plan of care   Dysphagia Recommendations   Diet recommendations NPO  with ice chips <30 minutes after oral care   Progression Toward  Goals   Progression toward goals Progressing toward goals            SLP Short Term Goals - 03/13/15 1710    SLP SHORT TERM GOAL #1   Title pt will demo thyroid elevation on 18/20 swallows   Status Achieved   SLP SHORT TERM GOAL #2   Title pt will demo effortful swallow/pronounced thyroid elevation with PO ice chips and without resulting hydrophonic voice in 30 swallows 50% success   Status Achieved   SLP SHORT TERM GOAL #3   Title pt will complete 100 swallows a session during 5 sessions   Status Achieved   SLP SHORT TERM GOAL #4   Title pt will demo her HEP with rare min A   Status Achieved          SLP Long Term Goals - 03/30/15 1219    SLP LONG TERM GOAL #1    Title pt will demo her HEP with modified independence over three sessions   Status Achieved   SLP LONG TERM GOAL #2   Title pt will tell SLP 5 signs of aspiration PNA with rare min A   Status Achieved   SLP LONG TERM GOAL #3   Title pt will demo effortful swallow/pronounced thyroid elevation with PO ice chips and without resulting hydrophonic voice in 50 swallows 75% success   Status Achieved   SLP LONG TERM GOAL #4   Title pt will demo effortful swallow/pronounced thyroid elevation with ice chips 90% of the time for first 25 minutes therapy   Time 1   Period Weeks   Status On-going          Plan - 03/30/15 1218    Clinical Impression Statement Fatigue cont'd seen slightly longer than 20 minutes into session, tongue pumping and reduced thyroid elevation noted. Pt will need skilled ST to continue to maximize swallowing ability. Modified barium swallow scheduled Friday 04-03-15.   Speech Therapy Frequency 3x / week   Duration 1 week  3 weeks   Treatment/Interventions Aspiration precaution training;Pharyngeal strengthening exercises;Trials of upgraded texture/liquids;Compensatory techniques;NMES   Potential to Achieve Goals Fair   Potential Considerations Severity of impairments;Previous level of function        Problem List Patient Active Problem List   Diagnosis Date Noted  . Oropharyngeal dysphagia   . Dysphagia   . Protein-calorie malnutrition, moderate (HCC) 12/03/2014  . Hypokalemia 12/02/2014  . Hypomagnesemia 12/02/2014  . Bronchiectasis with Pseudomonas without acute exacerbation (HCC) 12/01/2014  . Chronic antibiotic suppression - Azithromycin for chronic bronchiectasis with Pseudomonas 12/01/2014  . HOH (hard of hearing)   . COPD (chronic obstructive pulmonary disease) (HCC)   . TIA (transient ischemic attack)   . GERD (gastroesophageal reflux disease)   . Slurring of speech 11/30/2014  . Colovaginal fistula s/p robotic colectomy/repair 11/28/2014 11/28/2014  .  Recurrent aspiration pneumonia (HCC) 12/26/2013  . Involutional osteoporosis 05/28/2013  . Lumbosacral spondylosis 05/01/2013  . Cranial arteritis (HCC) 02/05/2013  . Cornea scar 02/05/2013  . 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 Temporal arteritis (HCC) 02/05/2013    Katerin Negrete , MS, CCC-SLP 03/30/2015, 12:20 PM  [facility] [address] [place], [place], [address] Phone: [phone]   Fax:  [phone]   Name: Julie Richardson MRN: [id number] Date of Birth: [birth year]

## 2015-04-01 ENCOUNTER — Ambulatory Visit: Payer: Medicare Other

## 2015-04-01 DIAGNOSIS — R1312 Dysphagia, oropharyngeal phase: Secondary | ICD-10-CM

## 2015-04-01 NOTE — Therapy (Signed)
[facility] [address] [place], [place], [address] Phone: [phone]   Fax:  [phone]  Speech Language Pathology Treatment  Patient Details  Name: Julie Richardson MRN: [id number] Date of Birth: 1925/06/01 Referring Provider: Towanda Octave, NP-C  Encounter Date: 04/01/2015      End of Session - 04/01/15 1055    Visit Number 23   Number of Visits 25   Date for SLP Re-Evaluation 04/06/15   SLP Start Time 74   SLP Stop Time  1100   SLP Time Calculation (min) 41 min   Activity Tolerance Patient tolerated treatment well      Past Medical History  Diagnosis Date  . HOH (hard of hearing)   . Anemia   . Diabetes mellitus without complication ([place])   . History of home oxygen therapy     uses 2 liters at hs and prn during day  . Atrial fibrillation (HCC)     hx of  . Spinal stenosis   . GERD (gastroesophageal reflux disease)   . Chronic kidney disease     stage 3  . Arthritis   . Dysphagia     uses thickened liquids  . Hyperlipidemia   . Pelvis fracture ([place]) 6 years ago  . Complication of anesthesia     slow to wake up  . Blind left eye   . HOH (hard of hearing)   . TIA (transient ischemic attack)     7 YEARS AGO, NONE SINCE  . Pneumonia oct 2015    ASPIRATION PNEUMONIA X 3- 4 TIMES IN PAST  . COPD (chronic obstructive pulmonary disease) (HCC)     pseodonomonus bronchiectasis    Past Surgical History  Procedure Laterality Date  . Abdominal hysterectomy    . Right shoulder surgery  years ago    for fracture  . Tonsillectomy  as child    There were no vitals filed for this visit.  Visit Diagnosis: Oropharyngeal dysphagia      Subjective Assessment - 04/01/15 1033    Subjective Pt reports completing oral care <30 minutes beofre tx today, and cont with exercises at ALF.               ADULT SLP TREATMENT - 04/01/15 1033    General Information   Behavior/Cognition Alert;Pleasant  mood;Cooperative   Treatment Provided   Treatment provided Dysphagia   Dysphagia Treatment   Temperature Spikes Noted No   Respiratory Status Room air   Treatment Methods Skilled observation;Therapeutic exercise   Patient observed directly with PO's Yes   Type of PO's observed Ice chips   Pharyngeal Phase Signs & Symptoms Immediate cough  on approx 10% of total swallows today   Other treatment/comments SLP treated pt with NMES at placement 3a and mA no greater than 10.0. Pt swallowed boluses with ice chips with effortful swalows and performed portions of her HEP with NMES placed all in order to strengthen swallowing musculature and improve pt's chance to eat PO diet. SLP noted pt req'd cues for effortful swallow approx 12 minutes into session today and 15 minutes into session noted to have reduced thyroid elevation and noisier swallows. By 30 minutes into session pt req'd occasional cues to cont with effortful swallows with breaks (performing exercises from her HEP) completed from effortful swallows with ice chips.   Pain Assessment   Pain Assessment No/denies pain   Assessment / Recommendations / Plan   Plan Continue with current plan of care  Dysphagia Recommendations   Diet recommendations NPO  ice chips <30 minutes after oral care   Progression Toward Goals   Progression toward goals Progressing toward goals            SLP Short Term Goals - 03/13/15 1710    SLP SHORT TERM GOAL #1   Title pt will demo thyroid elevation on 18/20 swallows   Status Achieved   SLP SHORT TERM GOAL #2   Title pt will demo effortful swallow/pronounced thyroid elevation with PO ice chips and without resulting hydrophonic voice in 30 swallows 50% success   Status Achieved   SLP SHORT TERM GOAL #3   Title pt will complete 100 swallows a session during 5 sessions   Status Achieved   SLP SHORT TERM GOAL #4   Title pt will demo her HEP with rare min A   Status Achieved          SLP Long Term  Goals - 04/01/15 1057    SLP LONG TERM GOAL #1   Title pt will demo her HEP with modified independence over three sessions   Status Achieved   SLP LONG TERM GOAL #2   Title pt will tell SLP 5 signs of aspiration PNA with rare min A   Status Achieved   SLP LONG TERM GOAL #3   Title pt will demo effortful swallow/pronounced thyroid elevation with PO ice chips and without resulting hydrophonic voice in 50 swallows 75% success   Status Achieved   SLP LONG TERM GOAL #4   Title pt will demo effortful swallow/pronounced thyroid elevation with ice chips 90% of the time for first 25 minutes therapy   Time 1   Period Weeks   Status Not Met          Plan - 04/01/15 1056    Clinical Impression Statement Fatigue seen before 20 minutes into session, with reduced thyroid elevation noted. More frequent coughing today than normal. Pt will need skilled ST to continue to maximize swallowing ability. Modified barium swallow scheduled Friday 04-03-15, and further ST will be determined based upon the outcome of that evaluation.   Speech Therapy Frequency 3x / week   Duration 1 week  3 weeks   Treatment/Interventions Aspiration precaution training;Pharyngeal strengthening exercises;Trials of upgraded texture/liquids;Compensatory techniques;NMES   Potential to Achieve Goals Fair   Potential Considerations Severity of impairments;Previous level of function        Problem List Patient Active Problem List   Diagnosis Date Noted  . Oropharyngeal dysphagia   . Dysphagia   . Protein-calorie malnutrition, moderate ([place]) 12/03/2014  . Hypokalemia 12/02/2014  . Hypomagnesemia 12/02/2014  . Bronchiectasis with Pseudomonas without acute exacerbation ([place]) 12/01/2014  . Chronic antibiotic suppression - Azithromycin for chronic bronchiectasis with Pseudomonas 12/01/2014  . HOH (hard of hearing)   . COPD (chronic obstructive pulmonary disease) ([place])   . TIA (transient ischemic attack)   . GERD  (gastroesophageal reflux disease)   . Slurring of speech 11/30/2014  . Colovaginal fistula s/p robotic colectomy/repair 11/28/2014 11/28/2014  . Recurrent aspiration pneumonia ([place]) 12/26/2013  . Involutional osteoporosis 05/28/2013  . Lumbosacral spondylosis 05/01/2013  . Cranial arteritis ([place]) 02/05/2013  . Cornea scar 02/05/2013  . 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 Temporal arteritis ([place]) 02/05/2013    Riven Beebe , MS, [place]  04/01/2015, 10:59 AM  [facility] [address] [place], [place], [address] Phone: [phone]   Fax:  [phone]   Name: Julie Richardson MRN:  [id number] Date of Birth: 1925-12-09

## 2015-04-03 ENCOUNTER — Telehealth: Payer: Self-pay

## 2015-04-03 ENCOUNTER — Ambulatory Visit (HOSPITAL_COMMUNITY)
Admission: RE | Admit: 2015-04-03 | Discharge: 2015-04-03 | Disposition: A | Discharge status: Home / Self Care | Payer: Medicare Other | Source: Ambulatory Visit | Attending: Internal Medicine | Admitting: Internal Medicine

## 2015-04-03 ENCOUNTER — Ambulatory Visit (HOSPITAL_COMMUNITY)
Admission: RE | Admit: 2015-04-03 | Discharge: 2015-04-03 | Disposition: A | Discharge status: Home / Self Care | Payer: Medicare Other | Source: Ambulatory Visit | Attending: Family Medicine | Admitting: Family Medicine

## 2015-04-03 DIAGNOSIS — R933 Abnormal findings on diagnostic imaging of other parts of digestive tract: Secondary | ICD-10-CM | POA: Diagnosis not present

## 2015-04-03 DIAGNOSIS — R1313 Dysphagia, pharyngeal phase: Secondary | ICD-10-CM | POA: Diagnosis present

## 2015-04-03 DIAGNOSIS — Z931 Gastrostomy status: Secondary | ICD-10-CM | POA: Diagnosis not present

## 2015-04-03 DIAGNOSIS — R1310 Dysphagia, unspecified: Secondary | ICD-10-CM

## 2015-04-03 NOTE — Progress Notes (Signed)
Speech Pathology   MBSS complete. Full report located under chart review in imaging section. Recommend: continue NPO with ice chips PRN and [place] Levels free water protocol    Breck Coons [place] M.Ed [employer] [phone]

## 2015-04-03 NOTE — Telephone Encounter (Signed)
Discussed with Aurther Loft results of pt's modified barium swallow exam (MBSS) earlier today which showed very little change from last (MBSS) in November, and resulted in the same recommendation of NPO with water <30 minutes after thorough oral care. Since October 2016, pt reportedly had swallow therapy at the ALF until arriving at this facility in mid-December. She then had ~8 weeks (22 sessions) of ST using NMES here. This SLP recommended [facility] for further treatment, as had been discussed with pt/Terry earlier in this course of tx. SLP told Aurther Loft to tell pt she should cont with the HEP until that appointment. SLP provided telephone number for that facility.  Aurther Loft demo'd understanding that pt would be discharged at this time and expressed appreciation for the swallowing therapy his mother received here.

## 2015-04-06 ENCOUNTER — Ambulatory Visit: Payer: Medicare Other

## 2015-04-09 NOTE — Therapy (Signed)
[facility] [address] [place], [place], [address] Phone: [phone]   Fax:  [phone]  Patient Details  Name: Julie Richardson MRN: [id number] Date of Birth: 04-09-1925 Referring Provider:  No ref. provider found  Encounter Date: 04/09/2015  SPEECH THERAPY DISCHARGE SUMMARY  Visits from Start of Care: 23  Current functional level related to goals / functional outcomes: Goal update below for 23/25 ST sessions targeting swallowing ability with pt's HEP and by using NMES with ice chips and effortful swallows. SLP assessed pt's procedure with effortful swallow, HEP and assessed pt fatigue level each session. Pt had follow up modified barium swallow on 04-03-15 indicating swallow ability essentially unchanged after 23 sessions. SLP suggests referral to [facility] for evaluation and possible therapy.  SLP Short Term Goals - 03/13/15 1710     SLP SHORT TERM GOAL #1     Title  pt will demo thyroid elevation on 18/20 swallows     Status  Achieved     SLP SHORT TERM GOAL #2     Title  pt will demo effortful swallow/pronounced thyroid elevation with PO ice chips and without resulting hydrophonic voice in 30 swallows 50% success     Status  Achieved     SLP SHORT TERM GOAL #3     Title  pt will complete 100 swallows a session during 5 sessions     Status  Achieved     SLP SHORT TERM GOAL #4     Title  pt will demo her HEP with rare min A     Status  Achieved                SLP Long Term Goals - 04/01/15 1057     SLP LONG TERM GOAL #1     Title  pt will demo her HEP with modified independence over three sessions     Status  Achieved     SLP LONG TERM GOAL #2     Title  pt will tell SLP 5 signs of aspiration PNA with rare min A     Status  Achieved     SLP LONG TERM GOAL #3     Title  pt will demo effortful swallow/pronounced thyroid elevation with PO ice chips and without resulting hydrophonic voice  in 50 swallows 75% success     Status  Achieved     SLP LONG TERM GOAL #4     Title  pt will demo effortful swallow/pronounced thyroid elevation with ice chips 90% of the time for first 25 minutes therapy     Time  1     Period  Weeks     Status  Not Met          Remaining deficits: Dysphagia as ID'd on modified barium swallow eval dated 04-03-15. Please see that report under "imaging" for details. NPO was the cont'd recommendation with water <30 minutes after thorough oral care.   Education / Equipment: HEP, s/s aspiration PNA  Plan: Patient agrees to discharge.  Patient goals were partially met. Patient is being discharged due to lack of progress.  ?????     SLP G-codes: Swallowing: Goal status (X0940) CK (40-59% impaired) Swallowing: Discharge status (H6808) CM (80-99% impaired)  Differing G-codes at 20th visit and at discharge are due to SLP assessment in clinic (during therapy session) is without objective diagnostic equipment and clinical judgment only. More exact measurement of swallow function was completed  with modified barium swallow eval (MBSS) 04-03-15, indicating CN. However, this SLP assumed this is typo, as usually all "C" measurements are the same with evaluation only notations, as with outpatient MBSS. Thus her assessment of impairment is from 80-99%.   [facility] ,[place], [place]   04/09/2015, 5:15 PM  [facility] [address] [place], [place], [address] Phone: [phone]   Fax:  [phone]

## 2015-07-08 ENCOUNTER — Other Ambulatory Visit (HOSPITAL_COMMUNITY): Payer: Self-pay | Admitting: Internal Medicine

## 2015-07-08 DIAGNOSIS — R1310 Dysphagia, unspecified: Secondary | ICD-10-CM

## 2015-07-09 ENCOUNTER — Other Ambulatory Visit (HOSPITAL_COMMUNITY): Payer: Self-pay | Admitting: Internal Medicine

## 2015-07-09 ENCOUNTER — Ambulatory Visit (HOSPITAL_COMMUNITY)
Admission: RE | Admit: 2015-07-09 | Discharge: 2015-07-09 | Disposition: A | Discharge status: Home / Self Care | Payer: Medicare Other | Source: Ambulatory Visit | Attending: Internal Medicine | Admitting: Internal Medicine

## 2015-07-09 DIAGNOSIS — R1310 Dysphagia, unspecified: Secondary | ICD-10-CM

## 2015-07-09 DIAGNOSIS — K9429 Other complications of gastrostomy: Secondary | ICD-10-CM | POA: Diagnosis not present

## 2015-07-09 MED ORDER — IOPAMIDOL (ISOVUE-300) INJECTION 61%
INTRAVENOUS | Status: AC
  Administered 2015-07-09: 20 mL
  Filled 2015-07-09: qty 50

## 2015-07-10 ENCOUNTER — Other Ambulatory Visit (HOSPITAL_COMMUNITY): Payer: Self-pay | Admitting: Internal Medicine

## 2015-07-10 DIAGNOSIS — R1310 Dysphagia, unspecified: Secondary | ICD-10-CM

## 2015-07-11 NOTE — Telephone Encounter (Signed)
Unable to be reached

## 2015-07-13 ENCOUNTER — Ambulatory Visit (HOSPITAL_COMMUNITY)
Admission: RE | Admit: 2015-07-13 | Discharge: 2015-07-13 | Disposition: A | Discharge status: Home / Self Care | Payer: Medicare Other | Source: Ambulatory Visit | Attending: Internal Medicine | Admitting: Internal Medicine

## 2015-07-13 DIAGNOSIS — Z431 Encounter for attention to gastrostomy: Secondary | ICD-10-CM | POA: Insufficient documentation

## 2015-07-13 DIAGNOSIS — R1310 Dysphagia, unspecified: Secondary | ICD-10-CM

## 2015-10-14 ENCOUNTER — Ambulatory Visit (HOSPITAL_COMMUNITY)
Admission: RE | Admit: 2015-10-14 | Discharge: 2015-10-14 | Disposition: A | Discharge status: Home / Self Care | Payer: Medicare Other | Source: Ambulatory Visit | Attending: Internal Medicine | Admitting: Internal Medicine

## 2015-10-14 ENCOUNTER — Other Ambulatory Visit (HOSPITAL_COMMUNITY): Payer: Self-pay | Admitting: Internal Medicine

## 2015-10-14 ENCOUNTER — Encounter (HOSPITAL_COMMUNITY): Payer: Self-pay | Admitting: Interventional Radiology

## 2015-10-14 DIAGNOSIS — R1310 Dysphagia, unspecified: Secondary | ICD-10-CM

## 2015-10-14 DIAGNOSIS — Z431 Encounter for attention to gastrostomy: Secondary | ICD-10-CM | POA: Insufficient documentation

## 2015-10-14 HISTORY — PX: IR GENERIC HISTORICAL: IMG1180011

## 2015-10-14 MED ORDER — IOPAMIDOL (ISOVUE-300) INJECTION 61%
INTRAVENOUS | Status: AC
  Administered 2015-10-14: 15 mL
  Filled 2015-10-14: qty 50

## 2016-01-19 ENCOUNTER — Other Ambulatory Visit (HOSPITAL_COMMUNITY): Payer: Self-pay | Admitting: Internal Medicine

## 2016-01-19 DIAGNOSIS — R1310 Dysphagia, unspecified: Secondary | ICD-10-CM

## 2016-01-20 ENCOUNTER — Other Ambulatory Visit (HOSPITAL_COMMUNITY): Payer: Self-pay | Admitting: Internal Medicine

## 2016-01-20 ENCOUNTER — Encounter (HOSPITAL_COMMUNITY): Payer: Self-pay | Admitting: Interventional Radiology

## 2016-01-20 ENCOUNTER — Ambulatory Visit (HOSPITAL_COMMUNITY)
Admission: RE | Admit: 2016-01-20 | Discharge: 2016-01-20 | Disposition: A | Discharge status: Home / Self Care | Payer: Medicare Other | Source: Ambulatory Visit | Attending: Internal Medicine | Admitting: Internal Medicine

## 2016-01-20 DIAGNOSIS — K9423 Gastrostomy malfunction: Secondary | ICD-10-CM | POA: Insufficient documentation

## 2016-01-20 DIAGNOSIS — R1310 Dysphagia, unspecified: Secondary | ICD-10-CM | POA: Diagnosis present

## 2016-01-20 HISTORY — PX: IR GENERIC HISTORICAL: IMG1180011

## 2016-01-20 MED ORDER — IOPAMIDOL (ISOVUE-300) INJECTION 61%
INTRAVENOUS | Status: AC
  Administered 2016-01-20: 10 mL
  Filled 2016-01-20: qty 50

## 2016-01-20 MED ORDER — LIDOCAINE VISCOUS 2 % MT SOLN
OROMUCOSAL | Status: AC
  Filled 2016-01-20: qty 15

## 2016-02-06 ENCOUNTER — Emergency Department (HOSPITAL_BASED_OUTPATIENT_CLINIC_OR_DEPARTMENT_OTHER): Payer: Medicare Other

## 2016-02-06 ENCOUNTER — Encounter (HOSPITAL_BASED_OUTPATIENT_CLINIC_OR_DEPARTMENT_OTHER): Payer: Self-pay | Admitting: *Deleted

## 2016-02-06 ENCOUNTER — Emergency Department (HOSPITAL_BASED_OUTPATIENT_CLINIC_OR_DEPARTMENT_OTHER)
Admission: EM | Admit: 2016-02-06 | Discharge: 2016-02-06 | Disposition: A | Discharge status: Home / Self Care | Payer: Medicare Other | Attending: Emergency Medicine | Admitting: Emergency Medicine

## 2016-02-06 DIAGNOSIS — M316 Other giant cell arteritis: Secondary | ICD-10-CM | POA: Diagnosis not present

## 2016-02-06 DIAGNOSIS — J449 Chronic obstructive pulmonary disease, unspecified: Secondary | ICD-10-CM | POA: Insufficient documentation

## 2016-02-06 DIAGNOSIS — E1122 Type 2 diabetes mellitus with diabetic chronic kidney disease: Secondary | ICD-10-CM | POA: Insufficient documentation

## 2016-02-06 DIAGNOSIS — Z7984 Long term (current) use of oral hypoglycemic drugs: Secondary | ICD-10-CM | POA: Diagnosis not present

## 2016-02-06 DIAGNOSIS — Z79899 Other long term (current) drug therapy: Secondary | ICD-10-CM | POA: Diagnosis not present

## 2016-02-06 DIAGNOSIS — R6884 Jaw pain: Secondary | ICD-10-CM | POA: Diagnosis present

## 2016-02-06 DIAGNOSIS — N183 Chronic kidney disease, stage 3 (moderate): Secondary | ICD-10-CM | POA: Insufficient documentation

## 2016-02-06 LAB — C-REACTIVE PROTEIN: CRP: 5.6 mg/dL — AB (ref <1.0)

## 2016-02-06 LAB — CBC WITH DIFFERENTIAL/PLATELET
Basophils Absolute: 0 10*3/uL (ref 0.0–0.1)
Basophils Relative: 0 %
Eosinophils Absolute: 0 10*3/uL (ref 0.0–0.7)
Eosinophils Relative: 0 %
HEMATOCRIT: 28 % — AB (ref 36.0–46.0)
HEMOGLOBIN: 9 g/dL — AB (ref 12.0–15.0)
LYMPHS ABS: 1.2 10*3/uL (ref 0.7–4.0)
LYMPHS PCT: 18 %
MCH: 32.7 pg (ref 26.0–34.0)
MCHC: 32.1 g/dL (ref 30.0–36.0)
MCV: 101.8 fL — AB (ref 78.0–100.0)
MONO ABS: 0.6 10*3/uL (ref 0.1–1.0)
MONOS PCT: 9 %
NEUTROS ABS: 4.8 10*3/uL (ref 1.7–7.7)
NEUTROS PCT: 73 %
Platelets: 157 10*3/uL (ref 150–400)
RBC: 2.75 MIL/uL — ABNORMAL LOW (ref 3.87–5.11)
RDW: 14.6 % (ref 11.5–15.5)
WBC: 6.6 10*3/uL (ref 4.0–10.5)

## 2016-02-06 LAB — BASIC METABOLIC PANEL
Anion gap: 8 (ref 5–15)
BUN: 22 mg/dL — AB (ref 6–20)
CALCIUM: 8.6 mg/dL — AB (ref 8.9–10.3)
CHLORIDE: 89 mmol/L — AB (ref 101–111)
CO2: 30 mmol/L (ref 22–32)
CREATININE: 0.92 mg/dL (ref 0.44–1.00)
GFR calc non Af Amer: 54 mL/min — ABNORMAL LOW (ref >60)
GLUCOSE: 133 mg/dL — AB (ref 65–99)
Potassium: 4.4 mmol/L (ref 3.5–5.1)
Sodium: 127 mmol/L — ABNORMAL LOW (ref 135–145)

## 2016-02-06 LAB — SEDIMENTATION RATE: Sed Rate: 103 mm/hr — ABNORMAL HIGH (ref 0–22)

## 2016-02-06 MED ORDER — PREDNISONE 20 MG PO TABS: 60.0000 mg | ORAL_TABLET | Freq: Every day | ORAL | 0 refills | Status: AC

## 2016-02-06 MED ORDER — METHYLPREDNISOLONE SODIUM SUCC 125 MG IJ SOLR
50.0000 mg | Freq: Once | INTRAMUSCULAR | Status: AC
  Administered 2016-02-06: 50 mg via INTRAVENOUS
  Filled 2016-02-06: qty 2

## 2016-02-06 MED ORDER — IOPAMIDOL (ISOVUE-300) INJECTION 61%
75.0000 mL | Freq: Once | INTRAVENOUS | Status: AC | PRN
  Administered 2016-02-06: 100 mL via INTRAVENOUS

## 2016-02-06 NOTE — Discharge Instructions (Signed)
EXPECT A CALL FROM [facility] AND DR. Cardell PeachGAY FOR A FOLLOW UP ON Monday.  DR. Cardell PeachGAY likes you to start taking 60 mg prednisone daily starting tomorrow.  Go to the closest HOSPITAL ER if you have worsening vision on the right eye.  Also please return to the ER if your symptoms worsen; if you have fevers, chills, inability to keep any medications down, confusion. Otherwise see the outpatient doctor as requested.

## 2016-02-06 NOTE — ED Triage Notes (Signed)
Patient c/o right jaw swelling and pain that started approx one week ago. She was given tylenol at [facility] during the week for pain via her feeding tube

## 2016-02-06 NOTE — ED Provider Notes (Signed)
[facility] Provider Note   CSN: [id number] Arrival date & time: 02/06/16  1142     History   Chief Complaint Chief Complaint  Patient presents with  . Jaw Pain    HPI Julie Richardson is a 82 y.o. female.  HPI 82 y/o with hx of temporal arteritis with vision loss on the L eye comes in with cc of jaw pain. Pt reports that she started having jaw pain 2-3 days ago. Pain is intermittent and responding to the tylenol. This AM she woke up with L sided jaw pain and swelling. Pt has no dental pain, but she has a PEG tube and doesn't chew food. Pt has no n/v/f/c. She has no vision complains on the R side.   Past Medical History:  Diagnosis Date  . Anemia   . Arthritis   . Atrial fibrillation (HCC)    hx of  . Blind left eye   . Chronic kidney disease    stage 3  . Complication of anesthesia    slow to wake up  . COPD (chronic obstructive pulmonary disease) (HCC)    pseodonomonus bronchiectasis  . Diabetes mellitus without complication ([place])   . Dysphagia    uses thickened liquids  . GERD (gastroesophageal reflux disease)   . History of home oxygen therapy    uses 2 liters at hs and prn during day  . HOH (hard of hearing)   . HOH (hard of hearing)   . Hyperlipidemia   . Pelvis fracture ([place]) 6 years ago  . Pneumonia oct 2015   ASPIRATION PNEUMONIA X 3- 4 TIMES IN PAST  . Spinal stenosis   . TIA (transient ischemic attack)    7 YEARS AGO, NONE SINCE    Patient Active Problem List   Diagnosis Date Noted  . Oropharyngeal dysphagia   . Dysphagia   . Protein-calorie malnutrition, moderate ([place]) 12/03/2014  . Hypokalemia 12/02/2014  . Hypomagnesemia 12/02/2014  . Bronchiectasis with Pseudomonas without acute exacerbation ([place]) 12/01/2014  . Chronic antibiotic suppression - Azithromycin for chronic bronchiectasis with Pseudomonas 12/01/2014  . HOH (hard of hearing)   . COPD (chronic obstructive pulmonary disease) ([place])   . TIA (transient ischemic attack)   .  GERD (gastroesophageal reflux disease)   . Slurring of speech 11/30/2014  . Colovaginal fistula s/p robotic colectomy/repair 11/28/2014 11/28/2014  . Recurrent aspiration pneumonia ([place]) 12/26/2013  . Involutional osteoporosis 05/28/2013  . Lumbosacral spondylosis 05/01/2013  . Cranial arteritis ([place]) 02/05/2013  . Cornea scar 02/05/2013  . 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 Temporal arteritis ([place]) 02/05/2013    Past Surgical History:  Procedure Laterality Date  . ABDOMINAL HYSTERECTOMY    . IR GENERIC HISTORICAL  10/14/2015   IR CATHETER TUBE CHANGE 10/14/2015 Marybelle Killings, MD [facility]  . IR GENERIC HISTORICAL  01/20/2016   IR [facility] GASTRO/COLONIC TUBE PERCUT W/FLUORO 01/20/2016 Aletta Edouard, MD [facility]  . right shoulder surgery  years ago   for fracture  . TONSILLECTOMY  as child    OB History    No data available       Home Medications    Prior to Admission medications   Medication Sig Start Date End Date Taking? Authorizing Provider  levofloxacin (LEVAQUIN) 500 MG tablet Take 500 mg by mouth daily.   Yes Historical Provider, MD  levothyroxine (SYNTHROID, LEVOTHROID) 25 MCG tablet Take 25 mcg by mouth daily before breakfast.   Yes Historical Provider, MD  saccharomyces boulardii (FLORASTOR) 250 MG capsule  Take 250 mg by mouth daily.   Yes Historical Provider, MD  acetaminophen (TYLENOL) 325 MG tablet Take 650 mg by mouth 2 (two) times daily.     Historical Provider, MD  amiodarone (PACERONE) 200 MG tablet Take 1 tablet (200 mg total) by mouth daily. 12/10/14   Michael Boston, MD  Ascorbic Acid (VITAMIN C) 1000 MG tablet Take 1,000 mg by mouth 2 (two) times daily.    Historical Provider, MD  atorvastatin (LIPITOR) 20 MG tablet Take 20 mg by mouth at bedtime.     Historical Provider, MD  azithromycin (ZITHROMAX) 250 MG tablet Take 250 mg by mouth daily. For chronic obstructive pulmonary disease    Historical Provider, MD  bisacodyl (DULCOLAX) 5 MG EC tablet  Take 5 mg by mouth 2 (two) times daily.    Historical Provider, MD  cholecalciferol (VITAMIN D) 1000 UNITS tablet Take 2,000 Units by mouth daily.    Historical Provider, MD  cycloSPORINE (RESTASIS) 0.05 % ophthalmic emulsion Place 1 drop into both eyes 2 (two) times daily.    Historical Provider, MD  erythromycin ophthalmic ointment Place 1 application into both eyes at bedtime.    Historical Provider, MD  hydrocortisone (ANUSOL-HC) 2.5 % rectal cream Place 1 application rectally as needed for hemorrhoids or itching.    Historical Provider, MD  hydroxypropyl methylcellulose / hypromellose (ISOPTO TEARS / GONIOVISC) 2.5 % ophthalmic solution Place 1 drop into both eyes as needed for dry eyes.    Historical Provider, MD  ipratropium-albuterol (DUONEB) 0.5-2.5 (3) MG/3ML SOLN Take 3 mLs by nebulization 3 (three) times daily.    Historical Provider, MD  loperamide (IMODIUM) 2 MG capsule Take 1 capsule (2 mg total) by mouth as needed for diarrhea or loose stools (Do not exceed more than 2 doses a day without d/w MD). 12/10/14   Michael Boston, MD  Nutritional Supplements (FEEDING SUPPLEMENT, JEVITY 1.2 CAL,) LIQD Place 237 mLs into feeding tube 4 (four) times daily. 12/10/14   Michael Boston, MD  omeprazole (PRILOSEC) 20 MG capsule Take 20 mg by mouth daily.    Historical Provider, MD  Polyethyl Glycol-Propyl Glycol (LUBRICANT EYE DROPS) 0.4-0.3 % SOLN Apply 1 drop to eye as needed (for dry eyes).    Historical Provider, MD  polyethylene glycol (MIRALAX / GLYCOLAX) packet Take 17 g by mouth daily.    Historical Provider, MD  predniSONE (DELTASONE) 20 MG tablet Take 3 tablets (60 mg total) by mouth daily. 02/07/16   Varney Biles, MD  predniSONE (DELTASONE) 5 MG tablet Take 5 mg by mouth daily with breakfast.    Historical Provider, MD  sotalol (BETAPACE) 80 MG tablet Take 80 mg by mouth 2 (two) times daily.    Historical Provider, MD  traMADol (ULTRAM) 50 MG tablet Take 1-2 tablets (50-100 mg total) by mouth  every 6 (six) hours as needed for moderate pain or severe pain. 11/28/14   Michael Boston, MD    Family History No family history on file.  Social History Social History  Substance Use Topics  . Smoking status: Never Smoker  . Smokeless tobacco: Never Used  . Alcohol use No     Allergies   Coumadin [warfarin sodium] and Sulfa antibiotics   Review of Systems Review of Systems  ROS 10 Systems reviewed and are negative for acute change except as noted in the HPI.     Physical Exam Updated Vital Signs BP (!) 97/48 (BP Location: Right Arm)   Pulse 65   Temp 98.3 F (  36.8 C) (Oral)   Resp 18   SpO2 90%   Physical Exam  Constitutional: She is oriented to person, place, and time. She appears well-developed.  HENT:  Head: Normocephalic and atraumatic.  Right sided maxillary swelling. Pt has tenderness with palpation. No palpable mass. Dental exam reveals relatively normal dentition and no signs of abscess.  No tenderness with palpation of the temporal region.    Eyes:  R eye muscle movement is normal, gross vision exam is normal  Neck: Normal range of motion. Neck supple.  Cardiovascular: Normal rate.   Pulmonary/Chest: Effort normal and breath sounds normal. No respiratory distress.  Abdominal: Soft. Bowel sounds are normal. She exhibits no distension. There is no tenderness. There is no rebound and no guarding.  Neurological: She is alert and oriented to person, place, and time.  Skin: Skin is warm and dry.  Nursing note and vitals reviewed.    ED Treatments / Results  Labs (all labs ordered are listed, but only abnormal results are displayed) Labs Reviewed  CBC WITH DIFFERENTIAL/PLATELET - Abnormal; Notable for the following:       Result Value   RBC 2.75 (*)    Hemoglobin 9.0 (*)    HCT 28.0 (*)    MCV 101.8 (*)    All other components within normal limits  BASIC METABOLIC PANEL - Abnormal; Notable for the following:    Sodium 127 (*)    Chloride 89  (*)    Glucose, Bld 133 (*)    BUN 22 (*)    Calcium 8.6 (*)    GFR calc non Af Amer 54 (*)    All other components within normal limits  SEDIMENTATION RATE - Abnormal; Notable for the following:    Sed Rate 103 (*)    All other components within normal limits  C-REACTIVE PROTEIN    EKG  EKG Interpretation None       Radiology Ct Maxillofacial W Contrast  Result Date: 02/06/2016 CLINICAL DATA:  Swelling in lower part of right cheek. Redness, pain for 2-3 days. EXAM: CT MAXILLOFACIAL WITH CONTRAST TECHNIQUE: Multidetector CT imaging of the maxillofacial structures was performed. Multiplanar CT image reconstructions were also generated. A small metallic BB was placed on the right temple in order to reliably differentiate right from left. COMPARISON:  None. FINDINGS: Osseous: No fracture or mandibular dislocation. No destructive process. Orbits: Negative. No traumatic or inflammatory finding. Sinuses: Extensive sinusitis changes. Complete opacification of the maxillary sinuses and sphenoid sinuses bilaterally. Near complete opacification of ethmoid air cells and frontal sinuses. Mastoid air cells are clear. Soft tissues: No visible significant soft tissue swelling. No soft tissue fluid collection/abscess. Limited intracranial: No significant or unexpected finding. IMPRESSION: No acute findings. Extensive chronic sinusitis changes throughout the paranasal sinuses. Electronically Signed   By: Rolm Baptise M.D.   On: 02/06/2016 14:05    Procedures Procedures (including critical care time)  Medications Ordered in ED Medications  iopamidol (ISOVUE-300) 61 % injection 75 mL (100 mLs Intravenous Contrast Given 02/06/16 1339)  methylPREDNISolone sodium succinate (SOLU-MEDROL) 125 mg/2 mL injection 50 mg (50 mg Intravenous Given 02/06/16 1534)     Initial Impression / Assessment and Plan / ED Course  I have reviewed the triage vital signs and the nursing notes.  Pertinent labs & imaging  results that were available during my care of the patient were reviewed by me and considered in my medical decision making (see chart for details).  Clinical Course as of Feb 06 1655  Sat Feb 06, 2016  1457 CT is neg for any acute process. Sed rate is elevated. Pt on repeat eval reports no vision complains. She has no arterial symptoms over the temporal region. Pt doesn't chew - so she has no claudication. Simple chewing motion here is not making her symptoms worse.  I spoke with Dr. Katy Fitch - Optho. He would prefer pt's established optho is made aware - so I have called [facility] at [place] and left a message. I have also called [facility] and spoke with on call team for Rheumatology. Pt's last sed rate was 24 on 11/13/15. I spoke with Ms. Gilda Crease, PA-C, and she recommends giving iv equivalent of 60 mg prednisone now, and d/c with meds for Sun[place] and Monday, and they will see the patient on Monday. Results from the ER workup discussed with the patient face to face and all questions answered to the best of my ability.  CT Maxillofacial W Contrast [AN]  [id number] Spoke with Dr. Adella Nissen at [facility] eye. He is a corneal speacialist taking care of patient, and has taken the information of the patient for a prompt f/u.  [AN]  1655 Unfortunately  -the visual acuity test that was ordered was not completed. The eye specialist had informed me that vision at their clinic is 20/40.   [AN]    Clinical Course User Index [AN] Varney Biles, MD    Pt with hx of temporal arteritis comes in with cc of jaw pain, L sided. Jaw pain is intermittent, there is new swelling on the L side.  Will get a screening ESR - as temporal arteritis is known to present with jaw pain, but the pre-test probability is low mainly because she is on some prednisone.   CT max face ordered. There could be parotitis/abscess.  Final Clinical Impressions(s) / ED Diagnoses   Final diagnoses:  Temporal arteritis [facility])    New  Prescriptions Discharge Medication List as of 02/06/2016  3:51 PM    START taking these medications   Details  !! predniSONE (DELTASONE) 20 MG tablet Take 3 tablets (60 mg total) by mouth daily., Starting Sun 02/07/2016, Print     !! - Potential duplicate medications found. Please discuss with provider.       Varney Biles, MD 02/06/16 [phone]

## 2016-03-21 ENCOUNTER — Ambulatory Visit (HOSPITAL_COMMUNITY)
Admission: RE | Admit: 2016-03-21 | Discharge: 2016-03-21 | Disposition: A | Discharge status: Home / Self Care | Payer: Medicare Other | Source: Ambulatory Visit | Attending: Family Medicine | Admitting: Family Medicine

## 2016-03-21 ENCOUNTER — Other Ambulatory Visit (HOSPITAL_COMMUNITY): Payer: Self-pay | Admitting: Family Medicine

## 2016-03-21 ENCOUNTER — Encounter (HOSPITAL_COMMUNITY): Payer: Self-pay | Admitting: Interventional Radiology

## 2016-03-21 DIAGNOSIS — Z431 Encounter for attention to gastrostomy: Secondary | ICD-10-CM | POA: Diagnosis present

## 2016-03-21 DIAGNOSIS — R633 Feeding difficulties, unspecified: Secondary | ICD-10-CM

## 2016-03-21 HISTORY — PX: IR GENERIC HISTORICAL: IMG1180011

## 2016-03-21 MED ORDER — IOPAMIDOL (ISOVUE-300) INJECTION 61%
INTRAVENOUS | Status: AC
  Administered 2016-03-21: 10 mL
  Filled 2016-03-21: qty 50

## 2016-03-21 MED ORDER — LIDOCAINE VISCOUS 2 % MT SOLN
OROMUCOSAL | Status: AC
  Filled 2016-03-21: qty 15

## 2016-06-13 ENCOUNTER — Other Ambulatory Visit (HOSPITAL_COMMUNITY): Payer: Self-pay | Admitting: Internal Medicine

## 2016-06-13 DIAGNOSIS — K9423 Gastrostomy malfunction: Secondary | ICD-10-CM

## 2016-06-14 ENCOUNTER — Ambulatory Visit (HOSPITAL_COMMUNITY)
Admission: RE | Admit: 2016-06-14 | Discharge: 2016-06-14 | Disposition: A | Discharge status: Home / Self Care | Payer: Medicare Other | Source: Ambulatory Visit | Attending: Internal Medicine | Admitting: Internal Medicine

## 2016-06-14 ENCOUNTER — Encounter (HOSPITAL_COMMUNITY): Payer: Self-pay | Admitting: Interventional Radiology

## 2016-06-14 DIAGNOSIS — K9423 Gastrostomy malfunction: Secondary | ICD-10-CM

## 2016-06-14 DIAGNOSIS — Z431 Encounter for attention to gastrostomy: Secondary | ICD-10-CM | POA: Insufficient documentation

## 2016-06-14 HISTORY — PX: IR REPLC GASTRO/COLONIC TUBE PERCUT W/FLUORO: IMG2333

## 2016-06-14 MED ORDER — IOPAMIDOL (ISOVUE-300) INJECTION 61%
INTRAVENOUS | Status: AC
  Administered 2016-06-14: 10 mL
  Filled 2016-06-14: qty 50

## 2016-06-14 MED ORDER — LIDOCAINE VISCOUS 2 % MT SOLN
OROMUCOSAL | Status: AC
  Filled 2016-06-14: qty 15

## 2016-07-13 ENCOUNTER — Other Ambulatory Visit (HOSPITAL_COMMUNITY): Payer: Self-pay | Admitting: Family Medicine

## 2016-07-13 DIAGNOSIS — K9423 Gastrostomy malfunction: Secondary | ICD-10-CM

## 2016-07-14 ENCOUNTER — Encounter (HOSPITAL_COMMUNITY): Payer: Self-pay | Admitting: Interventional Radiology

## 2016-07-14 ENCOUNTER — Ambulatory Visit (HOSPITAL_COMMUNITY)
Admission: RE | Admit: 2016-07-14 | Discharge: 2016-07-14 | Disposition: A | Discharge status: Home / Self Care | Payer: Medicare Other | Source: Ambulatory Visit | Attending: Family Medicine | Admitting: Family Medicine

## 2016-07-14 DIAGNOSIS — K9423 Gastrostomy malfunction: Secondary | ICD-10-CM | POA: Diagnosis present

## 2016-07-14 DIAGNOSIS — Y733 Surgical instruments, materials and gastroenterology and urology devices (including sutures) associated with adverse incidents: Secondary | ICD-10-CM | POA: Insufficient documentation

## 2016-07-14 HISTORY — PX: IR REPLC GASTRO/COLONIC TUBE PERCUT W/FLUORO: IMG2333

## 2016-07-14 MED ORDER — LIDOCAINE VISCOUS 2 % MT SOLN
OROMUCOSAL | Status: AC
  Filled 2016-07-14: qty 15

## 2016-07-14 MED ORDER — IOPAMIDOL (ISOVUE-300) INJECTION 61%
INTRAVENOUS | Status: AC
  Administered 2016-07-14: 10 mL
  Filled 2016-07-14: qty 50

## 2016-09-21 DEATH — deceased

## 2017-03-23 IMAGING — XA IR GI TUBE CONTRAST INJECTION
4 series · 7 of 7 positions shown · non-contrast
Comparison: none

INDICATION: RAXrench balloon retention gastrostomy tube, most recently exchanged
01/20/2016. Recurrent connecting problems to external feeding
device.

[Series 1: fl angio · 4 of 62 frames shown (1 of 4)]
[frame 10/62]
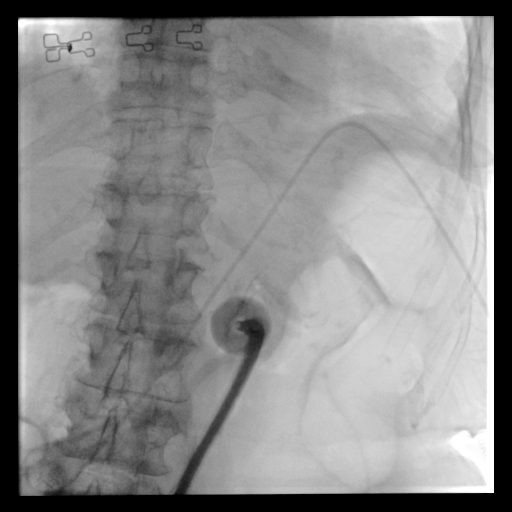
[frame 32/62]
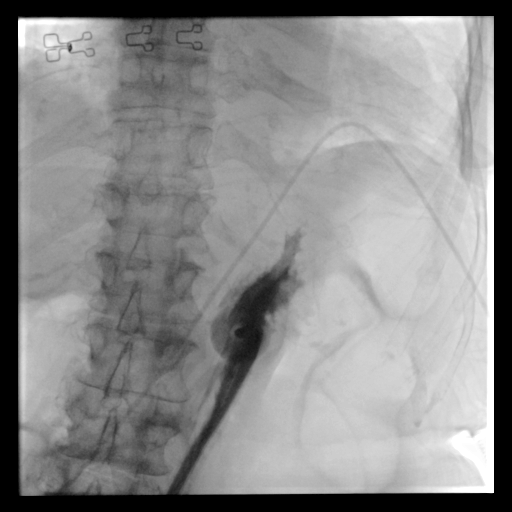
[frame 49/62]
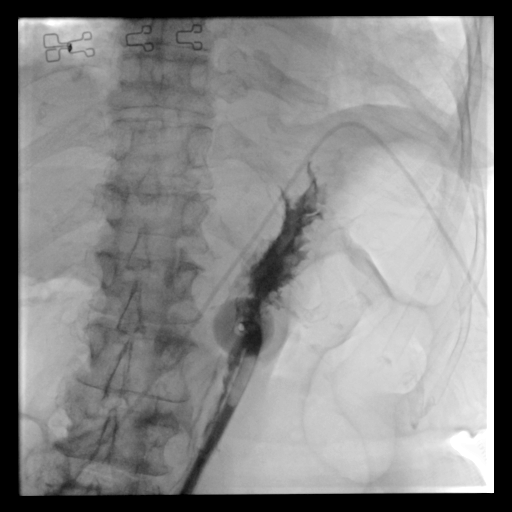
[frame 53/62]
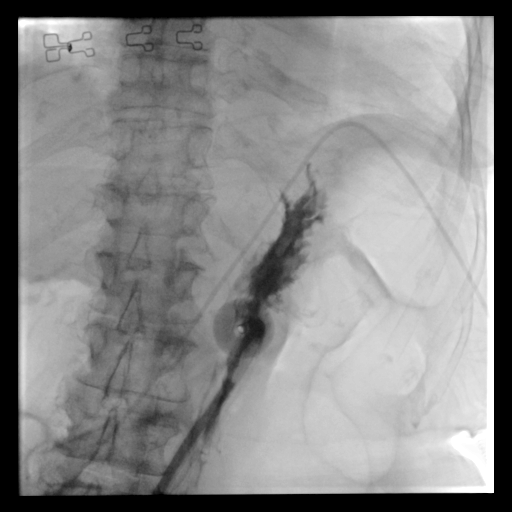

[Series 2: fl angio · 1 of 1 slices shown (2 of 4)]
[im 1/1]
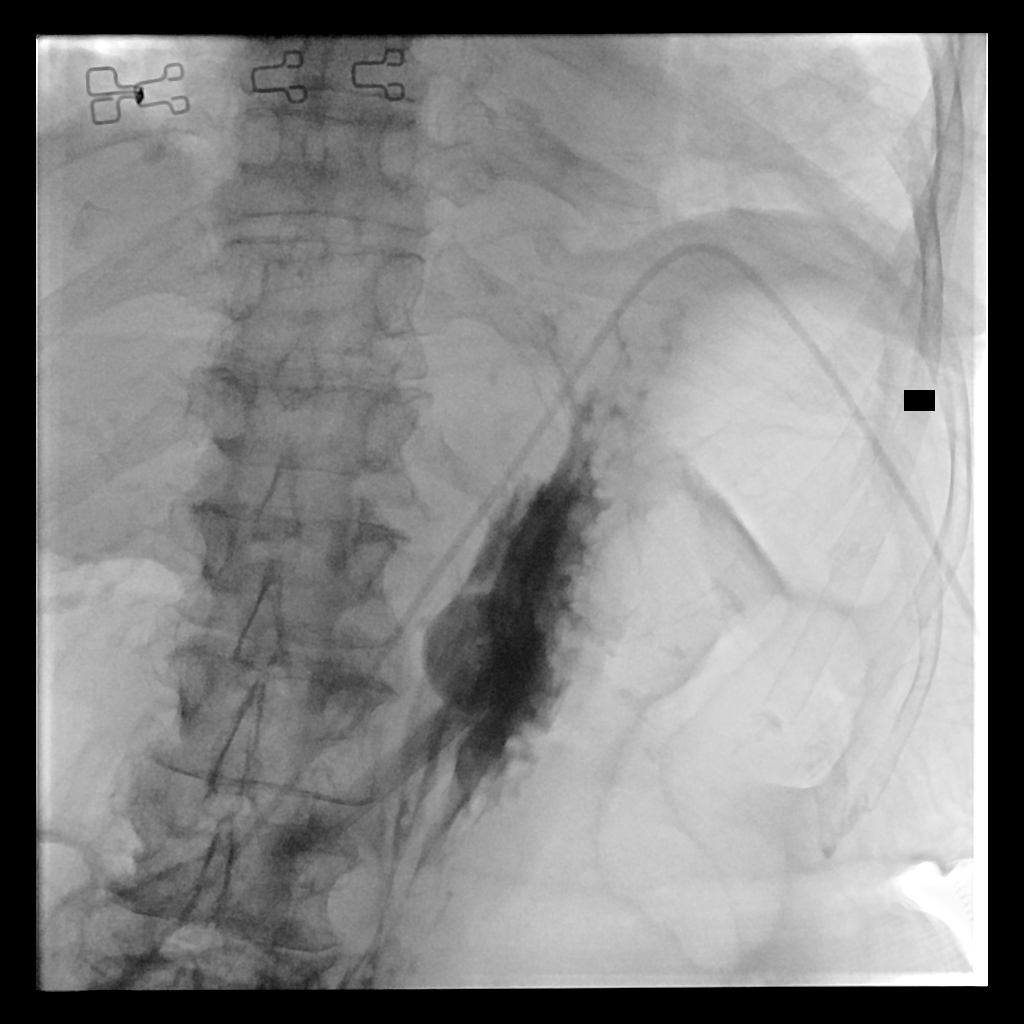

[Series 3: fl angio · 1 of 1 slices shown (3 of 4)]
[im 1/1]
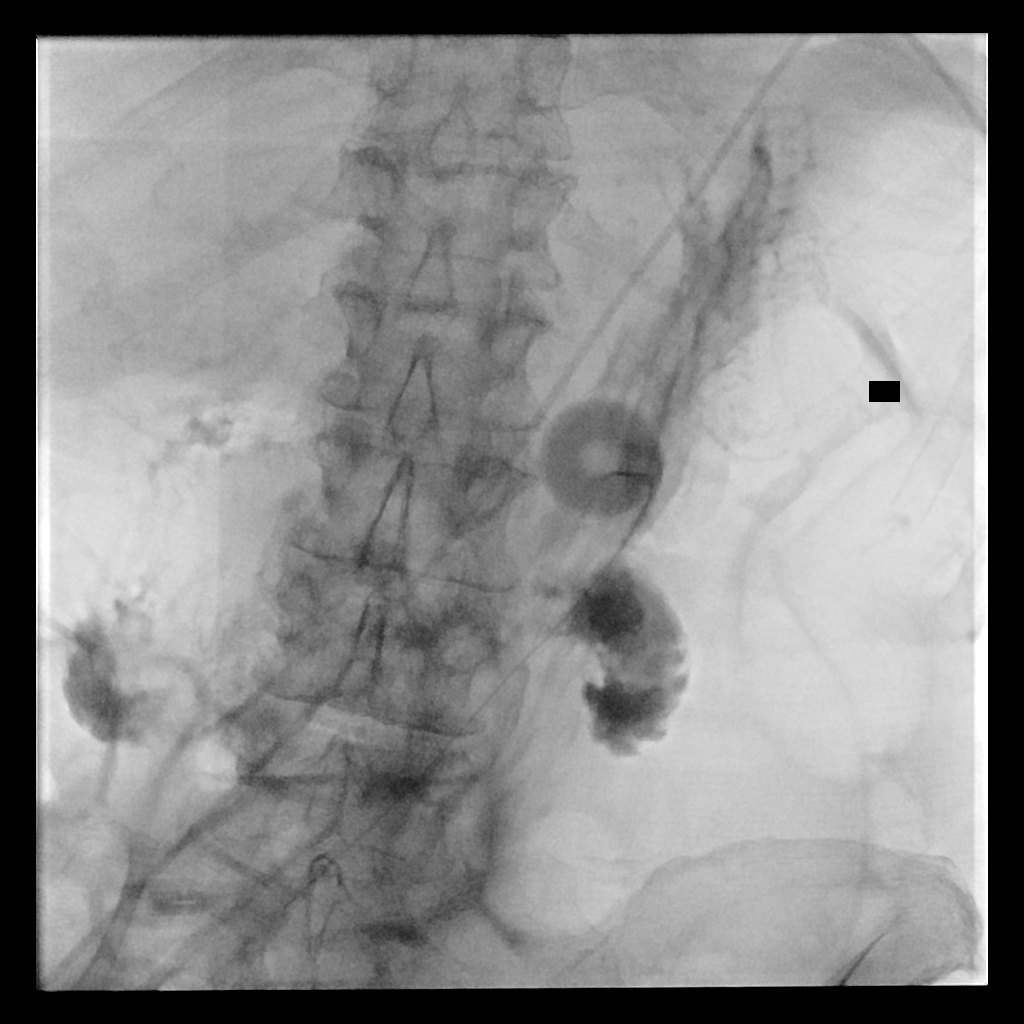

[Series 4: fl angio · 1 of 1 slices shown (4 of 4)]
[im 1/1]
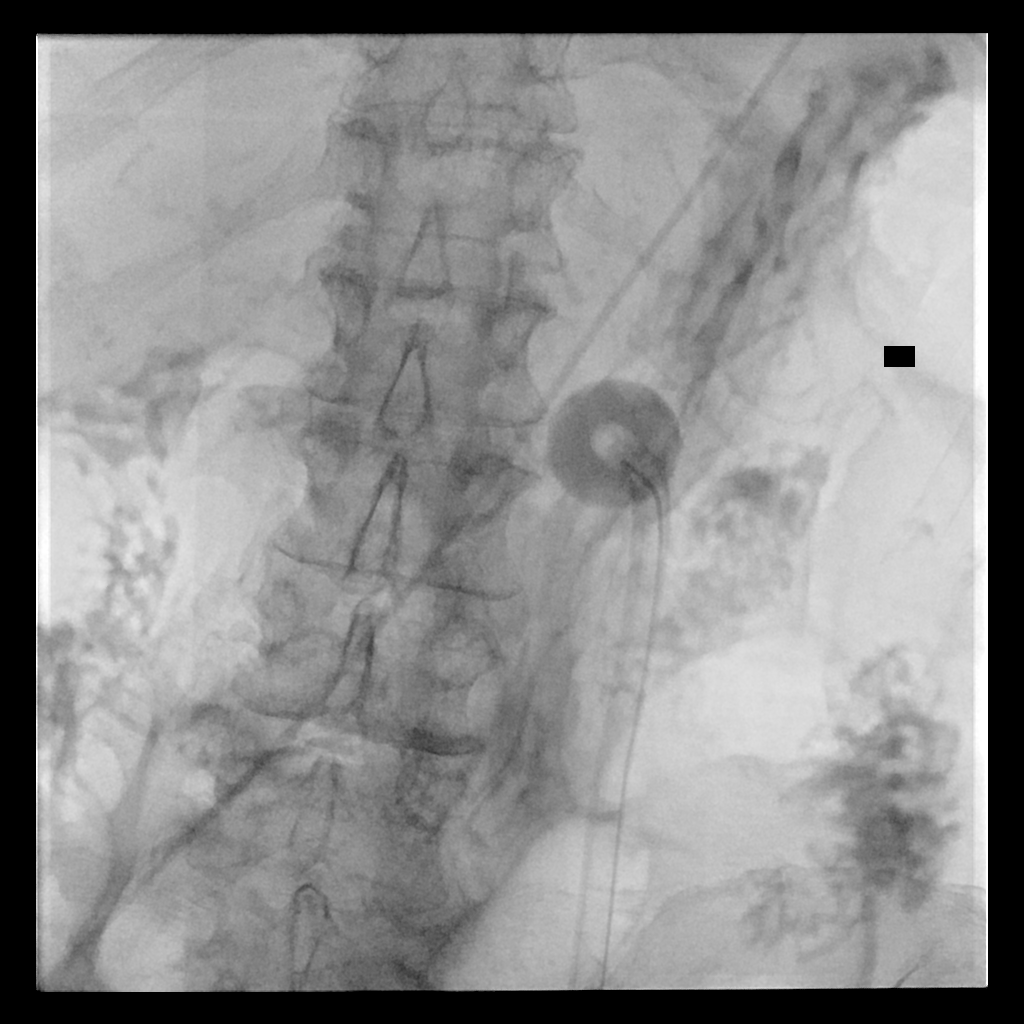

[7 of 7 positions shown; findings below may reference images not displayed]

EXAM:
GI TUBE INJECTION

CONTRAST:  15 mL Isovue - administered into the gastric lumen.

FLUOROSCOPY TIME:  1 minutes 54 seconds, 8 mGy

COMPLICATIONS:
None immediate.

PROCEDURE:
Informed written consent was obtained from the patient after a
thorough discussion of the procedural risks, benefits and
alternatives. All questions were addressed. Sterile Technique was
utilized . A timeout was performed prior to the initiation of the
procedure.

Inspection reveals normal healthy appearance at the catheter entry
site.

Injection demonstrates appropriate positioning of the retention
balloon, inflated within the gastric lumen. The stomach is
decompressed. No extravasation or leak.
IMPRESSION: Normal study, okay for routine use.

1.
